# Patient Record
Sex: Male | Born: 1935 | Race: White | Hispanic: No | Smoking: Former smoker
Health system: Southern US, Community
[De-identification: ages and names within clinical notes are randomized; demographics above are authoritative.]

## PROBLEM LIST (undated history)

## (undated) DIAGNOSIS — H9190 Unspecified hearing loss, unspecified ear: Secondary | ICD-10-CM

## (undated) DIAGNOSIS — IMO0002 Reserved for concepts with insufficient information to code with codable children: Secondary | ICD-10-CM

## (undated) DIAGNOSIS — D62 Acute posthemorrhagic anemia: Secondary | ICD-10-CM

## (undated) DIAGNOSIS — D649 Anemia, unspecified: Secondary | ICD-10-CM

## (undated) DIAGNOSIS — S72142A Displaced intertrochanteric fracture of left femur, initial encounter for closed fracture: Secondary | ICD-10-CM

## (undated) DIAGNOSIS — M48061 Spinal stenosis, lumbar region without neurogenic claudication: Secondary | ICD-10-CM

## (undated) DIAGNOSIS — R609 Edema, unspecified: Secondary | ICD-10-CM

## (undated) DIAGNOSIS — R7989 Other specified abnormal findings of blood chemistry: Secondary | ICD-10-CM

## (undated) DIAGNOSIS — N400 Enlarged prostate without lower urinary tract symptoms: Secondary | ICD-10-CM

## (undated) DIAGNOSIS — I517 Cardiomegaly: Secondary | ICD-10-CM

## (undated) DIAGNOSIS — M810 Age-related osteoporosis without current pathological fracture: Secondary | ICD-10-CM

## (undated) DIAGNOSIS — E079 Disorder of thyroid, unspecified: Secondary | ICD-10-CM

## (undated) DIAGNOSIS — E039 Hypothyroidism, unspecified: Secondary | ICD-10-CM

## (undated) DIAGNOSIS — I10 Essential (primary) hypertension: Secondary | ICD-10-CM

## (undated) HISTORY — DX: Other specified abnormal findings of blood chemistry: R79.89

## (undated) HISTORY — PX: FOOT SURGERY: SHX648

## (undated) HISTORY — DX: Acute posthemorrhagic anemia: D62

## (undated) HISTORY — DX: Edema, unspecified: R60.9

## (undated) HISTORY — DX: Anemia, unspecified: D64.9

## (undated) HISTORY — DX: Unspecified hearing loss, unspecified ear: H91.90

## (undated) HISTORY — DX: Hypothyroidism, unspecified: E03.9

## (undated) HISTORY — DX: Cardiomegaly: I51.7

## (undated) HISTORY — DX: Age-related osteoporosis without current pathological fracture: M81.0

## (undated) HISTORY — DX: Reserved for concepts with insufficient information to code with codable children: IMO0002

## (undated) HISTORY — DX: Spinal stenosis, lumbar region without neurogenic claudication: M48.061

---

## 2001-02-18 ENCOUNTER — Encounter: Payer: Self-pay | Admitting: Orthopaedic Surgery

## 2001-02-18 ENCOUNTER — Ambulatory Visit (HOSPITAL_COMMUNITY): Admission: RE | Admit: 2001-02-18 | Discharge: 2001-02-18 | Payer: Self-pay | Admitting: Orthopaedic Surgery

## 2001-02-28 ENCOUNTER — Encounter: Payer: Self-pay | Admitting: Orthopedic Surgery

## 2001-02-28 ENCOUNTER — Encounter: Admission: RE | Admit: 2001-02-28 | Discharge: 2001-02-28 | Payer: Self-pay | Admitting: Orthopedic Surgery

## 2001-03-11 ENCOUNTER — Encounter: Admission: RE | Admit: 2001-03-11 | Discharge: 2001-03-11 | Payer: Self-pay | Admitting: Orthopedic Surgery

## 2001-03-11 ENCOUNTER — Encounter: Payer: Self-pay | Admitting: Orthopedic Surgery

## 2001-04-20 ENCOUNTER — Encounter: Payer: Self-pay | Admitting: Orthopedic Surgery

## 2001-04-20 ENCOUNTER — Encounter: Admission: RE | Admit: 2001-04-20 | Discharge: 2001-04-20 | Payer: Self-pay | Admitting: Orthopedic Surgery

## 2004-08-20 ENCOUNTER — Ambulatory Visit (HOSPITAL_COMMUNITY): Admission: RE | Admit: 2004-08-20 | Discharge: 2004-08-20 | Payer: Self-pay | Admitting: Gastroenterology

## 2004-08-20 ENCOUNTER — Encounter (INDEPENDENT_AMBULATORY_CARE_PROVIDER_SITE_OTHER): Payer: Self-pay | Admitting: Specialist

## 2007-12-16 ENCOUNTER — Encounter: Admission: RE | Admit: 2007-12-16 | Discharge: 2008-01-18 | Payer: Self-pay | Admitting: Ophthalmology

## 2008-08-22 ENCOUNTER — Encounter: Admission: RE | Admit: 2008-08-22 | Discharge: 2008-08-22 | Payer: Self-pay | Admitting: Orthopedic Surgery

## 2010-10-08 ENCOUNTER — Other Ambulatory Visit: Payer: Self-pay | Admitting: Dermatology

## 2011-01-02 NOTE — Op Note (Signed)
[name], [name]                 ACCOUNT NO.:  [id number]   MEDICAL RECORD NO.:  [id number]          PATIENT TYPE:  AMB   LOCATION:  ENDO                         FACILITY:  [facility]   PHYSICIAN:  Danise Edge, M.D.   DATE OF BIRTH:  1918-07-15   DATE OF PROCEDURE:  08/20/2004  DATE OF DISCHARGE:                                 OPERATIVE REPORT   PROCEDURE INDICATION:  Mr. Cory Burton is an 77 year old male born Dec 13, 1917.  Mr. Cory Burton is scheduled to undergo his first screening colonoscopy  with polypectomy to prevent colon cancer.   ENDOSCOPIST:  Danise Edge, M.D.   PREMEDICATION:  1.  Versed 4 mg.  2.  Demerol 50 mg.   PROCEDURE:  After obtaining informed consent, Mr. Cory Burton was placed in the  left lateral decubitus position.  I administered intravenous Versed and  intravenous Demerol to achieve conscious sedation for the procedure.  The  patient's blood pressure, oxygen saturation, and cardiac rhythm were  monitored throughout the procedure and documented in the medical record.   Anal inspection and digital rectal exam were normal.  The prostate was non-  nodular.  The Olympus adjustable pediatric colonoscope was introduced into  the rectum and advanced to the cecum.  Colonic preparation for the exam  today was excellent.   Rectum:  Four diminutive hyperplastic-appearing polyps were removed with the  cold biopsy forceps from the mid-distal rectum.   Sigmoid colon and descending colon:  Extensive left colonic diverticulosis.   Splenic flexure:  Normal.   Transverse colon:  Normal.   Hepatic flexure:  Normal.   Ascending colon:  Normal.   Cecum and ileocecal valve:  Normal.   ASSESSMENT:  1.  Four diminutive hyperplastic-appearing polyps were removed from the      rectum.  2.  Extensive left colonic diverticulosis.      MJ/MEDQ  D:  08/20/2004  T:  08/20/2004  Job:  [id number]   cc:   Marcene Duos, M.D.  [place]  [place]  [place] [address]  Fax: [phone]

## 2011-12-01 DIAGNOSIS — L570 Actinic keratosis: Secondary | ICD-10-CM | POA: Diagnosis not present

## 2011-12-16 ENCOUNTER — Emergency Department (HOSPITAL_COMMUNITY): Payer: Medicare Other

## 2011-12-16 ENCOUNTER — Encounter (HOSPITAL_COMMUNITY): Payer: Self-pay | Admitting: General Practice

## 2011-12-16 ENCOUNTER — Inpatient Hospital Stay (HOSPITAL_COMMUNITY)
Admission: EM | Admit: 2011-12-16 | Discharge: 2011-12-21 | DRG: 481 | Disposition: A | Discharge status: Skilled Nursing Facility | Payer: Medicare Other | Attending: Orthopedic Surgery | Admitting: Orthopedic Surgery

## 2011-12-16 DIAGNOSIS — R6889 Other general symptoms and signs: Secondary | ICD-10-CM | POA: Diagnosis not present

## 2011-12-16 DIAGNOSIS — S72143A Displaced intertrochanteric fracture of unspecified femur, initial encounter for closed fracture: Principal | ICD-10-CM | POA: Diagnosis present

## 2011-12-16 DIAGNOSIS — D62 Acute posthemorrhagic anemia: Secondary | ICD-10-CM | POA: Diagnosis not present

## 2011-12-16 DIAGNOSIS — M21959 Unspecified acquired deformity of unspecified thigh: Secondary | ICD-10-CM | POA: Diagnosis not present

## 2011-12-16 DIAGNOSIS — S72142A Displaced intertrochanteric fracture of left femur, initial encounter for closed fracture: Secondary | ICD-10-CM | POA: Diagnosis present

## 2011-12-16 DIAGNOSIS — I10 Essential (primary) hypertension: Secondary | ICD-10-CM | POA: Diagnosis not present

## 2011-12-16 DIAGNOSIS — M25559 Pain in unspecified hip: Secondary | ICD-10-CM | POA: Diagnosis not present

## 2011-12-16 DIAGNOSIS — R52 Pain, unspecified: Secondary | ICD-10-CM | POA: Diagnosis not present

## 2011-12-16 DIAGNOSIS — E039 Hypothyroidism, unspecified: Secondary | ICD-10-CM | POA: Diagnosis present

## 2011-12-16 DIAGNOSIS — T148XXA Other injury of unspecified body region, initial encounter: Secondary | ICD-10-CM | POA: Diagnosis not present

## 2011-12-16 DIAGNOSIS — W010XXA Fall on same level from slipping, tripping and stumbling without subsequent striking against object, initial encounter: Secondary | ICD-10-CM | POA: Diagnosis present

## 2011-12-16 DIAGNOSIS — Z87891 Personal history of nicotine dependence: Secondary | ICD-10-CM

## 2011-12-16 HISTORY — DX: Benign prostatic hyperplasia without lower urinary tract symptoms: N40.0

## 2011-12-16 HISTORY — DX: Displaced intertrochanteric fracture of left femur, initial encounter for closed fracture: S72.142A

## 2011-12-16 HISTORY — DX: Disorder of thyroid, unspecified: E07.9

## 2011-12-16 HISTORY — DX: Essential (primary) hypertension: I10

## 2011-12-16 NOTE — ED Notes (Addendum)
Per EMS, patient stumbled in dark, fell.  On arrival, patient was lying in bed, pain 10/10 in left hip, no loss of consciousness.  No n/v, no dizziness. 100 mcg of Fentanyl given per EMS.  Left lower extremity shortening and outward rotation noted.  CMS intact.

## 2011-12-16 NOTE — ED Notes (Signed)
[id number]<AV> Expected date:<BR> Expected time:<BR> Means of arrival:<BR> Comments:<BR> From [facility] Home-fall-hip pain/shortening

## 2011-12-17 ENCOUNTER — Inpatient Hospital Stay (HOSPITAL_COMMUNITY): Payer: Medicare Other | Admitting: Anesthesiology

## 2011-12-17 ENCOUNTER — Encounter (HOSPITAL_COMMUNITY): Payer: Self-pay | Admitting: Anesthesiology

## 2011-12-17 ENCOUNTER — Encounter (HOSPITAL_COMMUNITY)
Admission: EM | Disposition: A | Discharge status: Skilled Nursing Facility | Payer: Self-pay | Source: Home / Self Care | Attending: Orthopedic Surgery

## 2011-12-17 ENCOUNTER — Encounter (HOSPITAL_COMMUNITY): Payer: Self-pay | Admitting: Emergency Medicine

## 2011-12-17 ENCOUNTER — Inpatient Hospital Stay (HOSPITAL_COMMUNITY): Payer: Medicare Other

## 2011-12-17 ENCOUNTER — Encounter (HOSPITAL_COMMUNITY): Payer: Self-pay | Admitting: Orthopedic Surgery

## 2011-12-17 ENCOUNTER — Emergency Department (HOSPITAL_COMMUNITY): Payer: Medicare Other

## 2011-12-17 DIAGNOSIS — Z87891 Personal history of nicotine dependence: Secondary | ICD-10-CM | POA: Diagnosis not present

## 2011-12-17 DIAGNOSIS — M25559 Pain in unspecified hip: Secondary | ICD-10-CM | POA: Diagnosis not present

## 2011-12-17 DIAGNOSIS — N400 Enlarged prostate without lower urinary tract symptoms: Secondary | ICD-10-CM | POA: Diagnosis not present

## 2011-12-17 DIAGNOSIS — S7223XA Displaced subtrochanteric fracture of unspecified femur, initial encounter for closed fracture: Secondary | ICD-10-CM | POA: Diagnosis not present

## 2011-12-17 DIAGNOSIS — S72142A Displaced intertrochanteric fracture of left femur, initial encounter for closed fracture: Secondary | ICD-10-CM

## 2011-12-17 DIAGNOSIS — S72143A Displaced intertrochanteric fracture of unspecified femur, initial encounter for closed fracture: Secondary | ICD-10-CM | POA: Diagnosis not present

## 2011-12-17 DIAGNOSIS — E039 Hypothyroidism, unspecified: Secondary | ICD-10-CM | POA: Diagnosis not present

## 2011-12-17 DIAGNOSIS — S72009A Fracture of unspecified part of neck of unspecified femur, initial encounter for closed fracture: Secondary | ICD-10-CM | POA: Diagnosis not present

## 2011-12-17 DIAGNOSIS — D62 Acute posthemorrhagic anemia: Secondary | ICD-10-CM | POA: Diagnosis not present

## 2011-12-17 DIAGNOSIS — R269 Unspecified abnormalities of gait and mobility: Secondary | ICD-10-CM | POA: Diagnosis not present

## 2011-12-17 DIAGNOSIS — M21959 Unspecified acquired deformity of unspecified thigh: Secondary | ICD-10-CM | POA: Diagnosis not present

## 2011-12-17 DIAGNOSIS — I10 Essential (primary) hypertension: Secondary | ICD-10-CM | POA: Diagnosis not present

## 2011-12-17 DIAGNOSIS — Z01811 Encounter for preprocedural respiratory examination: Secondary | ICD-10-CM | POA: Diagnosis not present

## 2011-12-17 HISTORY — DX: Displaced intertrochanteric fracture of left femur, initial encounter for closed fracture: S72.142A

## 2011-12-17 HISTORY — PX: COMPRESSION HIP SCREW: SHX1386

## 2011-12-17 LAB — URINALYSIS, ROUTINE W REFLEX MICROSCOPIC
Bilirubin Urine: NEGATIVE
Glucose, UA: NEGATIVE mg/dL
Hgb urine dipstick: NEGATIVE
Nitrite: NEGATIVE
Protein, ur: NEGATIVE mg/dL
Specific Gravity, Urine: 1.018 (ref 1.005–1.030)
Urobilinogen, UA: 0.2 mg/dL (ref 0.0–1.0)
pH: 7 (ref 5.0–8.0)

## 2011-12-17 LAB — DIFFERENTIAL
Basophils Absolute: 0 10*3/uL (ref 0.0–0.1)
Basophils Relative: 0 % (ref 0–1)
Eosinophils Absolute: 0 10*3/uL (ref 0.0–0.7)
Eosinophils Relative: 0 % (ref 0–5)
Lymphocytes Relative: 8 % — ABNORMAL LOW (ref 12–46)
Lymphs Abs: 1.2 10*3/uL (ref 0.7–4.0)
Monocytes Absolute: 1.1 10*3/uL — ABNORMAL HIGH (ref 0.1–1.0)
Monocytes Relative: 7 % (ref 3–12)
Neutro Abs: 13.1 10*3/uL — ABNORMAL HIGH (ref 1.7–7.7)
Neutrophils Relative %: 85 % — ABNORMAL HIGH (ref 43–77)

## 2011-12-17 LAB — COMPREHENSIVE METABOLIC PANEL
ALT: 13 U/L (ref 0–53)
AST: 25 U/L (ref 0–37)
Albumin: 3.9 g/dL (ref 3.5–5.2)
Alkaline Phosphatase: 58 U/L (ref 39–117)
BUN: 18 mg/dL (ref 6–23)
CO2: 23 mEq/L (ref 19–32)
Calcium: 9.5 mg/dL (ref 8.4–10.5)
Chloride: 99 mEq/L (ref 96–112)
Creatinine, Ser: 1 mg/dL (ref 0.50–1.35)
GFR calc Af Amer: 74 mL/min — ABNORMAL LOW (ref >90)
GFR calc non Af Amer: 64 mL/min — ABNORMAL LOW (ref >90)
Glucose, Bld: 128 mg/dL — ABNORMAL HIGH (ref 70–99)
Potassium: 3.6 mEq/L (ref 3.5–5.1)
Sodium: 135 mEq/L (ref 135–145)
Total Bilirubin: 0.4 mg/dL (ref 0.3–1.2)
Total Protein: 6.6 g/dL (ref 6.0–8.3)

## 2011-12-17 LAB — URINE MICROSCOPIC-ADD ON

## 2011-12-17 LAB — ABO/RH: ABO/RH(D): O POS

## 2011-12-17 LAB — SURGICAL PCR SCREEN
MRSA, PCR: NEGATIVE
Staphylococcus aureus: NEGATIVE

## 2011-12-17 LAB — CBC
HCT: 34.6 % — ABNORMAL LOW (ref 39.0–52.0)
Hemoglobin: 12.2 g/dL — ABNORMAL LOW (ref 13.0–17.0)
MCH: 34.5 pg — ABNORMAL HIGH (ref 26.0–34.0)
MCHC: 35.3 g/dL (ref 30.0–36.0)
MCV: 97.7 fL (ref 78.0–100.0)
Platelets: 216 10*3/uL (ref 150–400)
RBC: 3.54 MIL/uL — ABNORMAL LOW (ref 4.22–5.81)
RDW: 12.4 % (ref 11.5–15.5)
WBC: 15.4 10*3/uL — ABNORMAL HIGH (ref 4.0–10.5)

## 2011-12-17 LAB — TYPE AND SCREEN
ABO/RH(D): O POS
Antibody Screen: NEGATIVE

## 2011-12-17 SURGERY — COMPRESSION HIP
Anesthesia: General | Site: Hip | Laterality: Left | Wound class: Clean

## 2011-12-17 MED ORDER — COUMADIN BOOK
1.0000 | Freq: Once | Status: AC
  Administered 2011-12-18: 1
  Filled 2011-12-17: qty 1

## 2011-12-17 MED ORDER — METOCLOPRAMIDE HCL 10 MG PO TABS: 5.0000 mg | ORAL_TABLET | Freq: Three times a day (TID) | ORAL | Status: DC | PRN

## 2011-12-17 MED ORDER — PHENOL 1.4 % MT LIQD
1.0000 | OROMUCOSAL | Status: DC | PRN
  Filled 2011-12-17: qty 177

## 2011-12-17 MED ORDER — SENNA 8.6 MG PO TABS
1.0000 | ORAL_TABLET | Freq: Two times a day (BID) | ORAL | Status: DC
  Administered 2011-12-18 – 2011-12-21 (×6): 8.6 mg via ORAL
  Filled 2011-12-17 (×5): qty 1

## 2011-12-17 MED ORDER — DOCUSATE SODIUM 100 MG PO CAPS
100.0000 mg | ORAL_CAPSULE | Freq: Two times a day (BID) | ORAL | Status: DC
  Administered 2011-12-18 – 2011-12-21 (×7): 100 mg via ORAL
  Filled 2011-12-17 (×9): qty 1

## 2011-12-17 MED ORDER — LOSARTAN POTASSIUM 25 MG PO TABS
25.0000 mg | ORAL_TABLET | Freq: Every day | ORAL | Status: DC
  Administered 2011-12-18 – 2011-12-21 (×4): 25 mg via ORAL
  Filled 2011-12-17 (×5): qty 1

## 2011-12-17 MED ORDER — HYDROCODONE-ACETAMINOPHEN 5-325 MG PO TABS: 1.0000 | ORAL_TABLET | Freq: Four times a day (QID) | ORAL | Status: AC | PRN

## 2011-12-17 MED ORDER — HYDROCODONE-ACETAMINOPHEN 5-325 MG PO TABS: 1.0000 | ORAL_TABLET | ORAL | Status: DC | PRN

## 2011-12-17 MED ORDER — MORPHINE SULFATE 2 MG/ML IJ SOLN
INTRAMUSCULAR | Status: AC
  Administered 2011-12-17: 0.5 mg via INTRAVENOUS
  Filled 2011-12-17: qty 1

## 2011-12-17 MED ORDER — HYDROMORPHONE HCL PF 1 MG/ML IJ SOLN
INTRAMUSCULAR | Status: DC | PRN
  Administered 2011-12-17: 0.5 mg via INTRAVENOUS

## 2011-12-17 MED ORDER — WARFARIN SODIUM 4 MG PO TABS
4.0000 mg | ORAL_TABLET | Freq: Once | ORAL | Status: AC
  Administered 2011-12-18: 4 mg via ORAL
  Filled 2011-12-17: qty 1

## 2011-12-17 MED ORDER — LACTATED RINGERS IV SOLN
INTRAVENOUS | Status: DC | PRN
  Administered 2011-12-17: 17:00:00 via INTRAVENOUS

## 2011-12-17 MED ORDER — ACETAMINOPHEN 650 MG RE SUPP: 650.0000 mg | Freq: Four times a day (QID) | RECTAL | Status: DC | PRN

## 2011-12-17 MED ORDER — PROPOFOL 10 MG/ML IV BOLUS
INTRAVENOUS | Status: DC | PRN
  Administered 2011-12-17: 150 mg via INTRAVENOUS

## 2011-12-17 MED ORDER — DEXAMETHASONE SODIUM PHOSPHATE 10 MG/ML IJ SOLN
INTRAMUSCULAR | Status: DC | PRN
  Administered 2011-12-17: 10 mg via INTRAVENOUS

## 2011-12-17 MED ORDER — WARFARIN SODIUM 5 MG PO TABS: 5.0000 mg | ORAL_TABLET | Freq: Every day | ORAL | Status: DC

## 2011-12-17 MED ORDER — CEFAZOLIN SODIUM 1-5 GM-% IV SOLN
1.0000 g | INTRAVENOUS | Status: AC
  Administered 2011-12-17: 1 g via INTRAVENOUS

## 2011-12-17 MED ORDER — ALUM & MAG HYDROXIDE-SIMETH 200-200-20 MG/5ML PO SUSP: 30.0000 mL | ORAL | Status: DC | PRN

## 2011-12-17 MED ORDER — MORPHINE SULFATE 2 MG/ML IJ SOLN
0.5000 mg | INTRAMUSCULAR | Status: DC | PRN
  Administered 2011-12-17 (×2): 0.5 mg via INTRAVENOUS
  Filled 2011-12-17: qty 1

## 2011-12-17 MED ORDER — METHOCARBAMOL 500 MG PO TABS
500.0000 mg | ORAL_TABLET | Freq: Four times a day (QID) | ORAL | Status: DC | PRN
  Administered 2011-12-18 – 2011-12-19 (×3): 500 mg via ORAL
  Filled 2011-12-17 (×3): qty 1

## 2011-12-17 MED ORDER — CEFAZOLIN SODIUM 1-5 GM-% IV SOLN
INTRAVENOUS | Status: AC
  Filled 2011-12-17: qty 50

## 2011-12-17 MED ORDER — ENOXAPARIN SODIUM 40 MG/0.4ML ~~LOC~~ SOLN
40.0000 mg | SUBCUTANEOUS | Status: DC
  Administered 2011-12-18 – 2011-12-21 (×4): 40 mg via SUBCUTANEOUS
  Filled 2011-12-17 (×7): qty 0.4

## 2011-12-17 MED ORDER — MENTHOL 3 MG MT LOZG
1.0000 | LOZENGE | OROMUCOSAL | Status: DC | PRN
  Filled 2011-12-17: qty 9

## 2011-12-17 MED ORDER — WARFARIN VIDEO
Freq: Once | Status: AC
  Administered 2011-12-18: 12:00:00

## 2011-12-17 MED ORDER — LABETALOL HCL 5 MG/ML IV SOLN
INTRAVENOUS | Status: AC
  Filled 2011-12-17: qty 4

## 2011-12-17 MED ORDER — ROCURONIUM BROMIDE 100 MG/10ML IV SOLN
INTRAVENOUS | Status: DC | PRN
  Administered 2011-12-17: 5 mg via INTRAVENOUS
  Administered 2011-12-17: 35 mg via INTRAVENOUS
  Administered 2011-12-17: 10 mg via INTRAVENOUS
  Administered 2011-12-17: 15 mg via INTRAVENOUS

## 2011-12-17 MED ORDER — GLYCOPYRROLATE 0.2 MG/ML IJ SOLN
INTRAMUSCULAR | Status: DC | PRN
  Administered 2011-12-17: .5 mg via INTRAVENOUS

## 2011-12-17 MED ORDER — FENTANYL CITRATE 0.05 MG/ML IJ SOLN
INTRAMUSCULAR | Status: DC | PRN
  Administered 2011-12-17: 100 ug via INTRAVENOUS

## 2011-12-17 MED ORDER — METHOCARBAMOL 100 MG/ML IJ SOLN
500.0000 mg | Freq: Four times a day (QID) | INTRAVENOUS | Status: DC | PRN
  Filled 2011-12-17: qty 5

## 2011-12-17 MED ORDER — POTASSIUM CHLORIDE IN NACL 20-0.45 MEQ/L-% IV SOLN
INTRAVENOUS | Status: DC
  Administered 2011-12-17: 06:00:00 via INTRAVENOUS
  Administered 2011-12-17: 900 mL via INTRAVENOUS
  Filled 2011-12-17 (×3): qty 1000

## 2011-12-17 MED ORDER — FINASTERIDE 5 MG PO TABS
5.0000 mg | ORAL_TABLET | Freq: Every day | ORAL | Status: DC
  Administered 2011-12-18 – 2011-12-21 (×4): 5 mg via ORAL
  Filled 2011-12-17 (×5): qty 1

## 2011-12-17 MED ORDER — METHOCARBAMOL 500 MG PO TABS: 500.0000 mg | ORAL_TABLET | Freq: Four times a day (QID) | ORAL | Status: DC | PRN

## 2011-12-17 MED ORDER — ONDANSETRON HCL 4 MG PO TABS: 4.0000 mg | ORAL_TABLET | Freq: Four times a day (QID) | ORAL | Status: DC | PRN

## 2011-12-17 MED ORDER — METHOCARBAMOL 100 MG/ML IJ SOLN
500.0000 mg | Freq: Four times a day (QID) | INTRAMUSCULAR | Status: DC | PRN
  Filled 2011-12-17: qty 5

## 2011-12-17 MED ORDER — ESMOLOL HCL 10 MG/ML IV SOLN
INTRAVENOUS | Status: DC | PRN
  Administered 2011-12-17: 15 mg via INTRAVENOUS

## 2011-12-17 MED ORDER — ZOLPIDEM TARTRATE 5 MG PO TABS: 5.0000 mg | ORAL_TABLET | Freq: Every evening | ORAL | Status: DC | PRN

## 2011-12-17 MED ORDER — LACTATED RINGERS IV SOLN
INTRAVENOUS | Status: DC
  Administered 2011-12-17 (×2): via INTRAVENOUS

## 2011-12-17 MED ORDER — ACETAMINOPHEN 325 MG PO TABS: 650.0000 mg | ORAL_TABLET | Freq: Four times a day (QID) | ORAL | Status: DC | PRN

## 2011-12-17 MED ORDER — POTASSIUM CHLORIDE IN NACL 20-0.45 MEQ/L-% IV SOLN
INTRAVENOUS | Status: DC
  Administered 2011-12-18 (×2): via INTRAVENOUS
  Filled 2011-12-17 (×6): qty 1000

## 2011-12-17 MED ORDER — CEFAZOLIN SODIUM 1-5 GM-% IV SOLN
1.0000 g | Freq: Four times a day (QID) | INTRAVENOUS | Status: AC
  Administered 2011-12-18 (×3): 1 g via INTRAVENOUS
  Filled 2011-12-17 (×3): qty 50

## 2011-12-17 MED ORDER — CALCIUM CARBONATE-VITAMIN D 500-200 MG-UNIT PO TABS: 1.0000 | ORAL_TABLET | Freq: Every day | ORAL | Status: DC

## 2011-12-17 MED ORDER — LABETALOL HCL 5 MG/ML IV SOLN
5.0000 mg | INTRAVENOUS | Status: DC | PRN
  Administered 2011-12-17: 5 mg via INTRAVENOUS

## 2011-12-17 MED ORDER — HYDROMORPHONE HCL PF 1 MG/ML IJ SOLN
0.2500 mg | INTRAMUSCULAR | Status: DC | PRN
  Administered 2011-12-17: 0.25 mg via INTRAVENOUS

## 2011-12-17 MED ORDER — HYDROMORPHONE HCL PF 1 MG/ML IJ SOLN
INTRAMUSCULAR | Status: AC
  Filled 2011-12-17: qty 1

## 2011-12-17 MED ORDER — WARFARIN - PHARMACIST DOSING INPATIENT
Freq: Every day | Status: DC
  Administered 2011-12-18: 18:00:00

## 2011-12-17 MED ORDER — TAMSULOSIN HCL 0.4 MG PO CAPS
0.4000 mg | ORAL_CAPSULE | Freq: Every day | ORAL | Status: DC
  Administered 2011-12-18 – 2011-12-21 (×4): 0.4 mg via ORAL
  Filled 2011-12-17 (×5): qty 1

## 2011-12-17 MED ORDER — METOCLOPRAMIDE HCL 5 MG/ML IJ SOLN: 5.0000 mg | Freq: Three times a day (TID) | INTRAMUSCULAR | Status: DC | PRN

## 2011-12-17 MED ORDER — ONDANSETRON HCL 4 MG/2ML IJ SOLN: 4.0000 mg | Freq: Four times a day (QID) | INTRAMUSCULAR | Status: DC | PRN

## 2011-12-17 MED ORDER — LIDOCAINE HCL (CARDIAC) 20 MG/ML IV SOLN
INTRAVENOUS | Status: DC | PRN
  Administered 2011-12-17: 50 mg via INTRAVENOUS

## 2011-12-17 MED ORDER — LEVOTHYROXINE SODIUM 137 MCG PO TABS
137.0000 ug | ORAL_TABLET | Freq: Every day | ORAL | Status: DC
  Administered 2011-12-18 – 2011-12-21 (×4): 137 ug via ORAL
  Filled 2011-12-17 (×5): qty 1

## 2011-12-17 MED ORDER — MORPHINE SULFATE 2 MG/ML IJ SOLN
0.5000 mg | INTRAMUSCULAR | Status: DC | PRN
Start: 2011-12-17 — End: 2011-12-21

## 2011-12-17 MED ORDER — HYDROCODONE-ACETAMINOPHEN 5-325 MG PO TABS
1.0000 | ORAL_TABLET | ORAL | Status: DC | PRN
  Administered 2011-12-18 (×2): 1 via ORAL
  Filled 2011-12-17 (×2): qty 1

## 2011-12-17 MED ORDER — 0.9 % SODIUM CHLORIDE (POUR BTL) OPTIME
TOPICAL | Status: DC | PRN
  Administered 2011-12-17: 1000 mL

## 2011-12-17 MED ORDER — ONDANSETRON HCL 4 MG/2ML IJ SOLN
INTRAMUSCULAR | Status: DC | PRN
  Administered 2011-12-17: 4 mg via INTRAVENOUS

## 2011-12-17 MED ORDER — NEOSTIGMINE METHYLSULFATE 1 MG/ML IJ SOLN
INTRAMUSCULAR | Status: DC | PRN
  Administered 2011-12-17: 3 mg via INTRAVENOUS

## 2011-12-17 SURGICAL SUPPLY — 44 items
APL SKNCLS STERI-STRIP NONHPOA (GAUZE/BANDAGES/DRESSINGS) ×1
BAG ZIPLOCK 12X15 (MISCELLANEOUS) IMPLANT
BENZOIN TINCTURE PRP APPL 2/3 (GAUZE/BANDAGES/DRESSINGS) ×2 IMPLANT
BIT DRILL Q/COUPLING 1 (BIT) ×2 IMPLANT
BLADE HEX COATED 2.75 (ELECTRODE) ×2 IMPLANT
BNDG COHESIVE 6X5 TAN STRL LF (GAUZE/BANDAGES/DRESSINGS) ×2 IMPLANT
CANISTER SUCTION 2500CC (MISCELLANEOUS) ×2 IMPLANT
CLOTH BEACON ORANGE TIMEOUT ST (SAFETY) ×2 IMPLANT
CLSR STERI-STRIP ANTIMIC 1/2X4 (GAUZE/BANDAGES/DRESSINGS) ×2 IMPLANT
DRAPE STERI IOBAN 125X83 (DRAPES) ×2 IMPLANT
DRSG MEPILEX BORDER 4X8 (GAUZE/BANDAGES/DRESSINGS) ×2 IMPLANT
DRSG PAD ABDOMINAL 8X10 ST (GAUZE/BANDAGES/DRESSINGS) ×2 IMPLANT
ELECT REM PT RETURN 9FT ADLT (ELECTROSURGICAL) ×2
ELECTRODE REM PT RTRN 9FT ADLT (ELECTROSURGICAL) ×1 IMPLANT
EVACUATOR 1/8 PVC DRAIN (DRAIN) IMPLANT
FACESHIELD LNG OPTICON STERILE (SAFETY) IMPLANT
GAUZE XEROFORM 5X9 LF (GAUZE/BANDAGES/DRESSINGS) IMPLANT
GLOVE BIOGEL PI IND STRL 8 (GLOVE) ×1 IMPLANT
GLOVE BIOGEL PI INDICATOR 8 (GLOVE) ×1
GLOVE ORTHO TXT STRL SZ7.5 (GLOVE) ×4 IMPLANT
GOWN STRL NON-REIN LRG LVL3 (GOWN DISPOSABLE) ×2 IMPLANT
KIT BASIN OR (CUSTOM PROCEDURE TRAY) ×2 IMPLANT
NS IRRIG 1000ML POUR BTL (IV SOLUTION) ×2 IMPLANT
PACK GENERAL/GYN (CUSTOM PROCEDURE TRAY) ×2 IMPLANT
PLATE DHS 135 DEG/4HOLE (Plate) ×2 IMPLANT
POSITIONER SURGICAL ARM (MISCELLANEOUS) ×4 IMPLANT
REAMER ROD DEEP FLUTE 2.5X950 (INSTRUMENTS) ×2 IMPLANT
SCREW CORTEX ST 4.5X36 (Screw) ×2 IMPLANT
SCREW CORTEX ST 4.5X38 (Screw) ×2 IMPLANT
SCREW CORTEX ST 4.5X40 (Screw) ×2 IMPLANT
SCREW CORTEX ST 4.5X42 (Screw) ×2 IMPLANT
SCREW DHS LAG 100MM (Screw) ×2 IMPLANT
SPONGE GAUZE 4X4 12PLY (GAUZE/BANDAGES/DRESSINGS) IMPLANT
STAPLER VISISTAT 35W (STAPLE) IMPLANT
SUT MNCRL AB 4-0 PS2 18 (SUTURE) IMPLANT
SUT VIC AB 0 CT2 27 (SUTURE) IMPLANT
SUT VIC AB 2-0 CT1 27 (SUTURE) ×2
SUT VIC AB 2-0 CT1 TAPERPNT 27 (SUTURE) ×1 IMPLANT
SUT VIC AB 2-0 FS1 27 (SUTURE) IMPLANT
SUT VIC AB 3-0 SH 27 (SUTURE)
SUT VIC AB 3-0 SH 27X BRD (SUTURE) IMPLANT
SUT VIC AB 3-0 SH 8-18 (SUTURE) ×2 IMPLANT
TOWEL OR 17X26 10 PK STRL BLUE (TOWEL DISPOSABLE) ×4 IMPLANT
WATER STERILE IRR 1500ML POUR (IV SOLUTION) IMPLANT

## 2011-12-17 NOTE — Progress Notes (Signed)
Pt's bp elevated, dr Council Mechanic in and orders obtained

## 2011-12-17 NOTE — Op Note (Signed)
DATE OF SURGERY:  12/17/2011  TIME: 6:54 PM  PATIENT NAME:  Cory Burton  AGE: 78 y.o.  PRE-OPERATIVE DIAGNOSIS:  left intertrochanteric hip fracture  POST-OPERATIVE DIAGNOSIS:  SAME  PROCEDURE:  open reduction internal fixation intertrochanteric hip fracture using compression screw and sideplate.  SURGEON:  Chasen Mendell P  ASSISTANT:  Janace Litten, OPA-C, present and scrubbed throughout the case, critical for assistance with exposure, retraction, instrumentation, and closure.  OPERATIVE IMPLANTS: Synthes Dynamic Hip Screw with a cephalomedullary lag screw and distal cortical interlocking screws.  PREOPERATIVE INDICATIONS:  Cory Burton is a 78 y.o. year old who fell and suffered a hip fracture. He was brought into the ER and then admitted and optimized and then elected for surgical intervention.    The risks benefits and alternatives were discussed with the patient and the family including but not limited to the risks of nonoperative treatment, versus surgical intervention including infection, bleeding, nerve injury, malunion, nonunion, hardware prominence, hardware failure, need for hardware removal, blood clots, cardiopulmonary complications, morbidity, mortality, among others, and they were willing to proceed.  We also discussed the risks for the need for conversion to arthroplasty, periprosthetic fracture, among others.   OPERATIVE PROCEDURE:  The patient was brought to the operating room and placed in the supine position. General anesthesia was administered, with a foley. He was placed on the fracture table.  Closed reduction was performed under C-arm guidance. Time out was then performed after sterile prep and drape. He received preoperative antibiotics.  Incision was made over the proximal femur. The iliotibial band was incised, and the vastus lateralis elevated. Deep retractors were placed. A 135 mm guide was utilized to place the guidewire was placed in the appropriate position.  Confirmation was made on AP and lateral views. I then drilled over the guidewire to the appropriate length. I gently tapped the humeral head, and then placed the lag screw.  The side plate was applied, using the long barrel plate. This was secured down to the bone, and then fixed with cortical screws distally.  Bone quality was mediocre. Confirmation of appropriate position of the reduction of the fracture as well as the hardware was made on AP and lateral views.  Anatomic reconstruction was achieved, and the wounds were irrigated copiously and closed with Vicryl followed by Steri-Strips and sterile gauze for the skin. The patient was awakened and returned to PACU in stable and satisfactory condition. There no complications and the patient tolerated the procedure well.  He will be weightbearing as tolerated, and will be on Lovenox bridging to Coumadin for a period of one month with a goal INR of 2-3.   Cory Burton, M.D.

## 2011-12-17 NOTE — ED Notes (Signed)
Called report to 6th floor, RN Reita Cliche unable to take report and is to call me back in ten minutes.

## 2011-12-17 NOTE — Progress Notes (Addendum)
ANTICOAGULATION CONSULT NOTE - Initial Consult  Pharmacy Consult for warfarin Indication: VTE prophylaxis  Allergies  Allergen Reactions  . Horse-Derived Products     Horse serum    Patient Measurements: Height: 5\' 9"  (175.3 cm) Weight: 140 lb (63.504 kg) IBW/kg (Calculated) : 70.7  Heparin Dosing Weight:   Vital Signs: Temp: 98.6 F (37 C) (05/02 2200) Temp src: Oral (05/02 2200) BP: 183/83 mmHg (05/02 2200) Pulse Rate: 85  (05/02 2200)  Labs:  Basename 12/17/11 0133  HGB 12.2*  HCT 34.6*  PLT 216  APTT --  LABPROT --  INR --  HEPARINUNFRC --  CREATININE 1.00  CKTOTAL --  CKMB --  TROPONINI --   Estimated Creatinine Clearance: 43.2 ml/min (by C-G formula based on Cr of 1).  Medical History: Past Medical History  Diagnosis Date  . Thyroid disease   . Hypertension   . BPH (benign prostatic hyperplasia)   . Fracture, intertrochanteric, left femur 12/17/2011    Medications:  Prescriptions prior to admission  Medication Sig Dispense Refill  . finasteride (PROSCAR) 5 MG tablet Take 5 mg by mouth daily.      [name] levothyroxine (SYNTHROID, LEVOTHROID) 137 MCG tablet Take 137 mcg by mouth daily.      [name] losartan (COZAAR) 50 MG tablet Take 25 mg by mouth daily.      . Tamsulosin HCl (FLOMAX) 0.4 MG CAPS Take 0.4 mg by mouth daily.        Assessment: Patient status post ortho surgery with orders for pharmacy to dose warfarin for VTE prophylaxis.   Goal of Therapy:  INR 2-3   Plan:  Daily PT/INR, Coumadin Book/video Warfarin 4mg  po x1 at [address] [place]quenette Shone [place] 12/17/2011,11:35 PM   ADDENDUM: 12/18/2011 3:10 PM Patient was education on the use and safety of warfarin.  Patient verbalized understanding.  Clance Boll, PharmD, BCPS Pager: [phone] 12/18/2011 3:10 PM

## 2011-12-17 NOTE — Discharge Instructions (Signed)
Hip Fracture, Open Reduction and Internal Fixation (ORIF) A hip fracture, or broken hip, can happen to anyone. To fix it, surgery is usually needed. One method is called open reduction and internal fixation, or ORIF for short. "Open reduction" means an incision (cut) is made to open the fracture area. This lets the surgeon see the broken bone. The bone pieces will be put back together. Some type of hardware will be used to hold the bones in place. That is called "internal fixation." Screws, pins, rods or a metal plate might be used. More than 250,000 people in the [place] break a hip every year. Nearly all of them are treated successfully with surgery. LET YOUR CAREGIVER KNOW ABOUT : On the day of your surgery, your caregivers will need to know the last time you had anything to eat or drink. This includes water, gum and candy. Also make sure they know about:   Any allergies.   All medications you are taking, including:   Herbs, eyedrops, over-the-counter medications and creams.   Blood thinners (anticoagulants), aspirin or other drugs that could affect blood clotting.   Use of steroids (by mouth or as creams).   Previous problems with anesthesia, including local anesthetics.   Possibility of pregnancy, if this applies.   Any history of blood clots.   Any history of bleeding or other blood problems.   Previous surgery.   Family history of anesthetic complications   Smoking history.   Any recent symptoms of colds or infections.   Other health problems.  RISKS AND COMPLICATIONS  All operations have some risk. Being unhealthy increases risks. That is why you want to be as healthy as possible before this surgery. Possible problems after ORIF may include:  Blood clots.   Bleeding.   Infection near the incision.   Lung infection (pneumonia).   Pain that continues after the operation.   Trouble walking. Some people may need to continue using a walker.  BEFORE THE  PROCEDURE You should be as healthy as possible before surgery for a broken hip. Sometimes this means waiting until other health problems are addressed. Then, the operation can be scheduled. To find out if you are ready for surgery:  A medical evaluation will be done. This examination will include checking your heart and lungs.   Imaging tests. These let the surgeon see what the fracture looks like. They could include:   X-rays to find exactly where the break is.   Computed tomography (CT) scan. A CT scan takes pictures using X-rays and a computer. This can give a better view of the broken hip.   Magnetic resonance imaging (MRI scan). It uses a magnet, radio waves and a computer. It may show a hidden fracture that cannot be seen on X-ray or CT.   Blood tests.   Urine test. It is possible to have a urinary tract infection and not know it.   Talking with an anesthesiologist. This is the person who will be in charge of the anesthesia (medication to stop the pain) during the surgery. An ORIF procedure usually is done with general anesthesia (being asleep during surgery), or a spinal anesthesia is used to make you numb (no feeling) from the waist down but awake during the operation. Ask your surgeon if there is an advantage to one type of anesthetic over the other.  You will need to stop taking certain medicines.  The admitting physician will have you stop using aspirin and non-steroidal anti-inflammatory drugs (NSAIDs) for   pain relief. This includes prescription drugs and over-the-counter drugs such as ibuprofen and naproxen.   If you take blood-thinners, ask your healthcare provider when you should stop taking them.  You will have to give what is called informed consent. This requires signing a legal paper that gives permission for the surgery. To give informed consent:  You must understand how the procedure is done and why.   You must be told all the risks and benefits of the procedure.    You must sign the consent. Or, a legal guardian can do this.   Signing should be witnessed by a healthcare professional.  The day before the surgery, eat only a light dinner. Then, do not eat or drink anything for at least 8 hours before the surgery. Ask if it is OK to take any needed medicines with a sip of water. PROCEDURE The preparation:  Small monitors will be put on your body. They are used to check your heart, blood pressure and oxygen level.   You will be given an intravenous line (IV). A needle will be inserted in your arm. It is hooked to a plastic tube. Medication will be able to flow directly into your body through the IV.   You will be given anesthesia.   For general anesthesia, the anesthesiologist may hold a mask gently over your face. You will breathe in gases that will make you sleep. A tube also might be put in your throat. This would let you continue to get anesthesia during the procedure.   For spinal anesthesia, a drug will be injected (shot) into the spinal cord area. This will make the body numb from the waist down.   The hip area will be scrubbed with a special solution to kill any germs.   The procedure:   Once you are asleep or numb, the surgeon will move the bones (realign the fracture) before any incisions are made. The goal is to get the bones back to their normal position.   X-rays may be taken. This is to check the position of the bones.   An incision is made over the hip. It will go through the muscles to the broken bone.   The bones will be put in place. Some type of hardware will be used to hold the bone together.   The hip is a ball-and-socket joint. The "ball" part of the joint is the very top of the upper leg bone (femur). Sometimes the very top of the upper leg bone is replaced with a man-made piece. If it is replaced, this is called a partial hip replacement. Sometimes a complete or total hip replacement will be preformed, replacing both the  ball and the socket. This is the preferred treatment if there is any appearance of arthritis in the hip joint.   The incision is closed with small stitches or staples.   A dressing (medicine and a bandage) is put over the incision.   An ORIF procedure can take several hours.  AFTER THE PROCEDURE  You will stay in a recovery area until the anesthesia has worn off. Your blood pressure and pulse will be checked every so often. Then you will be taken to a hospital room.   You may continue to get fluids through the IV for awhile.   Some pain is normal after an ORIF procedure. You will probably be given pain medicine. Be sure to tell your caregivers if the pain becomes severe.   It is important to be   up and moving as soon as possible after an operation. Physical therapists will help you start walking. You will probably need to use a walker for a while. Follow the therapists instructions regarding weight bearing on the injured leg.   To prevent blood clots in your legs:   You may be given special stockings to wear.   You may need to take medicine to prevent clots.   Most people stay in the hospital for several days after this surgery.   Physical therapy is usually needed. Some people go to a rehabilitation center (a long-term care center or transitional care unit) before going home. Ask your healthcare providers what would be best for you. Often social workers are available to help you and your family make the best decision for you.  HOME CARE INSTRUCTIONS   Medication.   Take any pain medicine that your surgeon suggests. Follow the directions carefully. Do not take over-the-counter painkillers unless the surgeon says it is OK. Medicine such as aspirin or ibuprofen can increase the chances of bleeding.   Your healthcare provider may prescribe a blood-thinner for several weeks to 2 months. These drugs prevent blood clots.   Wound care.   Check the area around the incision carefully each  day. Look for any redness or swelling. Also check for any fluid that is seeping from the incision. Tell your healthcare provider if you see anything.   Do not get the incision wet until your surgeon says it is OK.   Activity.   Most people will need the help of a walker or crutches for some time.   You will need to continue physical therapy once you are home. This often lasts for several months.   You will learn how to avoid putting stress on your hip while it heals if that is the direction given by your surgeon.   Be sure to do any exercises the therapist suggests. These exercises will help make your hip stronger.   Special equipment might make life at home easier. One example is a seat for the shower. Another is a raised toilet seat.   Ask your healthcare provider when you can resume other activities, such as work, driving or sex.   Follow-up care.   The surgeon may need to take out stitches or staples. This is usually done about two weeks after the operation.   The surgeon will do X-rays to check how your hip is healing.  SEEK MEDICAL CARE IF:   You have any questions about medications.   You feel weak.   You are too tired to walk every day.   Pain continues, even after taking pain medicine.   You develop a fever of more than 100.5 F (38.1 C).  SEEK IMMEDIATE MEDICAL CARE IF:   The incision becomes red or swollen. Or, it bleeds.   Your leg or foot becomes painful and swollen.   Your leg becomes pale or blue. It feels cold. It tingles or is numb.   You have trouble breathing.   You have chest pain.   You develop a fever of more than 102 F (38.9 C).  Document Released: 07/22/2009 Document Revised: 07/23/2011 Document Reviewed: 07/22/2009 [employer] Patient Information 2012 [employer]. 

## 2011-12-17 NOTE — H&P (Signed)
ADMISSION / PREOPERATIVE H&P  Chief Complaint: left intertrochanteric hip fracture  HPI: Cory Burton is a 78 y.o. male who presents after a mechanical fall this evening while trying to walk to his bed in the dark. He had acute onset moderate to severe pain directly over the left hip. He says that he did not have any other injuries in the fall. Did not lose consciousness. He has never had an injury like this before. He does have a past history of a foot metatarsal fracture, which was treated many years ago.  He was brought to the [facility] along emergency room and x-rays were taken. Pain medications have helped with the pain. Rest has also helped.  Past Medical History  Diagnosis Date  . Thyroid disease   . Hypertension   . BPH (benign prostatic hyperplasia)    Past Surgical History  Procedure Date  . Foot surgery    History   Social History  . Marital Status: Married    Spouse Name: N/A    Number of Children: N/A  . Years of Education: N/A   Social History Main Topics  . Smoking status: Former Games developer  . Smokeless tobacco: Not on file  . Alcohol Use: 0.6 oz/week    1 Glasses of wine per week  . Drug Use:   . Sexually Active:    Other Topics Concern  . Not on file   Social History Narrative  . No narrative on file   No family history on file. his father died of a car accident and his mother died of colon cancer at age 75 approximately.  Allergies  Allergen Reactions  . Horse-Derived Products     Horse serum   Prior to Admission medications   Medication Sig Start Date End Date Taking? Authorizing Provider  finasteride (PROSCAR) 5 MG tablet Take 5 mg by mouth daily.   Yes Historical Provider, MD  levothyroxine (SYNTHROID, LEVOTHROID) 137 MCG tablet Take 137 mcg by mouth daily.   Yes Historical Provider, MD  losartan (COZAAR) 50 MG tablet Take 25 mg by mouth daily.   Yes Historical Provider, MD  Tamsulosin HCl (FLOMAX) 0.4 MG CAPS Take 0.4 mg by mouth daily.   Yes Historical  Provider, MD     Positive ROS: All other systems have been reviewed and were otherwise negative with the exception of those mentioned in the HPI and as above.  Physical Exam: General: Alert, no acute distress Cardiovascular: No pedal edema Respiratory: No cyanosis, no use of accessory musculature GI: No organomegaly, abdomen is soft and non-tender Skin: No lesions in the area of chief complaint Neurologic: Sensation intact distally Psychiatric: Patient is competent for consent with normal mood and affect Lymphatic: No axillary or cervical lymphadenopathy  MUSCULOSKELETAL: The left  lower extremity is shortened and externally rotated with bruising laterally. EHL and FHL are firing.   CBC    Component Value Date/Time   WBC 15.4* 12/17/2011 0133   RBC 3.54* 12/17/2011 0133   HGB 12.2* 12/17/2011 0133   HCT 34.6* 12/17/2011 0133   PLT 216 12/17/2011 0133   MCV 97.7 12/17/2011 0133   MCH 34.5* 12/17/2011 0133   MCHC 35.3 12/17/2011 0133   RDW 12.4 12/17/2011 0133   LYMPHSABS 1.2 12/17/2011 0133   MONOABS 1.1* 12/17/2011 0133   EOSABS 0.0 12/17/2011 0133   BASOSABS 0.0 12/17/2011 0133   Dg Hip Complete Left  12/17/2011  *RADIOLOGY REPORT*  Clinical Data: Post fall, now with left hip pain and deformity  LEFT HIP - COMPLETE 2+ VIEW  Comparison: None.  Findings: There is a comminuted, displaced intertrochanteric fracture of the left femur with varus angulation and foreshortening.  No definite pelvic fracture. Mild bilateral hip degenerative change.  Vascular calcifications.  IMPRESSION: Comminuted, displaced intertrochanteric fracture of the left femur with varus angulation and foreshortening.  Original Report Authenticated By: Waynard Reeds, M.D.   Dg Chest Port 1 View  12/17/2011  *RADIOLOGY REPORT*  Clinical Data: Hip fracture, preoperative radiograph  PORTABLE CHEST - 1 VIEW  Comparison: None.  Findings: Cardiomegaly.  Interstitial prominence has a coarsened appearance.  No focal consolidation.  Aortic  atherosclerosis. Osteopenia.  Bilateral glenohumeral and acromioclavicular DJD. Multilevel degenerative changes of the thoracic spine.  IMPRESSION: Coarse interstitial prominence may be chronic.  No focal consolidation.  Original Report Authenticated By: Waneta Martins, M.D.     Assessment: left intertrochanteric hip fracture  Plan: Plan for Procedure(s): COMPRESSION HIP  The risks benefits and alternatives were discussed with the patient including but not limited to the risks of nonoperative treatment, versus surgical intervention including infection, bleeding, nerve injury, malunion, nonunion, the need for revision surgery, hardware prominence, hardware failure, the need for hardware removal, blood clots, cardiopulmonary complications, morbidity, mortality, among others, and they were willing to proceed.    I will plan to admit him tonight, and I also contacted Dr. Madelon Lips, and he has requested, and depending on availability either myself or Dr. Madelon Lips will plan to get his hip fixed later on today. He will have IV pain medications as well as by mouth pain medications as needed, bedrest, Foley catheter, and preoperative antibiotics. Sequential compression devices for DVT prophylaxis. We will plan for chemoprophylaxis after surgery.   Eulas Post, MD 12/17/2011 2:44 AM

## 2011-12-17 NOTE — ED Notes (Signed)
Pedal pulse to L foot obtained by Doppler. Area marked.

## 2011-12-17 NOTE — Transfer of Care (Signed)
Immediate Anesthesia Transfer of Care Note  Patient: Cory Burton  Procedure(s) Performed: Procedure(s) (LRB): COMPRESSION HIP (Left)  Patient Location: PACU  Anesthesia Type: General  Level of Consciousness: sedated, patient cooperative and responds to stimulaton  Airway & Oxygen Therapy: Patient Spontanous Breathing and Patient connected to face mask oxgen  Post-op Assessment: Report given to PACU RN and Post -op Vital signs reviewed and stable  Post vital signs: Reviewed and stable  Complications: No apparent anesthesia complications

## 2011-12-17 NOTE — ED Notes (Signed)
Patient returned from X-ray 

## 2011-12-17 NOTE — ED Provider Notes (Signed)
History     CSN: [id number]  Arrival date & time 12/16/11  2329   First MD Initiated Contact with Patient 12/17/11 [phone]      Chief Complaint  Patient presents with  . Fall  . Hip Pain    (Consider location/radiation/quality/duration/timing/severity/associated sxs/prior treatment) HPI 78 year old male presents to emergency room with complaint of left hip pain after fall. Patient reports he was walking in the dark and missed a step. Patient denies falling and striking his head or neck. Patient reports he was able to get himself up and back into bed with great difficulty and pain. Patient hasn't been unable to bear weight on his left leg. He denies any other injury or complaint at this time Past Medical History  Diagnosis Date  . Thyroid disease   . Hypertension   . BPH (benign prostatic hyperplasia)     Past Surgical History  Procedure Date  . Foot surgery     No family history on file.  History  Substance Use Topics  . Smoking status: Former Games developer  . Smokeless tobacco: Not on file  . Alcohol Use: 0.6 oz/week    1 Glasses of wine per week      Review of Systems  All other systems reviewed and are negative.    Allergies  Horse-derived products  Home Medications   Current Outpatient Rx  Name Route Sig Dispense Refill  . FINASTERIDE 5 MG PO TABS Oral Take 5 mg by mouth daily.    [name] LEVOTHYROXINE SODIUM 137 MCG PO TABS Oral Take 137 mcg by mouth daily.    [name] LOSARTAN POTASSIUM 50 MG PO TABS Oral Take 25 mg by mouth daily.    [name] TAMSULOSIN HCL 0.4 MG PO CAPS Oral Take 0.4 mg by mouth daily.      BP 163/71  Pulse 100  Resp 15  Ht 5\' 9"  (1.753 m)  Wt 140 lb (63.504 kg)  BMI 20.67 kg/m2  SpO2 99%  Physical Exam  Nursing note and vitals reviewed. Constitutional: He is oriented to person, place, and time. He appears well-developed and well-nourished.  HENT:  Head: Normocephalic and atraumatic.  Nose: Nose normal.  Mouth/Throat: Oropharynx is clear and moist.   Eyes: Conjunctivae and EOM are normal. Pupils are equal, round, and reactive to light.  Neck: Normal range of motion. Neck supple. No JVD present. No tracheal deviation present. No thyromegaly present.  Cardiovascular: Normal rate, regular rhythm, normal heart sounds and intact distal pulses.  Exam reveals no gallop and no friction rub.   No murmur heard. Pulmonary/Chest: Effort normal and breath sounds normal. No stridor. No respiratory distress. He has no wheezes. He has no rales. He exhibits no tenderness.  Abdominal: Soft. Bowel sounds are normal. He exhibits no distension and no mass. There is no tenderness. There is no rebound and no guarding.  Musculoskeletal: Normal range of motion. He exhibits tenderness (patient with shortening and external rotation of the left lower extremity). He exhibits no edema.  Lymphadenopathy:    He has no cervical adenopathy.  Neurological: He is oriented to person, place, and time. He exhibits normal muscle tone.  Skin: Skin is dry. No rash noted. No erythema. No pallor.  Psychiatric: He has a normal mood and affect. His behavior is normal. Judgment and thought content normal.    ED Course  Procedures (including critical care time)  Labs Reviewed  CBC - Abnormal; Notable for the following:    WBC 15.4 (*)    RBC 3.54 (*)  Hemoglobin 12.2 (*)    HCT 34.6 (*)    MCH 34.5 (*)    All other components within normal limits  DIFFERENTIAL - Abnormal; Notable for the following:    Neutrophils Relative 85 (*)    Lymphocytes Relative 8 (*)    Neutro Abs 13.1 (*)    Monocytes Absolute 1.1 (*)    All other components within normal limits  COMPREHENSIVE METABOLIC PANEL - Abnormal; Notable for the following:    Glucose, Bld 128 (*)    GFR calc non Af Amer 64 (*)    GFR calc Af Amer 74 (*)    All other components within normal limits  URINALYSIS, ROUTINE W REFLEX MICROSCOPIC - Abnormal; Notable for the following:    APPearance CLOUDY (*)    Ketones, ur  TRACE (*)    Leukocytes, UA TRACE (*)    All other components within normal limits  TYPE AND SCREEN  URINE MICROSCOPIC-ADD ON  SAMPLE TO BLOOD BANK  ABO/RH   Dg Hip Complete Left  12/17/2011  *RADIOLOGY REPORT*  Clinical Data: Post fall, now with left hip pain and deformity  LEFT HIP - COMPLETE 2+ VIEW  Comparison: None.  Findings: There is a comminuted, displaced intertrochanteric fracture of the left femur with varus angulation and foreshortening.  No definite pelvic fracture. Mild bilateral hip degenerative change.  Vascular calcifications.  IMPRESSION: Comminuted, displaced intertrochanteric fracture of the left femur with varus angulation and foreshortening.  Original Report Authenticated By: Waynard Reeds, M.D.   Dg Chest Port 1 View  12/17/2011  *RADIOLOGY REPORT*  Clinical Data: Hip fracture, preoperative radiograph  PORTABLE CHEST - 1 VIEW  Comparison: None.  Findings: Cardiomegaly.  Interstitial prominence has a coarsened appearance.  No focal consolidation.  Aortic atherosclerosis. Osteopenia.  Bilateral glenohumeral and acromioclavicular DJD. Multilevel degenerative changes of the thoracic spine.  IMPRESSION: Coarse interstitial prominence may be chronic.  No focal consolidation.  Original Report Authenticated By: Waneta Martins, M.D.     1. Closed intratrochanteric fracture of femur, left, initial encounter       MDM  78 year old with a left intertrochanteric hip fracture. Will admit to orthopedics for repair      2:38 AM D/w Dr Dion Saucier who will see patient.  Expect admission to the [facility] service as is ASA II, hip fracture.  Olivia Mackie, MD 12/17/11 [phone]

## 2011-12-17 NOTE — Anesthesia Postprocedure Evaluation (Signed)
  Anesthesia Post-op Note  Patient: Cory Burton  Procedure(s) Performed: Procedure(s) (LRB): COMPRESSION HIP (Left)  Patient Location: PACU  Anesthesia Type: General  Level of Consciousness: awake and alert   Airway and Oxygen Therapy: Patient Spontanous Breathing  Post-op Pain: mild  Post-op Assessment: Post-op Vital signs reviewed, Patient's Cardiovascular Status Stable, Respiratory Function Stable, Patent Airway and No signs of Nausea or vomiting  Post-op Vital Signs: stable  Complications: No apparent anesthesia complications

## 2011-12-17 NOTE — Anesthesia Preprocedure Evaluation (Addendum)
Anesthesia Evaluation  Patient identified by MRN, date of birth, ID band Patient awake    Reviewed: Allergy & Precautions, H&P , NPO status , Patient's Chart, lab work & pertinent test results  Airway Mallampati: II TM Distance: >3 FB Neck ROM: Full    Dental No notable dental hx.    Pulmonary neg pulmonary ROS, former smoker CXR: coarse interstitial prominance may be chronic. No focal consolidation. Oxygen saturation 98% RA breath sounds clear to auscultation  Pulmonary exam normal       Cardiovascular Exercise Tolerance: Good hypertension, Pt. on medications Rhythm:Regular Rate:Normal  He denies hypertension. ECG: SR 98, RBBB   Neuro/Psych negative neurological ROS  negative psych ROS   GI/Hepatic negative GI ROS, Neg liver ROS,   Endo/Other  Hypothyroidism   Renal/GU negative Renal ROS  negative genitourinary   Musculoskeletal negative musculoskeletal ROS (+)   Abdominal   Peds negative pediatric ROS (+)  Hematology negative hematology ROS (+)   Anesthesia Other Findings   Reproductive/Obstetrics negative OB ROS                        Anesthesia Physical Anesthesia Plan  ASA: III  Anesthesia Plan: General   Post-op Pain Management:    Induction: Intravenous  Airway Management Planned: Oral ETT  Additional Equipment:   Intra-op Plan:   Post-operative Plan: Extubation in OR  Informed Consent: I have reviewed the patients History and Physical, chart, labs and discussed the procedure including the risks, benefits and alternatives for the proposed anesthesia with the patient or authorized representative who has indicated his/her understanding and acceptance.   Dental advisory given  Plan Discussed with: CRNA  Anesthesia Plan Comments: (Discussed spinal versus general. He prefers general.)       Anesthesia Quick Evaluation

## 2011-12-17 NOTE — ED Notes (Signed)
MD at bedside.  Dr. Otter at bedside 

## 2011-12-18 LAB — BASIC METABOLIC PANEL
BUN: 15 mg/dL (ref 6–23)
CO2: 24 mEq/L (ref 19–32)
Calcium: 8.9 mg/dL (ref 8.4–10.5)
Chloride: 99 mEq/L (ref 96–112)
Creatinine, Ser: 0.86 mg/dL (ref 0.50–1.35)
GFR calc Af Amer: 85 mL/min — ABNORMAL LOW (ref >90)
GFR calc non Af Amer: 74 mL/min — ABNORMAL LOW (ref >90)
Glucose, Bld: 194 mg/dL — ABNORMAL HIGH (ref 70–99)
Potassium: 4.6 mEq/L (ref 3.5–5.1)
Sodium: 132 mEq/L — ABNORMAL LOW (ref 135–145)

## 2011-12-18 LAB — CBC
HCT: 33.5 % — ABNORMAL LOW (ref 39.0–52.0)
Hemoglobin: 11.6 g/dL — ABNORMAL LOW (ref 13.0–17.0)
MCH: 33.8 pg (ref 26.0–34.0)
MCHC: 34.6 g/dL (ref 30.0–36.0)
MCV: 97.7 fL (ref 78.0–100.0)
Platelets: 190 10*3/uL (ref 150–400)
RBC: 3.43 MIL/uL — ABNORMAL LOW (ref 4.22–5.81)
RDW: 12.6 % (ref 11.5–15.5)
WBC: 11.1 10*3/uL — ABNORMAL HIGH (ref 4.0–10.5)

## 2011-12-18 LAB — PROTIME-INR
INR: 1.13 (ref 0.00–1.49)
Prothrombin Time: 14.7 seconds (ref 11.6–15.2)

## 2011-12-18 NOTE — Progress Notes (Signed)
Clinical Social Work Department BRIEF PSYCHOSOCIAL ASSESSMENT 12/18/2011  Patient:  Cory Burton, Cory Burton     Account Number:  [id number]     Admit date:  12/16/2011  Clinical Social Worker:  Orpah Greek  Date/Time:  12/18/2011 11:16 AM  Referred by:  Physician  Date Referred:  12/18/2011 Referred for  Other - See comment   Other Referral:   Admitted from [facility]   Interview type:  Patient Other interview type:    PSYCHOSOCIAL DATA Living Status:  FACILITY Admitted from facility:  [facility] Level of care:  Independent Living Primary support name:  Pricilla Quintin Primary support relationship to patient:  SPOUSE Degree of support available:   good    CURRENT CONCERNS Current Concerns  Post-Acute Placement   Other Concerns:    SOCIAL WORK ASSESSMENT / PLAN CSW received referral for SNF placement. Noted PT recommending Home Health. Spoke with Pt to confirm d/c plan back to independent living.   Assessment/plan status:  No Further Intervention Required Other assessment/ plan:   Information/referral to community resources:   No placement needed. CSW signing off.    PATIENT'S/FAMILY'S RESPONSE TO PLAN OF CARE: Pt confirmed no need for SNF placement.       Unice Bailey, LCSWA [phone]

## 2011-12-18 NOTE — Evaluation (Signed)
Physical Therapy Evaluation Patient Details Name: Cory Burton MRN: [id number] DOB: 1918-02-24 Today's Date: 12/18/2011 Time: 4098-1191 PT Time Calculation (min): 39 min  PT Assessment / Plan / Recommendation Clinical Impression  Pt with L hip fx and ORIF presents with decreased L LE strength/ROM and limitations in functional mobility    PT Assessment  Patient needs continued PT services    Follow Up Recommendations  Home health PT    Equipment Recommendations  None recommended by PT    Frequency Min 6X/week    Precautions / Restrictions Precautions Precautions: Fall Restrictions Weight Bearing Restrictions: No LLE Weight Bearing: Weight bearing as tolerated   Pertinent Vitals/Pain Pt reports min pain - "its just sore"      Mobility  Bed Mobility Bed Mobility: Supine to Sit Supine to Sit: 3: Mod assist Details for Bed Mobility Assistance: cues for sequence and to self assist with UEs Transfers Transfers: Sit to Stand;Stand to Sit Sit to Stand: 3: Mod assist Stand to Sit: 3: Mod assist;4: Min assist Details for Transfer Assistance: cues for LE management and use of UEs to self assist Ambulation/Gait Ambulation/Gait Assistance: 4: Min assist;3: Mod assist Ambulation Distance (Feet): 70 Feet Assistive device: Rolling walker Ambulation/Gait Assistance Details: multimodal cues for sequence, posture, position from RW and increased heel contact on L Gait Pattern: Step-to pattern;Step-through pattern;Decreased weight shift to left    Exercises General Exercises - Lower Extremity Ankle Circles/Pumps: AROM;10 reps;Both;Supine Quad Sets: AROM;10 reps;Supine;Both Heel Slides: AAROM;10 reps;Supine;Left Hip ABduction/ADduction: 10 reps;AAROM;Supine;Left   PT Goals Acute Rehab PT Goals PT Goal Formulation: With patient Time For Goal Achievement: 12/25/11 Potential to Achieve Goals: Good Pt will go Supine/Side to Sit: with supervision PT Goal: Supine/Side to Sit - Progress:  Goal set today Pt will go Sit to Supine/Side: with supervision PT Goal: Sit to Supine/Side - Progress: Goal set today Pt will go Sit to Stand: with supervision PT Goal: Sit to Stand - Progress: Goal set today Pt will go Stand to Sit: with supervision PT Goal: Stand to Sit - Progress: Goal set today Pt will Ambulate: 51 - 150 feet;with supervision;with rolling walker PT Goal: Ambulate - Progress: Goal set today  Visit Information  Last PT Received On: 12/18/11 Assistance Needed: +1    Subjective Data  Subjective: A lot of people at my complex have had this happen.  I want to go home to my apt at [facility] and not to their rehab unit Patient Stated Goal: Resume previous active lifestyle asap   Prior Functioning  Home Living Lives With: Spouse Available Help at Discharge: Family (Dtr is here from out of town to assist) Type of Home: Apartment Home Access: Level entry;Elevator Home Layout: One level Home Adaptive Equipment: Walker - rolling Prior Function Level of Independence: Independent Able to Take Stairs?: Yes Driving: Yes Communication Communication: HOH Dominant Hand: Right    Cognition  Overall Cognitive Status: Appears within functional limits for tasks assessed/performed Arousal/Alertness: Awake/alert Orientation Level: Appears intact for tasks assessed Behavior During Session: [facility] for tasks performed    Extremity/Trunk Assessment Right Upper Extremity Assessment RUE ROM/Strength/Tone: Within functional levels Left Upper Extremity Assessment LUE ROM/Strength/Tone: Within functional levels Right Lower Extremity Assessment RLE ROM/Strength/Tone: Within functional levels Left Lower Extremity Assessment LLE ROM/Strength/Tone: Deficits LLE ROM/Strength/Tone Deficits: hip flex to 80 - ltd by discomfort; quads 3/5; hip strength 2+/5   Balance    End of Session PT - End of Session Equipment Utilized During Treatment: Gait belt Activity Tolerance: Patient tolerated  treatment well Patient left: in chair;with call bell/phone within reach Nurse Communication: Mobility status   Cory Burton 12/18/2011, 10:26 AM

## 2011-12-18 NOTE — Progress Notes (Signed)
Physical Therapy Treatment Patient Details Name: Dorian Duval MRN: [id number] DOB: [birth year] Today's Date: 12/18/2011 Time: 1340-1405 PT Time Calculation (min): 25 min  PT Assessment / Plan / Recommendation Comments on Treatment Session       Follow Up Recommendations  Home health PT;Skilled nursing facility (dependent on acute stay and level of home assist)    Equipment Recommendations  None recommended by PT    Frequency Min 6X/week   Plan Discharge plan needs to be updated    Precautions / Restrictions Precautions Precautions: Fall Precaution Comments: Pt impulsive and with mod cues for saftey and to slow pace Restrictions Weight Bearing Restrictions: No LLE Weight Bearing: Weight bearing as tolerated   Pertinent Vitals/Pain Min c/o pain    Mobility  Bed Mobility Bed Mobility: Sit to Supine Sit to Supine: 4: Min assist;3: Mod assist Details for Bed Mobility Assistance: cues for sequence and to self assist with UEs Transfers Transfers: Sit to Stand;Stand to Sit Sit to Stand: 4: Min assist;3: Mod assist Stand to Sit: 4: Min assist;3: Mod assist Details for Transfer Assistance: cues for LE management and use of UEs to self assist Ambulation/Gait Ambulation/Gait Assistance: 4: Min assist;3: Mod assist Ambulation Distance (Feet): 140 Feet (140' and 20') Assistive device: Rolling walker Ambulation/Gait Assistance Details: multimodal cues for sequence, posture, position from RW and increased heel contact on L Gait Pattern: Step-to pattern;Step-through pattern    Exercises     PT Goals Acute Rehab PT Goals Time For Goal Achievement: 12/25/11 Potential to Achieve Goals: Good Pt will go Supine/Side to Sit: with supervision PT Goal: Supine/Side to Sit - Progress: Progressing toward goal Pt will go Sit to Supine/Side: with supervision PT Goal: Sit to Supine/Side - Progress: Progressing toward goal Pt will go Sit to Stand: with supervision PT Goal: Sit to Stand -  Progress: Progressing toward goal Pt will go Stand to Sit: with supervision PT Goal: Stand to Sit - Progress: Progressing toward goal Pt will Ambulate: 51 - 150 feet;with supervision;with rolling walker PT Goal: Ambulate - Progress: Progressing toward goal  Visit Information  Last PT Received On: 12/18/11 Assistance Needed: +1    Subjective Data  Subjective: I think I'm doing ok Patient Stated Goal: Resume previous active lifestyle asap   Cognition  Overall Cognitive Status: Appears within functional limits for tasks assessed/performed Arousal/Alertness: Awake/alert Orientation Level: Appears intact for tasks assessed Behavior During Session: [facility] for tasks performed    Balance     End of Session PT - End of Session Activity Tolerance: Patient tolerated treatment well Patient left: in bed;with call bell/phone within reach Nurse Communication: Mobility status    Pearse Shiffler 12/18/2011, 3:02 PM

## 2011-12-18 NOTE — Progress Notes (Signed)
CARE MANAGEMENT NOTE 12/18/2011  Patient:  Cory Burton, Cory Burton   Account Number:  [id number]  Date Initiated:  12/18/2011  Documentation initiated by:  Colleen Can  Subjective/Objective Assessment:   dx intertrochanteric fracture; orif     Action/Plan:   CM spoke with patient'. Pt states d/c plans are currently incomplete. States he and spouse live in independent living . States he is not progressing like he wants to today . See PT notes   Anticipated DC Date:  12/20/2011   Anticipated DC Plan:  SKILLED NURSING FACILITY  In-house referral  Clinical Social Worker      DC Planning Services  CM consult      Status of service:   Medicare Important Message given?   (If response is "NO", the following Medicare IM given date fields will be blank) Comments:  12/18/2011 Raynelle Bring BSN CCM Pt states his daughter plans to find out if he has any home health options that are provided by his independent living community.  CM will follow as needed.

## 2011-12-18 NOTE — Progress Notes (Signed)
Patient ID: Cory Burton, male   DOB: 01-10-1918, 78 y.o.   MRN: [id number]     Subjective:  Patient reports pain as mild to moderate.  Patient states that he is doing much better and denies any CP or SOB.  Objective:   VITALS:   Filed Vitals:   12/18/11 1000 12/18/11 1200 12/18/11 1431 12/18/11 1600  BP: 129/71  117/69   Pulse: 117  113   Temp: 99.1 F (37.3 C)  98.2 F (36.8 C)   TempSrc:   Oral   Resp: 16 14 14 16   Height:      Weight:      SpO2: 99% 98% 95% 94%    ABD soft Sensation intact distally Dorsiflexion/Plantar flexion intact Incision: dressing C/D/I and no drainage  LABS  Results for orders placed during the hospital encounter of 12/16/11 (from the past 24 hour(s))  PROTIME-INR     Status: Normal   Collection Time   12/18/11  4:21 AM      Component Value Range   Prothrombin Time 14.7  11.6 - 15.2 (seconds)   INR 1.13  0.00 - 1.49   CBC     Status: Abnormal   Collection Time   12/18/11  4:21 AM      Component Value Range   WBC 11.1 (*) 4.0 - 10.5 (K/uL)   RBC 3.43 (*) 4.22 - 5.81 (MIL/uL)   Hemoglobin 11.6 (*) 13.0 - 17.0 (g/dL)   HCT 40.9 (*) 81.1 - 52.0 (%)   MCV 97.7  78.0 - 100.0 (fL)   MCH 33.8  26.0 - 34.0 (pg)   MCHC 34.6  30.0 - 36.0 (g/dL)   RDW 91.4  78.2 - 95.6 (%)   Platelets 190  150 - 400 (K/uL)  BASIC METABOLIC PANEL     Status: Abnormal   Collection Time   12/18/11  4:21 AM      Component Value Range   Sodium 132 (*) 135 - 145 (mEq/L)   Potassium 4.6  3.5 - 5.1 (mEq/L)   Chloride 99  96 - 112 (mEq/L)   CO2 24  19 - 32 (mEq/L)   Glucose, Bld 194 (*) 70 - 99 (mg/dL)   BUN 15  6 - 23 (mg/dL)   Creatinine, Ser 2.13  0.50 - 1.35 (mg/dL)   Calcium 8.9  8.4 - 08.6 (mg/dL)   GFR calc non Af Amer 74 (*) >90 (mL/min)   GFR calc Af Amer 85 (*) >90 (mL/min)    Dg Hip Complete Left  12/17/2011  *RADIOLOGY REPORT*  Clinical Data: Post fall, now with left hip pain and deformity  LEFT HIP - COMPLETE 2+ VIEW  Comparison: None.  Findings: There is  a comminuted, displaced intertrochanteric fracture of the left femur with varus angulation and foreshortening.  No definite pelvic fracture. Mild bilateral hip degenerative change.  Vascular calcifications.  IMPRESSION: Comminuted, displaced intertrochanteric fracture of the left femur with varus angulation and foreshortening.  Original Report Authenticated By: Waynard Reeds, M.D.   Dg Hip Operative Left  12/17/2011  *RADIOLOGY REPORT*  Clinical Data: Left hip fracture  OPERATIVE LEFT HIP  Comparison: Four digital C-arm fluoroscopic images obtained intraoperatively are compared to the preceding study of 12/17/2011  Findings: 38 seconds of fluoroscopy time utilized. Images demonstrate placement of a plate and compression screw across a reduced intertrochanteric fracture of the left femur. No dislocation or additional focal bony abnormalities identified.  IMPRESSION: Post ORIF of reduced intertrochanteric fracture left femur.  Original Report Authenticated By: Lollie Marrow, M.D.   Dg Pelvis Portable  12/17/2011  *RADIOLOGY REPORT*  Clinical Data: Left hip fracture fixation.  PORTABLE PELVIS  Comparison: Earlier radiographs, same date.  Findings: The side plate and screws and dynamic hip screw are transfixing the intertrochanteric fracture with anatomic alignment. No complicating features.  The pubic symphysis and SI joints are intact.  IMPRESSION: Internal fixation of the intertrochanteric left hip fracture with anatomic reduction.  Original Report Authenticated By: P. Loralie Champagne, M.D.   Dg Chest Port 1 View  12/17/2011  *RADIOLOGY REPORT*  Clinical Data: Hip fracture, preoperative radiograph  PORTABLE CHEST - 1 VIEW  Comparison: None.  Findings: Cardiomegaly.  Interstitial prominence has a coarsened appearance.  No focal consolidation.  Aortic atherosclerosis. Osteopenia.  Bilateral glenohumeral and acromioclavicular DJD. Multilevel degenerative changes of the thoracic spine.  IMPRESSION: Coarse  interstitial prominence may be chronic.  No focal consolidation.  Original Report Authenticated By: Waneta Martins, M.D.   Dg Hip Portable 1 View Left  12/17/2011  *RADIOLOGY REPORT*  Clinical Data: Left hip fracture fixation.  PORTABLE LEFT HIP - 1 VIEW  Comparison: Earlier films, same date.  Findings: The fixating hardware is in good position without complicating features.  Anatomic reduction of the fracture.  IMPRESSION: Internal fixation with anatomic reduction.  Original Report Authenticated By: P. Loralie Champagne, M.D.    Assessment/Plan: 1 Day Post-Op   Principal Problem:  *Fracture, intertrochanteric, left femur   Advance diet Up with therapy Doing well on current pain meds. Plan DC on Sunday or Monday   Haskel Khan 12/18/2011, 6:43 PM   Teryl Lucy, MD [phone] pager

## 2011-12-19 LAB — BASIC METABOLIC PANEL
BUN: 19 mg/dL (ref 6–23)
CO2: 25 mEq/L (ref 19–32)
Calcium: 8.7 mg/dL (ref 8.4–10.5)
Chloride: 100 mEq/L (ref 96–112)
Creatinine, Ser: 0.98 mg/dL (ref 0.50–1.35)
GFR calc Af Amer: 81 mL/min — ABNORMAL LOW (ref >90)
GFR calc non Af Amer: 70 mL/min — ABNORMAL LOW (ref >90)
Glucose, Bld: 128 mg/dL — ABNORMAL HIGH (ref 70–99)
Potassium: 4.2 mEq/L (ref 3.5–5.1)
Sodium: 134 mEq/L — ABNORMAL LOW (ref 135–145)

## 2011-12-19 LAB — CBC
HCT: 28.7 % — ABNORMAL LOW (ref 39.0–52.0)
Hemoglobin: 10 g/dL — ABNORMAL LOW (ref 13.0–17.0)
MCH: 34.2 pg — ABNORMAL HIGH (ref 26.0–34.0)
MCHC: 34.8 g/dL (ref 30.0–36.0)
MCV: 98.3 fL (ref 78.0–100.0)
Platelets: 195 10*3/uL (ref 150–400)
RBC: 2.92 MIL/uL — ABNORMAL LOW (ref 4.22–5.81)
RDW: 12.8 % (ref 11.5–15.5)
WBC: 12.7 10*3/uL — ABNORMAL HIGH (ref 4.0–10.5)

## 2011-12-19 LAB — PROTIME-INR
INR: 1.18 (ref 0.00–1.49)
Prothrombin Time: 15.2 seconds (ref 11.6–15.2)

## 2011-12-19 MED ORDER — WARFARIN SODIUM 4 MG PO TABS
4.0000 mg | ORAL_TABLET | Freq: Once | ORAL | Status: AC
  Administered 2011-12-19: 4 mg via ORAL
  Filled 2011-12-19: qty 1

## 2011-12-19 NOTE — Progress Notes (Signed)
Patient ID: Cory Burton, male   DOB: [birth year], 78 y.o.   MRN: [id number]     Subjective:  Patient reports pain as mild to moderate.  He states that he is doing well and will be happy to get to rehab on Monday   Objective:   VITALS:   Filed Vitals:   12/18/11 1431 12/18/11 1600 12/18/11 2108 12/19/11 0608  BP: 117/69  136/74 123/74  Pulse: 113  118 111  Temp: 98.2 F (36.8 C)  98.7 F (37.1 C) 98.2 F (36.8 C)  TempSrc: Oral  Oral Oral  Resp: 14 16 16 18   Height:      Weight:      SpO2: 95% 94% 96% 96%    ABD soft Sensation intact distally Dorsiflexion/Plantar flexion intact Incision: dressing C/D/I and no drainage  LABS  Results for orders placed during the hospital encounter of 12/16/11 (from the past 24 hour(s))  PROTIME-INR     Status: Normal   Collection Time   12/19/11  4:10 AM      Component Value Range   Prothrombin Time 15.2  11.6 - 15.2 (seconds)   INR 1.18  0.00 - 1.49   CBC     Status: Abnormal   Collection Time   12/19/11  4:10 AM      Component Value Range   WBC 12.7 (*) 4.0 - 10.5 (K/uL)   RBC 2.92 (*) 4.22 - 5.81 (MIL/uL)   Hemoglobin 10.0 (*) 13.0 - 17.0 (g/dL)   HCT 62.1 (*) 30.8 - 52.0 (%)   MCV 98.3  78.0 - 100.0 (fL)   MCH 34.2 (*) 26.0 - 34.0 (pg)   MCHC 34.8  30.0 - 36.0 (g/dL)   RDW 65.7  84.6 - 96.2 (%)   Platelets 195  150 - 400 (K/uL)  BASIC METABOLIC PANEL     Status: Abnormal   Collection Time   12/19/11  4:10 AM      Component Value Range   Sodium 134 (*) 135 - 145 (mEq/L)   Potassium 4.2  3.5 - 5.1 (mEq/L)   Chloride 100  96 - 112 (mEq/L)   CO2 25  19 - 32 (mEq/L)   Glucose, Bld 128 (*) 70 - 99 (mg/dL)   BUN 19  6 - 23 (mg/dL)   Creatinine, Ser 9.52  0.50 - 1.35 (mg/dL)   Calcium 8.7  8.4 - 84.1 (mg/dL)   GFR calc non Af Amer 70 (*) >90 (mL/min)   GFR calc Af Amer 81 (*) >90 (mL/min)    Dg Hip Operative Left  12/17/2011  *RADIOLOGY REPORT*  Clinical Data: Left hip fracture  OPERATIVE LEFT HIP  Comparison: Four digital C-arm  fluoroscopic images obtained intraoperatively are compared to the preceding study of 12/17/2011  Findings: 38 seconds of fluoroscopy time utilized. Images demonstrate placement of a plate and compression screw across a reduced intertrochanteric fracture of the left femur. No dislocation or additional focal bony abnormalities identified.  IMPRESSION: Post ORIF of reduced intertrochanteric fracture left femur.  Original Report Authenticated By: Lollie Marrow, M.D.   Dg Pelvis Portable  12/17/2011  *RADIOLOGY REPORT*  Clinical Data: Left hip fracture fixation.  PORTABLE PELVIS  Comparison: Earlier radiographs, same date.  Findings: The side plate and screws and dynamic hip screw are transfixing the intertrochanteric fracture with anatomic alignment. No complicating features.  The pubic symphysis and SI joints are intact.  IMPRESSION: Internal fixation of the intertrochanteric left hip fracture with anatomic reduction.  Original Report Authenticated  By: P. Loralie Champagne, M.D.   Dg Hip Portable 1 View Left  12/17/2011  *RADIOLOGY REPORT*  Clinical Data: Left hip fracture fixation.  PORTABLE LEFT HIP - 1 VIEW  Comparison: Earlier films, same date.  Findings: The fixating hardware is in good position without complicating features.  Anatomic reduction of the fracture.  IMPRESSION: Internal fixation with anatomic reduction.  Original Report Authenticated By: P. Loralie Champagne, M.D.    Assessment/Plan: 2 Days Post-Op   Principal Problem:  *Fracture, intertrochanteric, left femur   Advance diet Up with therapy Will plan DC to SNF on Monday   Haskel Khan 12/19/2011, 9:16 AM   Teryl Lucy, MD [phone] pager

## 2011-12-19 NOTE — Evaluation (Signed)
Occupational Therapy Evaluation Patient Details Name: Cory Burton MRN: [id number] DOB: 04/27/1918 Today's Date: 12/19/2011 Time: 4098-1191 OT Time Calculation (min): 39 min  OT Assessment / Plan / Recommendation Clinical Impression  78 yo male s/p Lt hip pining that could benefit from skilled Ot acutely to address the below list of deficits. Pt progressing well and will required HHOT at d/c    OT Assessment  Patient needs continued OT Services    Follow Up Recommendations  Home health OT;Supervision/Assistance - 24 hour (for a few days )    Equipment Recommendations  Tub/shower bench    Frequency Min 3X/week    Precautions / Restrictions Precautions Precautions: Fall Precaution Comments: slightly impulsive  Restrictions LLE Weight Bearing: Weight bearing as tolerated   Pertinent Vitals/Pain 1.5 out 10 pain per pt    ADL  Eating/Feeding: Performed;Set up Where Assessed - Eating/Feeding: Chair Grooming: Performed;Teeth care;Wash/dry face;Supervision/safety Where Assessed - Grooming: Standing at sink Upper Body Bathing: Performed;Chest;Right arm;Left arm;Abdomen;Supervision/safety Where Assessed - Upper Body Bathing: Standing at sink Upper Body Dressing: Simulated;Supervision/safety Where Assessed - Upper Body Dressing: Sitting, chair;Supported Lower Body Dressing: Simulated;Minimal assistance Where Assessed - Lower Body Dressing: Sitting, chair;Supported Statistician: Research scientist (life sciences) Method: Proofreader: Raised toilet seat with arms (or 3-in-1 over toilet) (min v/c for hand placement) Toileting - Clothing Manipulation: Performed;Supervision/safety Where Assessed - Toileting Clothing Manipulation: Sit on 3-in-1 or toilet Toileting - Hygiene: Performed;Supervision/safety Where Assessed - Toileting Hygiene: Sit to stand from 3-in-1 or toilet Equipment Used: Gait belt;Rolling walker Ambulation Related to ADLs: Pt  ambulated ~10 ft this session Min Guard due to impulsive and v/c to decrease speed  ADL Comments: Pt completed ADl at sink level. Pt with need to urinate frequently and difficulty voiding    OT Goals Acute Rehab OT Goals OT Goal Formulation: With patient Time For Goal Achievement: 12/26/11 Potential to Achieve Goals: Good ADL Goals Pt Will Perform Lower Body Bathing: with set-up;Sitting, chair ADL Goal: Lower Body Bathing - Progress: Goal set today Pt Will Perform Lower Body Dressing: with set-up;Sitting, bed;Sitting, chair;Unsupported ADL Goal: Lower Body Dressing - Progress: Goal set today Pt Will Perform Tub/Shower Transfer: with supervision;with DME ADL Goal: Tub/Shower Transfer - Progress: Goal set today Miscellaneous OT Goals Miscellaneous OT Goal #1: Pt will complete bed mobility MOD I with Hob 20 degrees or less no bed rails as precursor to adls OT Goal: Miscellaneous Goal #1 - Progress: Goal set today  Visit Information  Last OT Received On: 12/19/11 Assistance Needed: +1    Subjective Data  Subjective[facility] and daughters (A) Patient Stated Goal: exercising and attending meetings   Prior Functioning  Home Living Lives With: Spouse Available Help at Discharge: Family Type of Home: Apartment Home Access: Level entry;Elevator Home Layout: One level Bathroom Shower/Tub: Forensic scientist: Standard Bathroom Accessibility: Yes How Accessible: Accessible via walker Home Adaptive Equipment: Walker - rolling Prior Function Level of Independence: Independent Able to Take Stairs?: Yes Driving: Yes 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, educational work Musician: HOH Dominant Hand: Right    Cognition  Overall Cognitive Status: Appears within functional limits for tasks assessed/performed Arousal/Alertness: Awake/alert Orientation Level: Appears intact for tasks assessed Behavior During Session: [facility] for  tasks performed    Extremity/Trunk Assessment Right Upper Extremity Assessment RUE ROM/Strength/Tone: Within functional levels RUE Coordination: WFL - gross/fine motor Left Upper Extremity Assessment LUE ROM/Strength/Tone: Within functional levels LUE Coordination: WFL - gross/fine motor Trunk Assessment Trunk  Assessment: Normal   Mobility Transfers Transfers: Sit to Stand;Stand to Sit Sit to Stand: From toilet;With upper extremity assist;With armrests (Min Guard (A)) Stand to Sit: 4: Min assist;To chair/3-in-1;With upper extremity assist;With armrests Details for Transfer Assistance: v/c for Lt LE placement prior to sitting to decrease pain and control descend with UE   Exercise    Balance    End of Session OT - End of Session Equipment Utilized During Treatment: Gait belt Activity Tolerance: Patient tolerated treatment well Patient left: in chair;with call bell/phone within reach Nurse Communication: Mobility status     Next session : address Lt LB bathing/ dressing and tub tranfer with Randalyn Rhea 12/19/2011, 10:13 AM Pager: [phone]

## 2011-12-19 NOTE — Progress Notes (Signed)
ANTICOAGULATION CONSULT NOTE - Follow Up Consult  Pharmacy Consult for Warfarin Indication: VTE prophylaxis  Allergies  Allergen Reactions  . Horse-Derived Products     Horse serum    Patient Measurements: Height: 5\' 9"  (175.3 cm) Weight: 140 lb (63.504 kg) IBW/kg (Calculated) : 70.7    Vital Signs: Temp: 98.2 F (36.8 C) (05/04 0608) Temp src: Oral (05/04 0608) BP: 123/74 mmHg (05/04 0608) Pulse Rate: 111  (05/04 0608)  Labs:  Basename 12/19/11 0410 12/18/11 0421 12/17/11 0133  HGB 10.0* 11.6* --  HCT 28.7* 33.5* 34.6*  PLT 195 190 216  APTT -- -- --  LABPROT 15.2 14.7 --  INR 1.18 1.13 --  HEPARINUNFRC -- -- --  CREATININE 0.98 0.86 1.00  CKTOTAL -- -- --  CKMB -- -- --  TROPONINI -- -- --   Estimated Creatinine Clearance: 44.1 ml/min (by C-G formula based on Cr of 0.98).   Medications:  Scheduled:    .  ceFAZolin (ANCEF) IV  1 g Intravenous Q6H  . coumadin book  1 each Does not apply Once  . docusate sodium  100 mg Oral BID  . enoxaparin  40 mg Subcutaneous Q24H  . finasteride  5 mg Oral Daily  . levothyroxine  137 mcg Oral Daily  . losartan  25 mg Oral Daily  . senna  1 tablet Oral BID  . Tamsulosin HCl  0.4 mg Oral Daily  . warfarin  4 mg Oral ONCE-1800  . warfarin   Does not apply Once  . Warfarin - Pharmacist Dosing Inpatient   Does not apply q1800    Assessment:  71 YOF s/p left hip ORIF on 5/2 after a fall.  Warfarin 4mg  initiated on 5/3.  INR essentially unchanged as expected after first dose.  No bleeding reported, CBC stable.   Goal of Therapy:  INR 2-3   Plan:   Repeat warfarin 4mg  today  Continue lovenox 40mg  sq q24h until INR >/= 1.8 per MD  Daily PT/INR  Loralee Pacas, PharmD, BCPS Pager: [phone] 12/19/2011,11:11 AM

## 2011-12-19 NOTE — Progress Notes (Signed)
Physical Therapy Treatment Patient Details Name: Cory Burton MRN: [id number] DOB: [birth year] Today's Date: 12/19/2011 Time: 6213-0865 PT Time Calculation (min): 38 min  PT Assessment / Plan / Recommendation Comments on Treatment Session       Follow Up Recommendations  Home health PT;Skilled nursing facility    Equipment Recommendations  Tub/shower bench    Frequency Min 6X/week   Plan Discharge plan needs to be updated    Precautions / Restrictions Precautions Precautions: Fall Precaution Comments: slightly impulsive  Restrictions LLE Weight Bearing: Weight bearing as tolerated   Pertinent Vitals/Pain Pt declining pain MEDS - states pain is "not too bad"    Mobility  Bed Mobility Supine to Sit: 4: Min assist Details for Bed Mobility Assistance: cues for sequence and to self assist with UEs Transfers Transfers: Sit to Stand;Stand to Sit Sit to Stand: 4: Min assist Stand to Sit: 4: Min assist Details for Transfer Assistance: cues for LE management and use of UEs to self assist Ambulation/Gait Ambulation/Gait Assistance: 4: Min assist;3: Mod assist Ambulation Distance (Feet): 140 Feet Assistive device: Rolling walker Ambulation/Gait Assistance Details: cues for position from RW, pace, sequence and posture Gait Pattern: Step-to pattern;Step-through pattern    Exercises General Exercises - Lower Extremity Ankle Circles/Pumps: AROM;15 reps;Both;Supine Quad Sets: AROM;15 reps;Both;Supine Gluteal Sets: AROM;Both;10 reps;Supine Heel Slides: AAROM;15 reps;Left;Supine Hip ABduction/ADduction: 15 reps;Left;Supine;AAROM   PT Goals Acute Rehab PT Goals PT Goal Formulation: With patient Time For Goal Achievement: 12/25/11 Potential to Achieve Goals: Good Pt will go Supine/Side to Sit: with supervision PT Goal: Supine/Side to Sit - Progress: Progressing toward goal Pt will go Sit to Supine/Side: with supervision PT Goal: Sit to Supine/Side - Progress: Progressing toward  goal Pt will go Sit to Stand: with supervision PT Goal: Sit to Stand - Progress: Progressing toward goal Pt will go Stand to Sit: with supervision PT Goal: Stand to Sit - Progress: Progressing toward goal Pt will Ambulate: 51 - 150 feet;with supervision;with rolling walker PT Goal: Ambulate - Progress: Progressing toward goal  Visit Information  Last PT Received On: 12/19/11 Assistance Needed: +1    Subjective Data  Subjective: I think I'm doing ok Patient Stated Goal: Resume previous active lifestyle asap   Cognition  Overall Cognitive Status: Appears within functional limits for tasks assessed/performed Arousal/Alertness: Awake/alert Orientation Level: Appears intact for tasks assessed Behavior During Session: [facility] for tasks performed    Balance     End of Session PT - End of Session Activity Tolerance: Patient tolerated treatment well Patient left: Other (comment) (in bathroom with OT) Nurse Communication: Mobility status    Cory Burton 12/19/2011, 1:07 PM

## 2011-12-19 NOTE — Progress Notes (Signed)
Physical Therapy Treatment Patient Details Name: Cory Burton MRN: [id number] DOB: December 09, 1917 Today's Date: 12/19/2011 Time: 1478-2956 PT Time Calculation (min): 24 min  PT Assessment / Plan / Recommendation Comments on Treatment Session  pt very motivated and looking forward to d/c Monday    Follow Up Recommendations  Home health PT;Skilled nursing facility    Equipment Recommendations  Tub/shower bench    Frequency Min 6X/week   Plan Discharge plan needs to be updated    Precautions / Restrictions Precautions Precautions: Fall Precaution Comments: slightly impulsive  Restrictions Weight Bearing Restrictions: No LLE Weight Bearing: Weight bearing as tolerated       Mobility  Bed Mobility Bed Mobility: Sit to Supine Sit to Supine: 4: Min assist Details for Bed Mobility Assistance: cues for sequence and to self assist with UEs Transfers Transfers: Sit to Stand;Stand to Sit Sit to Stand: 4: Min assist Stand to Sit: 4: Min assist Details for Transfer Assistance: cues for LE management and use of UEs to self assist Ambulation/Gait Ambulation/Gait Assistance: 4: Min assist Ambulation Distance (Feet): 180 Feet Assistive device: Rolling walker Ambulation/Gait Assistance Details: cues for position from RW, sequence and posture Gait Pattern: Step-to pattern;Step-through pattern    Exercises     PT Goals Acute Rehab PT Goals PT Goal Formulation: With patient Time For Goal Achievement: 12/25/11 Potential to Achieve Goals: Good Pt will go Supine/Side to Sit: with supervision PT Goal: Supine/Side to Sit - Progress: Progressing toward goal Pt will go Sit to Supine/Side: with supervision PT Goal: Sit to Supine/Side - Progress: Progressing toward goal Pt will go Sit to Stand: with supervision PT Goal: Sit to Stand - Progress: Progressing toward goal Pt will go Stand to Sit: with supervision PT Goal: Stand to Sit - Progress: Progressing toward goal Pt will Ambulate: 51 - 150  feet;with supervision;with rolling walker PT Goal: Ambulate - Progress: Progressing toward goal  Visit Information  Last PT Received On: 12/19/11 Assistance Needed: +1    Subjective Data  Subjective: I was getting cold - I need to move Patient Stated Goal: Resume previous active lifestyle asap   Cognition  Overall Cognitive Status: Appears within functional limits for tasks assessed/performed Arousal/Alertness: Awake/alert Orientation Level: Appears intact for tasks assessed Behavior During Session: [facility] for tasks performed    Balance     End of Session PT - End of Session Activity Tolerance: Patient tolerated treatment well Patient left: in bed;with call bell/phone within reach Nurse Communication: Mobility status    Cory Burton 12/19/2011, 2:45 PM

## 2011-12-20 LAB — CBC
HCT: 27.3 % — ABNORMAL LOW (ref 39.0–52.0)
Hemoglobin: 9.4 g/dL — ABNORMAL LOW (ref 13.0–17.0)
MCH: 34.1 pg — ABNORMAL HIGH (ref 26.0–34.0)
MCHC: 34.4 g/dL (ref 30.0–36.0)
MCV: 98.9 fL (ref 78.0–100.0)
Platelets: 201 10*3/uL (ref 150–400)
RBC: 2.76 MIL/uL — ABNORMAL LOW (ref 4.22–5.81)
RDW: 13 % (ref 11.5–15.5)
WBC: 11 10*3/uL — ABNORMAL HIGH (ref 4.0–10.5)

## 2011-12-20 LAB — PROTIME-INR
INR: 1.61 — ABNORMAL HIGH (ref 0.00–1.49)
Prothrombin Time: 19.4 seconds — ABNORMAL HIGH (ref 11.6–15.2)

## 2011-12-20 MED ORDER — WARFARIN SODIUM 2.5 MG PO TABS
2.5000 mg | ORAL_TABLET | Freq: Once | ORAL | Status: AC
  Administered 2011-12-20: 2.5 mg via ORAL
  Filled 2011-12-20: qty 1

## 2011-12-20 NOTE — Progress Notes (Signed)
Physical Therapy Treatment Patient Details Name: Cory Burton MRN: [id number] DOB: 1918/07/29 Today's Date: 12/20/2011 Time: 0920-0950 PT Time Calculation (min): 30 min  PT Assessment / Plan / Recommendation Comments on Treatment Session       Follow Up Recommendations  Skilled nursing facility    Equipment Recommendations  Defer to next venue    Frequency Min 6X/week   Plan Discharge plan remains appropriate    Precautions / Restrictions Precautions Precautions: Fall Restrictions LLE Weight Bearing: Weight bearing as tolerated   Pertinent Vitals/Pain 3/10 left leg with mobility    Mobility  Transfers Transfers: Sit to Stand;Stand to Sit Sit to Stand: With armrests;With upper extremity assist;From chair/3-in-1;4: Min guard Stand to Sit: 4: Min guard;With armrests;With upper extremity assist;To chair/3-in-1 Details for Transfer Assistance: cues for techique and assist to gently lower left leg to the ground Ambulation/Gait Ambulation/Gait Assistance: 4: Min guard Ambulation Distance (Feet): 200 Feet Assistive device: Rolling walker Ambulation/Gait Assistance Details: keeps left heel elevated off floor and complained of stiffness when cued to try to get foot flat. Gait Pattern: Antalgic    Exercises General Exercises - Lower Extremity Short Arc Quad: AROM;Left;10 reps;Seated Heel Slides: AAROM;Left;10 reps;Seated Hip ABduction/ADduction: AAROM;Left;10 reps;Seated Other Exercises Other Exercises: towel stretch to left calf with sheet and cues for technique 3 x 20 second hold   PT Goals Acute Rehab PT Goals Pt will go Sit to Stand: with supervision PT Goal: Sit to Stand - Progress: Progressing toward goal Pt will Ambulate: 51 - 150 feet;with supervision;with rolling walker PT Goal: Ambulate - Progress: Progressing toward goal  Visit Information  Last PT Received On: 12/20/11 Assistance Needed: +1    Subjective Data  Subjective: Guess I'm ready   Cognition  Overall Cognitive Status: Appears within functional limits for tasks assessed/performed Arousal/Alertness: Awake/alert Orientation Level: Appears intact for tasks assessed Behavior During Session: [facility] for tasks performed    Balance     End of Session PT - End of Session Equipment Utilized During Treatment: Gait belt Activity Tolerance: Patient tolerated treatment well Patient left: in chair;with call bell/phone within reach    [facility] 12/20/2011, 1:31 PM

## 2011-12-20 NOTE — Progress Notes (Signed)
OT Discharge Note  Patient is being discharged from OT services secondary to:    Goals met and no further therapy needs identified.  Please see latest Therapy Progress Note for current level of functioning and progress toward goals.  Progress and discharge plan and discussed with patient/caregiver and they    Agree   Do recommend that patient get HHOT at d/c to [facility]   Pt has met acute OT goals   Lucile Shutters   OTR/L Pager: [phone] Office: [phone] .

## 2011-12-20 NOTE — Progress Notes (Signed)
Patient ID: Cory Burton, male   DOB: 10-31-1917, 78 y.o.   MRN: [id number]     Subjective:  Patient reports pain as mild to moderate.  Patient is much better today Bowels working and he is able to get around reasonably well.  Objective:   VITALS:   Filed Vitals:   12/19/11 1402 12/19/11 2115 12/20/11 0625 12/20/11 0748  BP: 130/70 133/73 122/71   Pulse: 122 114 104   Temp: 97.5 F (36.4 C) 99 F (37.2 C) 98 F (36.7 C)   TempSrc: Oral Oral Oral   Resp: 18 18 18 18   Height:      Weight:      SpO2: 99% 96% 96%     ABD soft Sensation intact distally Dorsiflexion/Plantar flexion intact Incision: dressing C/D/I  LABS  Results for orders placed during the hospital encounter of 12/16/11 (from the past 24 hour(s))  PROTIME-INR     Status: Abnormal   Collection Time   12/20/11  4:32 AM      Component Value Range   Prothrombin Time 19.4 (*) 11.6 - 15.2 (seconds)   INR 1.61 (*) 0.00 - 1.49   CBC     Status: Abnormal   Collection Time   12/20/11  4:32 AM      Component Value Range   WBC 11.0 (*) 4.0 - 10.5 (K/uL)   RBC 2.76 (*) 4.22 - 5.81 (MIL/uL)   Hemoglobin 9.4 (*) 13.0 - 17.0 (g/dL)   HCT 46.9 (*) 62.9 - 52.0 (%)   MCV 98.9  78.0 - 100.0 (fL)   MCH 34.1 (*) 26.0 - 34.0 (pg)   MCHC 34.4  30.0 - 36.0 (g/dL)   RDW 52.8  41.3 - 24.4 (%)   Platelets 201  150 - 400 (K/uL)    No results found.  Assessment/Plan: 3 Days Post-Op   Principal Problem:  *Fracture, intertrochanteric, left femur  Mild acute blood loss anemia, observed. Advance diet Up with therapy Plan for discharge tomorrow He will need placement at skilled nursing facility.  Haskel Khan 12/20/2011, 8:41 AM   Teryl Lucy, MD [phone] pager

## 2011-12-20 NOTE — Progress Notes (Signed)
ANTICOAGULATION CONSULT NOTE - Follow Up Consult  Pharmacy Consult for Warfarin Indication: VTE prophylaxis  Allergies  Allergen Reactions  . Horse-Derived Products     Horse serum    Patient Measurements: Height: 5\' 9"  (175.3 cm) Weight: 140 lb (63.504 kg) IBW/kg (Calculated) : 70.7    Vital Signs: Temp: 98 F (36.7 C) (05/05 0625) Temp src: Oral (05/05 0625) BP: 122/71 mmHg (05/05 0625) Pulse Rate: 104  (05/05 0625)  Labs:  Basename 12/20/11 0432 12/19/11 0410 12/18/11 0421  HGB 9.4* 10.0* --  HCT 27.3* 28.7* 33.5*  PLT 201 195 190  APTT -- -- --  LABPROT 19.4* 15.2 14.7  INR 1.61* 1.18 1.13  HEPARINUNFRC -- -- --  CREATININE -- 0.98 0.86  CKTOTAL -- -- --  CKMB -- -- --  TROPONINI -- -- --   Estimated Creatinine Clearance: 44.1 ml/min (by C-G formula based on Cr of 0.98).   Medications:  Scheduled:     . docusate sodium  100 mg Oral BID  . enoxaparin  40 mg Subcutaneous Q24H  . finasteride  5 mg Oral Daily  . levothyroxine  137 mcg Oral Daily  . losartan  25 mg Oral Daily  . senna  1 tablet Oral BID  . Tamsulosin HCl  0.4 mg Oral Daily  . warfarin  4 mg Oral ONCE-1800  . Warfarin - Pharmacist Dosing Inpatient   Does not apply q1800    Assessment:  72 YOF s/p left hip ORIF on 5/2 after a fall.  Warfarin 4mg  on 5/3 and 5/4  INR subtherapeutic but now responding  No bleeding reported, CBC stable.   Goal of Therapy:  INR 2-3   Plan:   Warfarin 2.5mg  today due to large jump in INR  Continue lovenox 40mg  sq q24h until INR >/= 1.8 per MD  Daily PT/INR   Hessie Knows, PharmD, BCPS Pager [phone] 12/20/2011 11:06 AM

## 2011-12-20 NOTE — Progress Notes (Signed)
Occupational Therapy Treatment Patient Details Name: Cory Burton MRN: [id number] DOB: [birth year] Today's Date: 12/20/2011 Time: 1012-1030 OT Time Calculation (min): 18 min  OT Assessment / Plan / Recommendation Comments on Treatment Session Pt is at adequate level for d/c from OT standpoint. Pt has all education completed    Follow Up Recommendations  Home health OT;Supervision/Assistance - 24 hour    Equipment Recommendations  Tub/shower bench    Frequency Min 3X/week   Plan Discharge plan remains appropriate    Precautions / Restrictions Precautions Precautions: Fall Restrictions Weight Bearing Restrictions: No LLE Weight Bearing: Weight bearing as tolerated   Pertinent Vitals/Pain No pain    ADL  Upper Body Dressing: Performed;Modified independent Where Assessed - Upper Body Dressing: Sitting, chair;Unsupported Lower Body Dressing: Performed;Supervision/safety (using AE) Where Assessed - Lower Body Dressing: Sit to stand from chair Tub/Shower Transfer: Performed;Supervision/safety (simulated with bench and trash can) Tub/Shower Transfer Method: Science writer: Counsellor Used: Gait belt;Rolling walker ADL Comments: Pt had AE education this session with excellent return demo. pt's daughter to purchase a hip kit on Monday 12/21/11. Pt demonstrated tub transfer bench during session with excellent demo. Pt is at adequate level to d/c to [facility] with daughters (A). Pt less impulsive this session and much more confident in transfers    OT Goals Acute Rehab OT Goals OT Goal Formulation: With patient Time For Goal Achievement: 12/26/11 Potential to Achieve Goals: Good ADL Goals Pt Will Perform Lower Body Bathing: with set-up;Sitting, chair ADL Goal: Lower Body Bathing - Progress: Met Pt Will Perform Lower Body Dressing: with set-up;Sitting, bed;Sitting, chair;Unsupported ADL Goal: Lower Body Dressing - Progress: Met Pt Will  Perform Tub/Shower Transfer: with supervision;with DME ADL Goal: Tub/Shower Transfer - Progress: Met Miscellaneous OT Goals Miscellaneous OT Goal #1: Pt will complete bed mobility MOD I with Hob 20 degrees or less no bed rails as precursor to adls OT Goal: Miscellaneous Goal #1 - Progress: Progressing toward goals  Visit Information  Last OT Received On: 12/20/11 Assistance Needed: +1    Subjective Data      Prior Functioning       Cognition  Overall Cognitive Status: Appears within functional limits for tasks assessed/performed Arousal/Alertness: Awake/alert Orientation Level: Appears intact for tasks assessed Behavior During Session: [facility] for tasks performed    Mobility Transfers Transfers: Sit to Stand;Stand to Sit Sit to Stand: 5: Supervision;With upper extremity assist;From chair/3-in-1 Stand to Sit: 5: Supervision;With upper extremity assist;To chair/3-in-1;With armrests   Exercises    Balance    End of Session OT - End of Session Equipment Utilized During Treatment: Gait belt Activity Tolerance: Patient tolerated treatment well Patient left: in chair;with call bell/phone within reach Nurse Communication: Mobility status   Harrel Carina [facility] 12/20/2011, 11:27 AM Pager: [phone]

## 2011-12-21 DIAGNOSIS — N400 Enlarged prostate without lower urinary tract symptoms: Secondary | ICD-10-CM | POA: Diagnosis not present

## 2011-12-21 DIAGNOSIS — M6281 Muscle weakness (generalized): Secondary | ICD-10-CM | POA: Diagnosis not present

## 2011-12-21 DIAGNOSIS — S72143A Displaced intertrochanteric fracture of unspecified femur, initial encounter for closed fracture: Secondary | ICD-10-CM | POA: Diagnosis not present

## 2011-12-21 DIAGNOSIS — I10 Essential (primary) hypertension: Secondary | ICD-10-CM | POA: Diagnosis not present

## 2011-12-21 DIAGNOSIS — D62 Acute posthemorrhagic anemia: Secondary | ICD-10-CM | POA: Diagnosis not present

## 2011-12-21 DIAGNOSIS — R21 Rash and other nonspecific skin eruption: Secondary | ICD-10-CM | POA: Diagnosis not present

## 2011-12-21 DIAGNOSIS — E039 Hypothyroidism, unspecified: Secondary | ICD-10-CM | POA: Diagnosis not present

## 2011-12-21 DIAGNOSIS — S72019A Unspecified intracapsular fracture of unspecified femur, initial encounter for closed fracture: Secondary | ICD-10-CM | POA: Diagnosis not present

## 2011-12-21 DIAGNOSIS — M25559 Pain in unspecified hip: Secondary | ICD-10-CM | POA: Diagnosis not present

## 2011-12-21 DIAGNOSIS — S72009A Fracture of unspecified part of neck of unspecified femur, initial encounter for closed fracture: Secondary | ICD-10-CM | POA: Diagnosis not present

## 2011-12-21 DIAGNOSIS — R070 Pain in throat: Secondary | ICD-10-CM | POA: Diagnosis not present

## 2011-12-21 DIAGNOSIS — S7223XA Displaced subtrochanteric fracture of unspecified femur, initial encounter for closed fracture: Secondary | ICD-10-CM | POA: Diagnosis not present

## 2011-12-21 DIAGNOSIS — Z7901 Long term (current) use of anticoagulants: Secondary | ICD-10-CM | POA: Diagnosis not present

## 2011-12-21 DIAGNOSIS — IMO0002 Reserved for concepts with insufficient information to code with codable children: Secondary | ICD-10-CM | POA: Diagnosis not present

## 2011-12-21 DIAGNOSIS — Z4789 Encounter for other orthopedic aftercare: Secondary | ICD-10-CM | POA: Diagnosis not present

## 2011-12-21 DIAGNOSIS — R269 Unspecified abnormalities of gait and mobility: Secondary | ICD-10-CM | POA: Diagnosis not present

## 2011-12-21 DIAGNOSIS — R262 Difficulty in walking, not elsewhere classified: Secondary | ICD-10-CM | POA: Diagnosis not present

## 2011-12-21 DIAGNOSIS — K5900 Constipation, unspecified: Secondary | ICD-10-CM | POA: Diagnosis not present

## 2011-12-21 DIAGNOSIS — R7989 Other specified abnormal findings of blood chemistry: Secondary | ICD-10-CM | POA: Diagnosis not present

## 2011-12-21 LAB — PROTIME-INR
INR: 1.96 — ABNORMAL HIGH (ref 0.00–1.49)
Prothrombin Time: 22.7 seconds — ABNORMAL HIGH (ref 11.6–15.2)

## 2011-12-21 MED ORDER — WARFARIN SODIUM 2.5 MG PO TABS
2.5000 mg | ORAL_TABLET | Freq: Once | ORAL | Status: DC
  Filled 2011-12-21: qty 1

## 2011-12-21 NOTE — Progress Notes (Signed)
Physical Therapy Treatment Patient Details Name: Cory Burton MRN: [id number] DOB: Feb 25, 1918 Today's Date: 12/21/2011 Time: 6962-9528 PT Time Calculation (min): 25 min  PT Assessment / Plan / Recommendation Comments on Treatment Session  pt very motivated and looking forward to d/c to [facility], c/o left hip soreness, "not pain"    Follow Up Recommendations  Skilled nursing facility    Equipment Recommendations  Defer to next venue    Frequency Min 6X/week   Plan Discharge plan remains appropriate    Precautions / Restrictions Precautions Precautions: Fall Restrictions Weight Bearing Restrictions: No LLE Weight Bearing: Weight bearing as tolerated   Pertinent Vitals/Pain     Mobility  Bed Mobility Bed Mobility: Sit to Supine Supine to Sit: 4: Min assist Details for Bed Mobility Assistance: min assist with LEs; cues technique Transfers Transfers: Sit to Stand;Stand to Sit Sit to Stand: 4: Min guard;With upper extremity assist;From bed Stand to Sit: 4: Min guard;To chair/3-in-1;With armrests;With upper extremity assist Details for Transfer Assistance: cues for LE management and use of UEs to self assist Ambulation/Gait Ambulation/Gait Assistance: 4: Min guard Ambulation Distance (Feet): 130 Feet Assistive device: Rolling walker Ambulation/Gait Assistance Details: multimodal cues for sequence, posture, position from RW and increased heel contact on L Gait Pattern: Step-to pattern;Decreased stance time - left    Exercises General Exercises - Lower Extremity Ankle Circles/Pumps: AROM;15 reps;Both Quad Sets: AROM;Both;10 reps Heel Slides: AAROM;Left;10 reps;Seated Hip ABduction/ADduction: AAROM;Left;10 reps;Seated   PT Goals Acute Rehab PT Goals Time For Goal Achievement: 12/25/11 Potential to Achieve Goals: Good Pt will go Supine/Side to Sit: with supervision PT Goal: Supine/Side to Sit - Progress: Progressing toward goal Pt will go Sit to Stand: with supervision PT Goal:  Sit to Stand - Progress: Progressing toward goal Pt will go Stand to Sit: with supervision PT Goal: Stand to Sit - Progress: Progressing toward goal Pt will Ambulate: 51 - 150 feet;with supervision;with rolling walker PT Goal: Ambulate - Progress: Progressing toward goal  Visit Information  Last PT Received On: 12/21/11 Assistance Needed: +1    Subjective Data  Subjective: pretty well   Cognition  Overall Cognitive Status: Appears within functional limits for tasks assessed/performed Arousal/Alertness: Awake/alert Orientation Level: Appears intact for tasks assessed Behavior During Session: [facility] for tasks performed    Balance     End of Session PT - End of Session Activity Tolerance: Patient tolerated treatment well Patient left: in chair;with call bell/phone within reach    [facility] 12/21/2011, 9:55 AM

## 2011-12-21 NOTE — Progress Notes (Signed)
ANTICOAGULATION CONSULT NOTE - Follow Up Consult  Pharmacy Consult for Warfarin Indication: VTE prophylaxis  Allergies  Allergen Reactions  . Horse-Derived Products     Horse serum    Patient Measurements: Height: 5\' 9"  (175.3 cm) Weight: 140 lb (63.504 kg) IBW/kg (Calculated) : 70.7    Vital Signs: Temp: 97.9 F (36.6 C) (05/06 0600) Temp src: Oral (05/06 0600) BP: 136/77 mmHg (05/06 0600) Pulse Rate: 80  (05/06 0600)  Labs:  Basename 12/21/11 0405 12/20/11 0432 12/19/11 0410  HGB -- 9.4* 10.0*  HCT -- 27.3* 28.7*  PLT -- 201 195  APTT -- -- --  LABPROT 22.7* 19.4* 15.2  INR 1.96* 1.61* 1.18  HEPARINUNFRC -- -- --  CREATININE -- -- 0.98  CKTOTAL -- -- --  CKMB -- -- --  TROPONINI -- -- --   Estimated Creatinine Clearance: 44.1 ml/min (by C-G formula based on Cr of 0.98).   Medications:  Scheduled:     . docusate sodium  100 mg Oral BID  . enoxaparin  40 mg Subcutaneous Q24H  . finasteride  5 mg Oral Daily  . levothyroxine  137 mcg Oral Daily  . losartan  25 mg Oral Daily  . senna  1 tablet Oral BID  . Tamsulosin HCl  0.4 mg Oral Daily  . warfarin  2.5 mg Oral ONCE-1800  . Warfarin - Pharmacist Dosing Inpatient   Does not apply q1800   Inpatient warfarin doses (5/3  - 5/5): 4, 4, 2.5mg   Assessment:  78 y/o M with L hip fx, underwent ORIF on 5/2.  INR responding after warfarin x 3 doses.   Goal of Therapy:  INR 2-3   Plan:   Repeat warfarin 2.5mg  today.  DC Lovenox as per MD orders.  Daily PT/INR  Elie Goody, PharmD, BCPS Pager: [phone] 12/21/2011  8:30 AM

## 2011-12-21 NOTE — Discharge Summary (Signed)
Physician Discharge Summary  Patient ID: Cory Burton MRN: [id number] DOB/AGE: Oct 23, 1917 78 y.o.  Admit date: 12/16/2011 Discharge date: 12/21/2011  Admission Diagnoses:  Fracture, intertrochanteric, left femur  Discharge Diagnoses:  Principal Problem:  *Fracture, intertrochanteric, left femur   Past Medical History  Diagnosis Date  . Thyroid disease   . Hypertension   . BPH (benign prostatic hyperplasia)   . Fracture, intertrochanteric, left femur 12/17/2011    Surgeries: Procedure(s): COMPRESSION HIP on 12/16/2011 - 12/17/2011   Consultants (if any): Treatment Team:  Eulas Post, MD  Discharged Condition: Improved  Hospital Course: Dovid Bartko is an 78 y.o. male who was admitted 12/16/2011 with a diagnosis of Fracture, intertrochanteric, left femur and went to the operating room on 12/16/2011 - 12/17/2011 and underwent the above named procedures.    He was given perioperative antibiotics:  Anti-infectives     Start     Dose/Rate Route Frequency Ordered Stop   12/18/11 0000   ceFAZolin (ANCEF) IVPB 1 g/50 mL premix        1 g 100 mL/hr over 30 Minutes Intravenous Every 6 hours 12/17/11 2304 12/18/11 1325   12/17/11 0422   ceFAZolin (ANCEF) IVPB 1 g/50 mL premix        1 g 100 mL/hr over 30 Minutes Intravenous 60 min pre-op 12/17/11 0422 12/17/11 1757        .  He was given sequential compression devices, early ambulation, and chemoprophylaxis for DVT prophylaxis.  He benefited maximally from the hospital stay and there were no complications.    Recent vital signs:  Filed Vitals:   12/21/11 0600  BP: 136/77  Pulse: 80  Temp: 97.9 F (36.6 C)  Resp: 17    Recent laboratory studies:  Lab Results  Component Value Date   HGB 9.4* 12/20/2011   HGB 10.0* 12/19/2011   HGB 11.6* 12/18/2011   Lab Results  Component Value Date   WBC 11.0* 12/20/2011   PLT 201 12/20/2011   Lab Results  Component Value Date   INR 1.96* 12/21/2011   Lab Results  Component Value Date   NA  134* 12/19/2011   K 4.2 12/19/2011   CL 100 12/19/2011   CO2 25 12/19/2011   BUN 19 12/19/2011   CREATININE 0.98 12/19/2011   GLUCOSE 128* 12/19/2011    Discharge Medications:   Medication List  As of 12/21/2011  8:36 AM   TAKE these medications         calcium-vitamin D 500-200 MG-UNIT per tablet   Commonly known as: OSCAL WITH D   Take 1 tablet by mouth daily.      finasteride 5 MG tablet   Commonly known as: PROSCAR   Take 5 mg by mouth daily.      HYDROcodone-acetaminophen 5-325 MG per tablet   Commonly known as: NORCO   Take 1-2 tablets by mouth every 6 (six) hours as needed for pain. MAXIMUM TOTAL ACETAMINOPHEN DOSE IS 4000 MG PER DAY      levothyroxine 137 MCG tablet   Commonly known as: SYNTHROID, LEVOTHROID   Take 137 mcg by mouth daily.      losartan 50 MG tablet   Commonly known as: COZAAR   Take 25 mg by mouth daily.      Tamsulosin HCl 0.4 MG Caps   Commonly known as: FLOMAX   Take 0.4 mg by mouth daily.      warfarin 5 MG tablet   Commonly known as: COUMADIN   Take 1  tablet (5 mg total) by mouth daily.            Diagnostic Studies: Dg Hip Complete Left  12/17/2011  *RADIOLOGY REPORT*  Clinical Data: Post fall, now with left hip pain and deformity  LEFT HIP - COMPLETE 2+ VIEW  Comparison: None.  Findings: There is a comminuted, displaced intertrochanteric fracture of the left femur with varus angulation and foreshortening.  No definite pelvic fracture. Mild bilateral hip degenerative change.  Vascular calcifications.  IMPRESSION: Comminuted, displaced intertrochanteric fracture of the left femur with varus angulation and foreshortening.  Original Report Authenticated By: Waynard Reeds, M.D.   Dg Hip Operative Left  12/17/2011  *RADIOLOGY REPORT*  Clinical Data: Left hip fracture  OPERATIVE LEFT HIP  Comparison: Four digital C-arm fluoroscopic images obtained intraoperatively are compared to the preceding study of 12/17/2011  Findings: 38 seconds of fluoroscopy time  utilized. Images demonstrate placement of a plate and compression screw across a reduced intertrochanteric fracture of the left femur. No dislocation or additional focal bony abnormalities identified.  IMPRESSION: Post ORIF of reduced intertrochanteric fracture left femur.  Original Report Authenticated By: Lollie Marrow, M.D.   Dg Pelvis Portable  12/17/2011  *RADIOLOGY REPORT*  Clinical Data: Left hip fracture fixation.  PORTABLE PELVIS  Comparison: Earlier radiographs, same date.  Findings: The side plate and screws and dynamic hip screw are transfixing the intertrochanteric fracture with anatomic alignment. No complicating features.  The pubic symphysis and SI joints are intact.  IMPRESSION: Internal fixation of the intertrochanteric left hip fracture with anatomic reduction.  Original Report Authenticated By: P. Loralie Champagne, M.D.   Dg Chest Port 1 View  12/17/2011  *RADIOLOGY REPORT*  Clinical Data: Hip fracture, preoperative radiograph  PORTABLE CHEST - 1 VIEW  Comparison: None.  Findings: Cardiomegaly.  Interstitial prominence has a coarsened appearance.  No focal consolidation.  Aortic atherosclerosis. Osteopenia.  Bilateral glenohumeral and acromioclavicular DJD. Multilevel degenerative changes of the thoracic spine.  IMPRESSION: Coarse interstitial prominence may be chronic.  No focal consolidation.  Original Report Authenticated By: Waneta Martins, M.D.   Dg Hip Portable 1 View Left  12/17/2011  *RADIOLOGY REPORT*  Clinical Data: Left hip fracture fixation.  PORTABLE LEFT HIP - 1 VIEW  Comparison: Earlier films, same date.  Findings: The fixating hardware is in good position without complicating features.  Anatomic reduction of the fracture.  IMPRESSION: Internal fixation with anatomic reduction.  Original Report Authenticated By: P. Loralie Champagne, M.D.    Disposition: Final discharge disposition not confirmed  Discharge Orders    Future Orders Please Complete By Expires   Diet  general      Call MD / Call 911      Comments:   If you experience chest pain or shortness of breath, CALL 911 and be transported to the hospital emergency room.  If you develope a fever above 101 F, pus (white drainage) or increased drainage or redness at the wound, or calf pain, call your surgeon's office.   Discharge instructions      Comments:   Change dressing in 3 days and reapply fresh dressing, unless you have a splint (half cast).  If you have a splint/cast, just leave in place until your follow-up appointment.    Keep wounds dry for 3 weeks.  Leave steri-strips in place on skin.  Do not apply lotion or anything to the wound.   Constipation Prevention      Comments:   Drink plenty of fluids.  Prune juice may be helpful.  You may use a stool softener, such as Colace (over the counter) 100 mg twice a day.  Use MiraLax (over the counter) for constipation as needed.   Weight Bearing as taught in Physical Therapy      Comments:   Use a walker or crutches as instructed.   Do not sit on low chairs, stoools or toilet seats, as it may be difficult to get up from low surfaces      Discharge wound care:      Comments:   If you have a hip bandage, keep it clean and dry.  Change your bandage as instructed by your health care providers.  If your bandage has been discontinued, keep your incision clean and dry.  Pat dry after bathing.  DO NOT put lotion or powder on your incision.   DO NOT drive, shower or take a tub bath until instructed by your physician         Follow-up Information    Follow up with Easton Fetty P, MD in 2 weeks.   Contact information:   [facility] [address], [address] [place] [address] [phone]           Signed: Eulas Post 12/21/2011, 8:36 AM

## 2011-12-21 NOTE — Progress Notes (Signed)
Clinical Social Work Department CLINICAL SOCIAL WORK PLACEMENT NOTE 12/21/2011  Patient:  Cory Burton, Cory Burton  Account Number:  [id number] Admit date:  12/16/2011  Clinical Social Worker:  Cori Razor, LCSW  Date/time:  12/21/2011 12:52 PM  Clinical Social Work is seeking post-discharge placement for this patient at the following level of care:   SKILLED NURSING   (*CSW will update this form in Epic as items are completed)     Patient/family provided with [facility] Department of Clinical Social Work's list of facilities offering this level of care within the geographic area requested by the patient (or if unable, by the patient's family).    Patient/family informed of their freedom to choose among providers that offer the needed level of care, that participate in Medicare, Medicaid or managed care program needed by the patient, have an available bed and are willing to accept the patient.    Patient/family informed of [facility]' ownership interest in [facility], as well as of the fact that they are under no obligation to receive care at this facility.  PASARR submitted to EDS on 12/21/2011 PASARR number received from [employer] on 12/21/2011  FL2 transmitted to all facilities in geographic area requested by pt/family on  12/21/2011 FL2 transmitted to all facilities within larger geographic area on   Patient informed that his/her managed care company has contracts with or will negotiate with  certain facilities, including the following:     Patient/family informed of bed offers received:  12/21/2011 Patient chooses bed at [facility] Physician recommends and patient chooses bed at    Patient to be transferred to [facility] [place] on  12/21/2011 Patient to be transferred to facility by [facility]  The following physician request were entered in Epic:   Additional Comments: Pt had originally indicated to CSW that he planned to return to Independent Living at  [facility]. This am pt informed CSW he would be going to SNF at [facility]. SNF confirmed that they were expecting him today.  Cori Razor  LCSW  [phone]

## 2011-12-23 DIAGNOSIS — D62 Acute posthemorrhagic anemia: Secondary | ICD-10-CM

## 2011-12-23 DIAGNOSIS — I10 Essential (primary) hypertension: Secondary | ICD-10-CM | POA: Diagnosis not present

## 2011-12-23 DIAGNOSIS — N400 Enlarged prostate without lower urinary tract symptoms: Secondary | ICD-10-CM | POA: Diagnosis not present

## 2011-12-23 DIAGNOSIS — E039 Hypothyroidism, unspecified: Secondary | ICD-10-CM | POA: Diagnosis not present

## 2011-12-23 DIAGNOSIS — R21 Rash and other nonspecific skin eruption: Secondary | ICD-10-CM | POA: Diagnosis not present

## 2011-12-23 DIAGNOSIS — Z4789 Encounter for other orthopedic aftercare: Secondary | ICD-10-CM | POA: Diagnosis not present

## 2011-12-23 HISTORY — DX: Acute posthemorrhagic anemia: D62

## 2011-12-23 HISTORY — DX: Hypothyroidism, unspecified: E03.9

## 2011-12-24 DIAGNOSIS — Z7901 Long term (current) use of anticoagulants: Secondary | ICD-10-CM | POA: Diagnosis not present

## 2011-12-24 DIAGNOSIS — M48061 Spinal stenosis, lumbar region without neurogenic claudication: Secondary | ICD-10-CM

## 2011-12-24 DIAGNOSIS — I517 Cardiomegaly: Secondary | ICD-10-CM

## 2011-12-24 DIAGNOSIS — D62 Acute posthemorrhagic anemia: Secondary | ICD-10-CM | POA: Diagnosis not present

## 2011-12-24 DIAGNOSIS — I10 Essential (primary) hypertension: Secondary | ICD-10-CM | POA: Diagnosis not present

## 2011-12-24 DIAGNOSIS — R21 Rash and other nonspecific skin eruption: Secondary | ICD-10-CM | POA: Diagnosis not present

## 2011-12-24 DIAGNOSIS — R7989 Other specified abnormal findings of blood chemistry: Secondary | ICD-10-CM

## 2011-12-24 DIAGNOSIS — IMO0002 Reserved for concepts with insufficient information to code with codable children: Secondary | ICD-10-CM

## 2011-12-24 DIAGNOSIS — S72143A Displaced intertrochanteric fracture of unspecified femur, initial encounter for closed fracture: Secondary | ICD-10-CM | POA: Diagnosis not present

## 2011-12-24 DIAGNOSIS — H9190 Unspecified hearing loss, unspecified ear: Secondary | ICD-10-CM

## 2011-12-24 HISTORY — DX: Unspecified hearing loss, unspecified ear: H91.90

## 2011-12-24 HISTORY — DX: Other specified abnormal findings of blood chemistry: R79.89

## 2011-12-24 HISTORY — DX: Reserved for concepts with insufficient information to code with codable children: IMO0002

## 2011-12-24 HISTORY — DX: Cardiomegaly: I51.7

## 2011-12-24 HISTORY — DX: Spinal stenosis, lumbar region without neurogenic claudication: M48.061

## 2011-12-28 DIAGNOSIS — K5900 Constipation, unspecified: Secondary | ICD-10-CM | POA: Diagnosis not present

## 2011-12-28 DIAGNOSIS — D62 Acute posthemorrhagic anemia: Secondary | ICD-10-CM | POA: Diagnosis not present

## 2011-12-28 DIAGNOSIS — I10 Essential (primary) hypertension: Secondary | ICD-10-CM | POA: Diagnosis not present

## 2011-12-28 DIAGNOSIS — Z4789 Encounter for other orthopedic aftercare: Secondary | ICD-10-CM | POA: Diagnosis not present

## 2011-12-28 DIAGNOSIS — N400 Enlarged prostate without lower urinary tract symptoms: Secondary | ICD-10-CM | POA: Diagnosis not present

## 2011-12-28 DIAGNOSIS — Z7901 Long term (current) use of anticoagulants: Secondary | ICD-10-CM | POA: Diagnosis not present

## 2011-12-30 ENCOUNTER — Encounter (HOSPITAL_COMMUNITY): Payer: Self-pay | Admitting: Orthopedic Surgery

## 2012-01-04 DIAGNOSIS — S72143A Displaced intertrochanteric fracture of unspecified femur, initial encounter for closed fracture: Secondary | ICD-10-CM | POA: Diagnosis not present

## 2012-01-18 DIAGNOSIS — R070 Pain in throat: Secondary | ICD-10-CM | POA: Diagnosis not present

## 2012-01-18 DIAGNOSIS — I10 Essential (primary) hypertension: Secondary | ICD-10-CM | POA: Diagnosis not present

## 2012-01-18 DIAGNOSIS — D62 Acute posthemorrhagic anemia: Secondary | ICD-10-CM | POA: Diagnosis not present

## 2012-01-28 DIAGNOSIS — R269 Unspecified abnormalities of gait and mobility: Secondary | ICD-10-CM | POA: Diagnosis not present

## 2012-01-29 DIAGNOSIS — R269 Unspecified abnormalities of gait and mobility: Secondary | ICD-10-CM | POA: Diagnosis not present

## 2012-02-01 DIAGNOSIS — Z4789 Encounter for other orthopedic aftercare: Secondary | ICD-10-CM | POA: Diagnosis not present

## 2012-02-01 DIAGNOSIS — S72143A Displaced intertrochanteric fracture of unspecified femur, initial encounter for closed fracture: Secondary | ICD-10-CM | POA: Diagnosis not present

## 2012-02-02 DIAGNOSIS — R269 Unspecified abnormalities of gait and mobility: Secondary | ICD-10-CM | POA: Diagnosis not present

## 2012-02-03 DIAGNOSIS — R269 Unspecified abnormalities of gait and mobility: Secondary | ICD-10-CM | POA: Diagnosis not present

## 2012-02-04 DIAGNOSIS — R269 Unspecified abnormalities of gait and mobility: Secondary | ICD-10-CM | POA: Diagnosis not present

## 2012-02-05 DIAGNOSIS — R269 Unspecified abnormalities of gait and mobility: Secondary | ICD-10-CM | POA: Diagnosis not present

## 2012-02-08 DIAGNOSIS — M84453A Pathological fracture, unspecified femur, initial encounter for fracture: Secondary | ICD-10-CM | POA: Diagnosis not present

## 2012-02-08 DIAGNOSIS — M810 Age-related osteoporosis without current pathological fracture: Secondary | ICD-10-CM | POA: Diagnosis not present

## 2012-02-09 DIAGNOSIS — R269 Unspecified abnormalities of gait and mobility: Secondary | ICD-10-CM | POA: Diagnosis not present

## 2012-02-10 DIAGNOSIS — R269 Unspecified abnormalities of gait and mobility: Secondary | ICD-10-CM | POA: Diagnosis not present

## 2012-02-12 DIAGNOSIS — R269 Unspecified abnormalities of gait and mobility: Secondary | ICD-10-CM | POA: Diagnosis not present

## 2012-02-16 DIAGNOSIS — R269 Unspecified abnormalities of gait and mobility: Secondary | ICD-10-CM | POA: Diagnosis not present

## 2012-02-16 DIAGNOSIS — M6281 Muscle weakness (generalized): Secondary | ICD-10-CM | POA: Diagnosis not present

## 2012-02-17 DIAGNOSIS — R269 Unspecified abnormalities of gait and mobility: Secondary | ICD-10-CM | POA: Diagnosis not present

## 2012-02-17 DIAGNOSIS — M6281 Muscle weakness (generalized): Secondary | ICD-10-CM | POA: Diagnosis not present

## 2012-02-19 DIAGNOSIS — M6281 Muscle weakness (generalized): Secondary | ICD-10-CM | POA: Diagnosis not present

## 2012-02-19 DIAGNOSIS — R269 Unspecified abnormalities of gait and mobility: Secondary | ICD-10-CM | POA: Diagnosis not present

## 2012-02-23 DIAGNOSIS — R5383 Other fatigue: Secondary | ICD-10-CM | POA: Diagnosis not present

## 2012-02-23 DIAGNOSIS — R5381 Other malaise: Secondary | ICD-10-CM | POA: Diagnosis not present

## 2012-02-23 DIAGNOSIS — M810 Age-related osteoporosis without current pathological fracture: Secondary | ICD-10-CM | POA: Diagnosis not present

## 2012-02-24 DIAGNOSIS — M6281 Muscle weakness (generalized): Secondary | ICD-10-CM | POA: Diagnosis not present

## 2012-02-24 DIAGNOSIS — R269 Unspecified abnormalities of gait and mobility: Secondary | ICD-10-CM | POA: Diagnosis not present

## 2012-02-25 DIAGNOSIS — R269 Unspecified abnormalities of gait and mobility: Secondary | ICD-10-CM | POA: Diagnosis not present

## 2012-02-25 DIAGNOSIS — M6281 Muscle weakness (generalized): Secondary | ICD-10-CM | POA: Diagnosis not present

## 2012-03-03 DIAGNOSIS — M810 Age-related osteoporosis without current pathological fracture: Secondary | ICD-10-CM | POA: Diagnosis not present

## 2012-03-14 DIAGNOSIS — Z4789 Encounter for other orthopedic aftercare: Secondary | ICD-10-CM | POA: Diagnosis not present

## 2012-03-14 DIAGNOSIS — S72143A Displaced intertrochanteric fracture of unspecified femur, initial encounter for closed fracture: Secondary | ICD-10-CM | POA: Diagnosis not present

## 2012-03-17 DIAGNOSIS — M810 Age-related osteoporosis without current pathological fracture: Secondary | ICD-10-CM | POA: Diagnosis not present

## 2012-04-28 DIAGNOSIS — E039 Hypothyroidism, unspecified: Secondary | ICD-10-CM | POA: Diagnosis not present

## 2012-04-28 DIAGNOSIS — R609 Edema, unspecified: Secondary | ICD-10-CM

## 2012-04-28 DIAGNOSIS — D62 Acute posthemorrhagic anemia: Secondary | ICD-10-CM | POA: Diagnosis not present

## 2012-04-28 DIAGNOSIS — N400 Enlarged prostate without lower urinary tract symptoms: Secondary | ICD-10-CM | POA: Diagnosis not present

## 2012-04-28 DIAGNOSIS — I10 Essential (primary) hypertension: Secondary | ICD-10-CM | POA: Diagnosis not present

## 2012-04-28 HISTORY — DX: Edema, unspecified: R60.9

## 2012-05-05 DIAGNOSIS — E039 Hypothyroidism, unspecified: Secondary | ICD-10-CM | POA: Diagnosis not present

## 2012-05-05 DIAGNOSIS — D649 Anemia, unspecified: Secondary | ICD-10-CM | POA: Diagnosis not present

## 2012-05-05 DIAGNOSIS — E785 Hyperlipidemia, unspecified: Secondary | ICD-10-CM | POA: Diagnosis not present

## 2012-05-05 DIAGNOSIS — I10 Essential (primary) hypertension: Secondary | ICD-10-CM | POA: Diagnosis not present

## 2012-05-06 DIAGNOSIS — M545 Low back pain, unspecified: Secondary | ICD-10-CM | POA: Diagnosis not present

## 2012-05-06 DIAGNOSIS — F4322 Adjustment disorder with anxiety: Secondary | ICD-10-CM | POA: Diagnosis not present

## 2012-05-06 DIAGNOSIS — M1990 Unspecified osteoarthritis, unspecified site: Secondary | ICD-10-CM | POA: Diagnosis not present

## 2012-05-12 DIAGNOSIS — H353190 Nonexudative age-related macular degeneration, unspecified eye, stage unspecified: Secondary | ICD-10-CM | POA: Diagnosis not present

## 2012-05-12 DIAGNOSIS — Z961 Presence of intraocular lens: Secondary | ICD-10-CM | POA: Diagnosis not present

## 2012-05-27 DIAGNOSIS — Z23 Encounter for immunization: Secondary | ICD-10-CM | POA: Diagnosis not present

## 2012-06-28 DIAGNOSIS — Z85828 Personal history of other malignant neoplasm of skin: Secondary | ICD-10-CM | POA: Diagnosis not present

## 2012-06-28 DIAGNOSIS — L570 Actinic keratosis: Secondary | ICD-10-CM | POA: Diagnosis not present

## 2012-06-28 DIAGNOSIS — D239 Other benign neoplasm of skin, unspecified: Secondary | ICD-10-CM | POA: Diagnosis not present

## 2012-06-28 DIAGNOSIS — L723 Sebaceous cyst: Secondary | ICD-10-CM | POA: Diagnosis not present

## 2012-07-19 DIAGNOSIS — N3020 Other chronic cystitis without hematuria: Secondary | ICD-10-CM | POA: Diagnosis not present

## 2012-07-19 DIAGNOSIS — R972 Elevated prostate specific antigen [PSA]: Secondary | ICD-10-CM | POA: Diagnosis not present

## 2012-07-19 DIAGNOSIS — N400 Enlarged prostate without lower urinary tract symptoms: Secondary | ICD-10-CM | POA: Diagnosis not present

## 2012-08-25 DIAGNOSIS — D649 Anemia, unspecified: Secondary | ICD-10-CM | POA: Diagnosis not present

## 2012-08-25 DIAGNOSIS — M810 Age-related osteoporosis without current pathological fracture: Secondary | ICD-10-CM

## 2012-08-25 DIAGNOSIS — M48061 Spinal stenosis, lumbar region without neurogenic claudication: Secondary | ICD-10-CM | POA: Diagnosis not present

## 2012-08-25 DIAGNOSIS — R609 Edema, unspecified: Secondary | ICD-10-CM | POA: Diagnosis not present

## 2012-08-25 DIAGNOSIS — E039 Hypothyroidism, unspecified: Secondary | ICD-10-CM | POA: Diagnosis not present

## 2012-08-25 DIAGNOSIS — I10 Essential (primary) hypertension: Secondary | ICD-10-CM | POA: Diagnosis not present

## 2012-08-25 DIAGNOSIS — R7989 Other specified abnormal findings of blood chemistry: Secondary | ICD-10-CM | POA: Diagnosis not present

## 2012-08-25 HISTORY — DX: Age-related osteoporosis without current pathological fracture: M81.0

## 2012-09-22 DIAGNOSIS — E559 Vitamin D deficiency, unspecified: Secondary | ICD-10-CM | POA: Diagnosis not present

## 2012-09-22 DIAGNOSIS — S72143A Displaced intertrochanteric fracture of unspecified femur, initial encounter for closed fracture: Secondary | ICD-10-CM | POA: Diagnosis not present

## 2012-09-22 DIAGNOSIS — M810 Age-related osteoporosis without current pathological fracture: Secondary | ICD-10-CM | POA: Diagnosis not present

## 2012-09-22 DIAGNOSIS — R5381 Other malaise: Secondary | ICD-10-CM | POA: Diagnosis not present

## 2012-10-20 DIAGNOSIS — D649 Anemia, unspecified: Secondary | ICD-10-CM | POA: Diagnosis not present

## 2012-10-20 DIAGNOSIS — E039 Hypothyroidism, unspecified: Secondary | ICD-10-CM | POA: Diagnosis not present

## 2012-10-20 DIAGNOSIS — I10 Essential (primary) hypertension: Secondary | ICD-10-CM | POA: Diagnosis not present

## 2012-10-27 DIAGNOSIS — I10 Essential (primary) hypertension: Secondary | ICD-10-CM | POA: Diagnosis not present

## 2012-10-27 DIAGNOSIS — E039 Hypothyroidism, unspecified: Secondary | ICD-10-CM | POA: Diagnosis not present

## 2012-10-28 DIAGNOSIS — M810 Age-related osteoporosis without current pathological fracture: Secondary | ICD-10-CM | POA: Diagnosis not present

## 2012-10-28 DIAGNOSIS — E559 Vitamin D deficiency, unspecified: Secondary | ICD-10-CM | POA: Diagnosis not present

## 2013-01-10 DIAGNOSIS — D239 Other benign neoplasm of skin, unspecified: Secondary | ICD-10-CM | POA: Diagnosis not present

## 2013-01-10 DIAGNOSIS — L570 Actinic keratosis: Secondary | ICD-10-CM | POA: Diagnosis not present

## 2013-01-10 DIAGNOSIS — L821 Other seborrheic keratosis: Secondary | ICD-10-CM | POA: Diagnosis not present

## 2013-02-06 DIAGNOSIS — L02219 Cutaneous abscess of trunk, unspecified: Secondary | ICD-10-CM | POA: Diagnosis not present

## 2013-02-06 DIAGNOSIS — L03319 Cellulitis of trunk, unspecified: Secondary | ICD-10-CM | POA: Diagnosis not present

## 2013-02-13 ENCOUNTER — Other Ambulatory Visit: Payer: Self-pay | Admitting: Geriatric Medicine

## 2013-02-13 MED ORDER — LEVOTHYROXINE SODIUM 137 MCG PO TABS: 137.0000 ug | ORAL_TABLET | Freq: Every day | ORAL | Status: DC

## 2013-03-06 ENCOUNTER — Other Ambulatory Visit: Payer: Self-pay | Admitting: Dermatology

## 2013-03-06 DIAGNOSIS — C44711 Basal cell carcinoma of skin of unspecified lower limb, including hip: Secondary | ICD-10-CM | POA: Diagnosis not present

## 2013-03-06 DIAGNOSIS — C44621 Squamous cell carcinoma of skin of unspecified upper limb, including shoulder: Secondary | ICD-10-CM | POA: Diagnosis not present

## 2013-03-06 DIAGNOSIS — C44721 Squamous cell carcinoma of skin of unspecified lower limb, including hip: Secondary | ICD-10-CM | POA: Diagnosis not present

## 2013-03-07 ENCOUNTER — Other Ambulatory Visit: Payer: Self-pay | Admitting: Geriatric Medicine

## 2013-04-18 DIAGNOSIS — E039 Hypothyroidism, unspecified: Secondary | ICD-10-CM | POA: Diagnosis not present

## 2013-04-18 DIAGNOSIS — I10 Essential (primary) hypertension: Secondary | ICD-10-CM | POA: Diagnosis not present

## 2013-04-18 LAB — BASIC METABOLIC PANEL WITH GFR
BUN: 16 mg/dL (ref 4–21)
Creatinine: 1.1 mg/dL (ref 0.6–1.3)
Glucose: 83 mg/dL
Potassium: 4.7 mmol/L (ref 3.4–5.3)
Sodium: 138 mmol/L (ref 137–147)

## 2013-04-18 LAB — HEPATIC FUNCTION PANEL
ALT: 12 U/L (ref 10–40)
AST: 21 U/L (ref 14–40)
Alkaline Phosphatase: 44 U/L (ref 25–125)
Bilirubin, Total: 0.6 mg/dL

## 2013-04-18 LAB — TSH: TSH: 0.63 u[IU]/mL (ref 0.41–5.90)

## 2013-04-24 DIAGNOSIS — R5381 Other malaise: Secondary | ICD-10-CM | POA: Diagnosis not present

## 2013-04-24 DIAGNOSIS — E559 Vitamin D deficiency, unspecified: Secondary | ICD-10-CM | POA: Diagnosis not present

## 2013-04-24 DIAGNOSIS — M810 Age-related osteoporosis without current pathological fracture: Secondary | ICD-10-CM | POA: Diagnosis not present

## 2013-04-27 ENCOUNTER — Non-Acute Institutional Stay: Payer: Medicare Other | Admitting: Nurse Practitioner

## 2013-04-27 ENCOUNTER — Encounter: Payer: Self-pay | Admitting: Nurse Practitioner

## 2013-04-27 VITALS — BP 118/62 | HR 76 | Ht 65.5 in | Wt 144.0 lb

## 2013-04-27 DIAGNOSIS — E039 Hypothyroidism, unspecified: Secondary | ICD-10-CM | POA: Diagnosis not present

## 2013-04-27 DIAGNOSIS — R609 Edema, unspecified: Secondary | ICD-10-CM

## 2013-04-27 DIAGNOSIS — N400 Enlarged prostate without lower urinary tract symptoms: Secondary | ICD-10-CM

## 2013-04-27 DIAGNOSIS — I10 Essential (primary) hypertension: Secondary | ICD-10-CM

## 2013-04-28 DIAGNOSIS — E039 Hypothyroidism, unspecified: Secondary | ICD-10-CM | POA: Insufficient documentation

## 2013-04-28 DIAGNOSIS — N400 Enlarged prostate without lower urinary tract symptoms: Secondary | ICD-10-CM | POA: Insufficient documentation

## 2013-04-28 DIAGNOSIS — I10 Essential (primary) hypertension: Secondary | ICD-10-CM | POA: Insufficient documentation

## 2013-04-28 DIAGNOSIS — R609 Edema, unspecified: Secondary | ICD-10-CM | POA: Insufficient documentation

## 2013-04-28 NOTE — Assessment & Plan Note (Signed)
Controlled on Losartan 25mg , Bun/creat 16/1.08 04/18/13

## 2013-04-28 NOTE — Assessment & Plan Note (Signed)
Takes Levothyroxine daily, will check TSH prior to next appointment.

## 2013-04-28 NOTE — Assessment & Plan Note (Addendum)
Trace left pedal edema noted. Takes Furosemide 10mg  daily. Chronic since the left hip surgery 12/2011-gradually getting better. Now he realized that his right foot is bigger than the left.

## 2013-04-28 NOTE — Assessment & Plan Note (Signed)
No urinary retention, takes Tamsulosin and Finasteride.   

## 2013-04-28 NOTE — Progress Notes (Signed)
Patient ID: Cory Burton, male   DOB: [birth year], 79 y.o.   MRN: [id number] Code Status: Living Will  Allergies  Allergen Reactions  . Horse-Derived Products     Horse serum    Chief Complaint  Patient presents with  . Medical Managment of Chronic Issues    thyroid, blood pressure, anemia    HPI: Patient is a 79 y.o. male seen in the clinic at [facility] today for evaluation of thyroid, HTN, edema,  and other chronic medical conditions.  Problem List Items Addressed This Visit   BPH (benign prostatic hyperplasia)     No urinary retention, takes Tamsulosin and Finasteride.     Edema     Trace left pedal edema noted. Takes Furosemide 10mg  daily. Chronic since the left hip surgery 12/2011-gradually getting better. Now he realized that his right foot is bigger than the left.     HTN (hypertension) - Primary     Controlled on Losartan 25mg , Bun/creat 16/1.08 04/18/13    Relevant Medications      furosemide (LASIX) 20 MG tablet   Unspecified hypothyroidism     Takes Levothyroxine daily, will check TSH prior to next appointment.        Review of Systems:  Review of Systems  Constitutional: Negative for fever, chills, weight loss, malaise/fatigue and diaphoresis.  HENT: Positive for hearing loss. Negative for ear pain, nosebleeds, congestion, neck pain, tinnitus and ear discharge.   Eyes: Negative for blurred vision, double vision, photophobia, pain, discharge and redness.  Respiratory: Negative for cough, hemoptysis, sputum production, shortness of breath, wheezing and stridor.   Cardiovascular: Positive for leg swelling. Negative for chest pain, orthopnea, claudication and PND.       Trace left pedal edema since the left hip fx surgical repair 12/2011  Gastrointestinal: Negative for heartburn, nausea, vomiting, abdominal pain, diarrhea, constipation, blood in stool and melena.  Genitourinary: Positive for frequency. Negative for dysuria, urgency, hematuria and flank  pain.  Musculoskeletal: Negative for myalgias, back pain, joint pain and falls.  Skin: Negative for itching and rash.  Neurological: Negative for dizziness, tingling, tremors, sensory change, speech change, focal weakness, seizures, loss of consciousness, weakness and headaches.  Endo/Heme/Allergies: Negative for environmental allergies and polydipsia. Does not bruise/bleed easily.  Psychiatric/Behavioral: Negative for depression, suicidal ideas, hallucinations, memory loss and substance abuse. The patient is not nervous/anxious and does not have insomnia.    Past Medical History  Diagnosis Date  . Thyroid disease   . Hypertension   . BPH (benign prostatic hyperplasia)   . Fracture, intertrochanteric, left femur 12/17/2011  . Senile osteoporosis 08/25/2012  . Edema 04/28/2012  . Unspecified hearing loss 12/24/2011  . Cardiomegaly 12/24/2011  . Degeneration of thoracic or thoracolumbar intervertebral disc 12/24/2011  . Spinal stenosis, lumbar region, without neurogenic claudication 12/24/2011  . Other abnormal blood chemistry 12/24/2011  . Unspecified hypothyroidism 12/23/2011  . Acute posthemorrhagic anemia 12/23/2011   Past Surgical History  Procedure Laterality Date  . Foot surgery    . Compression hip screw  12/17/2011    Procedure: COMPRESSION HIP;  Surgeon: Eulas Post, MD;  Location: [facility];  Service: Orthopedics;  Laterality: Left;   Social History:   reports that he quit smoking about 28 years ago. His smoking use included Pipe. He has never used smokeless tobacco. He reports that he drinks about 0.6 ounces of alcohol per week. He reports that he does not use illicit drugs.   Medications: Patient's Medications  New Prescriptions  No medications on file  Previous Medications   B COMPLEX VITAMINS (VITAMIN B COMPLEX PO)    Take by mouth. Take one tablet daily   CALCIUM-VITAMIN D (OSCAL WITH D) 500-200 MG-UNIT PER TABLET    Take 1 tablet by mouth daily.   DENOSUMAB (PROLIA) 60 MG/ML SOLN  INJECTION    Inject 60 mg into the skin every 6 (six) months. Administer in upper arm, thigh, or abdomen   FINASTERIDE (PROSCAR) 5 MG TABLET    Take 5 mg by mouth daily.   FUROSEMIDE (LASIX) 20 MG TABLET    Take 20 mg by mouth. Take 1/2 tablet daily for edema   LEVOTHYROXINE (SYNTHROID, LEVOTHROID) 137 MCG TABLET    Take 1 tablet (137 mcg total) by mouth daily.   LOSARTAN (COZAAR) 50 MG TABLET    Take 25 mg by mouth. Take 1/2 tablet of 25mg  = 12.5mg  daily   TAMSULOSIN HCL (FLOMAX) 0.4 MG CAPS    Take 0.4 mg by mouth daily.   VITAMIN C (ASCORBIC ACID) 500 MG TABLET    Take 500 mg by mouth daily.   VITAMIN E (VITAMIN E) 400 UNIT CAPSULE    Take 400 Units by mouth daily.  Modified Medications   No medications on file  Discontinued Medications   WARFARIN (COUMADIN) 5 MG TABLET    Take 1 tablet (5 mg total) by mouth daily.   Physical Exam: Physical Exam  Constitutional: He is oriented to person, place, and time. He appears well-developed and well-nourished. No distress.  HENT:  Head: Normocephalic and atraumatic.  Right Ear: External ear normal.  Left Ear: External ear normal.  Nose: Nose normal.  Mouth/Throat: Oropharynx is clear and moist. No oropharyngeal exudate.  Eyes: Conjunctivae and EOM are normal. Pupils are equal, round, and reactive to light. Right eye exhibits no discharge. Left eye exhibits no discharge. No scleral icterus.  Neck: Normal range of motion. Neck supple. No JVD present. No tracheal deviation present. No thyromegaly present.  Cardiovascular: Normal rate, regular rhythm, normal heart sounds and intact distal pulses.   No murmur heard. Pulmonary/Chest: Effort normal and breath sounds normal. No stridor. No respiratory distress. He has no wheezes. He has no rales. He exhibits no tenderness.  Abdominal: Soft. Bowel sounds are normal. He exhibits no distension. There is no tenderness. There is no rebound and no guarding.  Musculoskeletal: Normal range of motion. He exhibits  edema. He exhibits no tenderness.  Trace left pedal edema since the left hip surgery 12/2011.   Lymphadenopathy:    He has no cervical adenopathy.  Neurological: He is alert and oriented to person, place, and time. He has normal reflexes. He displays normal reflexes. No cranial nerve deficit. He exhibits normal muscle tone. Coordination normal.  Skin: Skin is warm and dry. No rash noted. He is not diaphoretic. No erythema. No pallor.  Psychiatric: He has a normal mood and affect. His behavior is normal. Judgment and thought content normal.    Filed Vitals:   04/27/13 1423  BP: 118/62  Pulse: 76  Height: 5' 5.5" (1.664 m)  Weight: 144 lb (65.318 kg)   Labs reviewed: Basic Metabolic Panel:  Recent Labs  16/10/96  NA 138  K 4.7  BUN 16  CREATININE 1.1  TSH 0.63   Assessment/Plan HTN (hypertension) Controlled on Losartan 25mg , Bun/creat 16/1.08 04/18/13  Edema Trace left pedal edema noted. Takes Furosemide 10mg  daily. Chronic since the left hip surgery 12/2011-gradually getting better. Now he realized that his right  foot is bigger than the left.   BPH (benign prostatic hyperplasia) No urinary retention, takes Tamsulosin and Finasteride.   Unspecified hypothyroidism Takes Levothyroxine daily, will check TSH prior to next appointment.     Family/ Staff Communication: observe the patient.   Goals of Care: [place]  Labs/tests ordered: CBC, CMP, TSH prior to next appointment in 6 months.

## 2013-05-03 DIAGNOSIS — M810 Age-related osteoporosis without current pathological fracture: Secondary | ICD-10-CM | POA: Diagnosis not present

## 2013-05-03 DIAGNOSIS — E559 Vitamin D deficiency, unspecified: Secondary | ICD-10-CM | POA: Diagnosis not present

## 2013-05-15 DIAGNOSIS — M545 Low back pain, unspecified: Secondary | ICD-10-CM | POA: Diagnosis not present

## 2013-05-22 ENCOUNTER — Encounter: Payer: Self-pay | Admitting: Internal Medicine

## 2013-05-26 DIAGNOSIS — M47817 Spondylosis without myelopathy or radiculopathy, lumbosacral region: Secondary | ICD-10-CM | POA: Diagnosis not present

## 2013-06-05 DIAGNOSIS — M47817 Spondylosis without myelopathy or radiculopathy, lumbosacral region: Secondary | ICD-10-CM | POA: Diagnosis not present

## 2013-06-05 DIAGNOSIS — Z23 Encounter for immunization: Secondary | ICD-10-CM | POA: Diagnosis not present

## 2013-06-12 DIAGNOSIS — L570 Actinic keratosis: Secondary | ICD-10-CM | POA: Diagnosis not present

## 2013-06-12 DIAGNOSIS — L821 Other seborrheic keratosis: Secondary | ICD-10-CM | POA: Diagnosis not present

## 2013-06-12 DIAGNOSIS — L259 Unspecified contact dermatitis, unspecified cause: Secondary | ICD-10-CM | POA: Diagnosis not present

## 2013-06-26 DIAGNOSIS — M47817 Spondylosis without myelopathy or radiculopathy, lumbosacral region: Secondary | ICD-10-CM | POA: Diagnosis not present

## 2013-06-26 DIAGNOSIS — M48061 Spinal stenosis, lumbar region without neurogenic claudication: Secondary | ICD-10-CM | POA: Diagnosis not present

## 2013-07-06 DIAGNOSIS — M48061 Spinal stenosis, lumbar region without neurogenic claudication: Secondary | ICD-10-CM | POA: Diagnosis not present

## 2013-07-06 DIAGNOSIS — M47817 Spondylosis without myelopathy or radiculopathy, lumbosacral region: Secondary | ICD-10-CM | POA: Diagnosis not present

## 2013-07-25 DIAGNOSIS — M48061 Spinal stenosis, lumbar region without neurogenic claudication: Secondary | ICD-10-CM | POA: Diagnosis not present

## 2013-07-25 DIAGNOSIS — M47817 Spondylosis without myelopathy or radiculopathy, lumbosacral region: Secondary | ICD-10-CM | POA: Diagnosis not present

## 2013-07-25 DIAGNOSIS — R3915 Urgency of urination: Secondary | ICD-10-CM | POA: Diagnosis not present

## 2013-07-25 DIAGNOSIS — N400 Enlarged prostate without lower urinary tract symptoms: Secondary | ICD-10-CM | POA: Diagnosis not present

## 2013-08-01 DIAGNOSIS — M545 Low back pain, unspecified: Secondary | ICD-10-CM | POA: Diagnosis not present

## 2013-08-01 DIAGNOSIS — M6281 Muscle weakness (generalized): Secondary | ICD-10-CM | POA: Diagnosis not present

## 2013-08-03 DIAGNOSIS — M545 Low back pain, unspecified: Secondary | ICD-10-CM | POA: Diagnosis not present

## 2013-08-03 DIAGNOSIS — M6281 Muscle weakness (generalized): Secondary | ICD-10-CM | POA: Diagnosis not present

## 2013-08-08 DIAGNOSIS — M545 Low back pain, unspecified: Secondary | ICD-10-CM | POA: Diagnosis not present

## 2013-08-08 DIAGNOSIS — M6281 Muscle weakness (generalized): Secondary | ICD-10-CM | POA: Diagnosis not present

## 2013-08-09 DIAGNOSIS — M6281 Muscle weakness (generalized): Secondary | ICD-10-CM | POA: Diagnosis not present

## 2013-08-09 DIAGNOSIS — M545 Low back pain, unspecified: Secondary | ICD-10-CM | POA: Diagnosis not present

## 2013-08-14 DIAGNOSIS — L259 Unspecified contact dermatitis, unspecified cause: Secondary | ICD-10-CM | POA: Diagnosis not present

## 2013-08-16 DIAGNOSIS — M545 Low back pain, unspecified: Secondary | ICD-10-CM | POA: Diagnosis not present

## 2013-08-16 DIAGNOSIS — M6281 Muscle weakness (generalized): Secondary | ICD-10-CM | POA: Diagnosis not present

## 2013-08-21 DIAGNOSIS — M545 Low back pain, unspecified: Secondary | ICD-10-CM | POA: Diagnosis not present

## 2013-08-21 DIAGNOSIS — H353190 Nonexudative age-related macular degeneration, unspecified eye, stage unspecified: Secondary | ICD-10-CM | POA: Diagnosis not present

## 2013-08-21 DIAGNOSIS — H18519 Endothelial corneal dystrophy, unspecified eye: Secondary | ICD-10-CM | POA: Diagnosis not present

## 2013-08-21 DIAGNOSIS — M6281 Muscle weakness (generalized): Secondary | ICD-10-CM | POA: Diagnosis not present

## 2013-08-23 DIAGNOSIS — M6281 Muscle weakness (generalized): Secondary | ICD-10-CM | POA: Diagnosis not present

## 2013-08-23 DIAGNOSIS — M545 Low back pain, unspecified: Secondary | ICD-10-CM | POA: Diagnosis not present

## 2013-08-25 DIAGNOSIS — M545 Low back pain, unspecified: Secondary | ICD-10-CM | POA: Diagnosis not present

## 2013-08-25 DIAGNOSIS — M6281 Muscle weakness (generalized): Secondary | ICD-10-CM | POA: Diagnosis not present

## 2013-08-30 DIAGNOSIS — L259 Unspecified contact dermatitis, unspecified cause: Secondary | ICD-10-CM | POA: Diagnosis not present

## 2013-08-31 DIAGNOSIS — M545 Low back pain, unspecified: Secondary | ICD-10-CM | POA: Diagnosis not present

## 2013-08-31 DIAGNOSIS — M6281 Muscle weakness (generalized): Secondary | ICD-10-CM | POA: Diagnosis not present

## 2013-09-01 DIAGNOSIS — M545 Low back pain, unspecified: Secondary | ICD-10-CM | POA: Diagnosis not present

## 2013-09-01 DIAGNOSIS — M6281 Muscle weakness (generalized): Secondary | ICD-10-CM | POA: Diagnosis not present

## 2013-09-06 DIAGNOSIS — M545 Low back pain, unspecified: Secondary | ICD-10-CM | POA: Diagnosis not present

## 2013-09-06 DIAGNOSIS — M6281 Muscle weakness (generalized): Secondary | ICD-10-CM | POA: Diagnosis not present

## 2013-09-08 DIAGNOSIS — M545 Low back pain, unspecified: Secondary | ICD-10-CM | POA: Diagnosis not present

## 2013-09-08 DIAGNOSIS — M6281 Muscle weakness (generalized): Secondary | ICD-10-CM | POA: Diagnosis not present

## 2013-09-13 DIAGNOSIS — M545 Low back pain, unspecified: Secondary | ICD-10-CM | POA: Diagnosis not present

## 2013-09-13 DIAGNOSIS — M6281 Muscle weakness (generalized): Secondary | ICD-10-CM | POA: Diagnosis not present

## 2013-09-15 DIAGNOSIS — M6281 Muscle weakness (generalized): Secondary | ICD-10-CM | POA: Diagnosis not present

## 2013-09-15 DIAGNOSIS — M545 Low back pain, unspecified: Secondary | ICD-10-CM | POA: Diagnosis not present

## 2013-09-20 DIAGNOSIS — M545 Low back pain, unspecified: Secondary | ICD-10-CM | POA: Diagnosis not present

## 2013-09-20 DIAGNOSIS — M6281 Muscle weakness (generalized): Secondary | ICD-10-CM | POA: Diagnosis not present

## 2013-09-21 DIAGNOSIS — M545 Low back pain, unspecified: Secondary | ICD-10-CM | POA: Diagnosis not present

## 2013-09-21 DIAGNOSIS — M6281 Muscle weakness (generalized): Secondary | ICD-10-CM | POA: Diagnosis not present

## 2013-09-25 ENCOUNTER — Other Ambulatory Visit: Payer: Self-pay | Admitting: Internal Medicine

## 2013-09-27 DIAGNOSIS — M545 Low back pain, unspecified: Secondary | ICD-10-CM | POA: Diagnosis not present

## 2013-09-27 DIAGNOSIS — M6281 Muscle weakness (generalized): Secondary | ICD-10-CM | POA: Diagnosis not present

## 2013-09-28 DIAGNOSIS — M545 Low back pain, unspecified: Secondary | ICD-10-CM | POA: Diagnosis not present

## 2013-09-28 DIAGNOSIS — M6281 Muscle weakness (generalized): Secondary | ICD-10-CM | POA: Diagnosis not present

## 2013-10-03 DIAGNOSIS — M6281 Muscle weakness (generalized): Secondary | ICD-10-CM | POA: Diagnosis not present

## 2013-10-03 DIAGNOSIS — M545 Low back pain, unspecified: Secondary | ICD-10-CM | POA: Diagnosis not present

## 2013-10-05 DIAGNOSIS — M6281 Muscle weakness (generalized): Secondary | ICD-10-CM | POA: Diagnosis not present

## 2013-10-05 DIAGNOSIS — M545 Low back pain, unspecified: Secondary | ICD-10-CM | POA: Diagnosis not present

## 2013-10-10 DIAGNOSIS — M545 Low back pain, unspecified: Secondary | ICD-10-CM | POA: Diagnosis not present

## 2013-10-10 DIAGNOSIS — M6281 Muscle weakness (generalized): Secondary | ICD-10-CM | POA: Diagnosis not present

## 2013-10-12 DIAGNOSIS — M545 Low back pain, unspecified: Secondary | ICD-10-CM | POA: Diagnosis not present

## 2013-10-12 DIAGNOSIS — M6281 Muscle weakness (generalized): Secondary | ICD-10-CM | POA: Diagnosis not present

## 2013-10-16 DIAGNOSIS — M47817 Spondylosis without myelopathy or radiculopathy, lumbosacral region: Secondary | ICD-10-CM | POA: Diagnosis not present

## 2013-10-17 DIAGNOSIS — M545 Low back pain, unspecified: Secondary | ICD-10-CM | POA: Diagnosis not present

## 2013-10-17 DIAGNOSIS — M6281 Muscle weakness (generalized): Secondary | ICD-10-CM | POA: Diagnosis not present

## 2013-10-19 DIAGNOSIS — I10 Essential (primary) hypertension: Secondary | ICD-10-CM | POA: Diagnosis not present

## 2013-10-19 DIAGNOSIS — E039 Hypothyroidism, unspecified: Secondary | ICD-10-CM | POA: Diagnosis not present

## 2013-10-19 DIAGNOSIS — M545 Low back pain, unspecified: Secondary | ICD-10-CM | POA: Diagnosis not present

## 2013-10-19 DIAGNOSIS — D62 Acute posthemorrhagic anemia: Secondary | ICD-10-CM | POA: Diagnosis not present

## 2013-10-19 DIAGNOSIS — M6281 Muscle weakness (generalized): Secondary | ICD-10-CM | POA: Diagnosis not present

## 2013-10-19 LAB — HEPATIC FUNCTION PANEL
ALT: 11 U/L (ref 10–40)
AST: 18 U/L (ref 14–40)
Alkaline Phosphatase: 42 U/L (ref 25–125)
Bilirubin, Total: 0.3 mg/dL

## 2013-10-19 LAB — BASIC METABOLIC PANEL
BUN: 24 mg/dL — AB (ref 4–21)
Creatinine: 1.1 mg/dL (ref 0.6–1.3)
Glucose: 94 mg/dL
Potassium: 4.6 mmol/L (ref 3.4–5.3)
Sodium: 138 mmol/L (ref 137–147)

## 2013-10-19 LAB — TSH: TSH: 0.86 u[IU]/mL (ref 0.41–5.90)

## 2013-10-19 LAB — CBC AND DIFFERENTIAL
HCT: 35 % — AB (ref 41–53)
Hemoglobin: 11.9 g/dL — AB (ref 13.5–17.5)
Platelets: 243 10*3/uL (ref 150–399)
WBC: 7.3 10^3/mL

## 2013-10-24 DIAGNOSIS — M545 Low back pain, unspecified: Secondary | ICD-10-CM | POA: Diagnosis not present

## 2013-10-24 DIAGNOSIS — M6281 Muscle weakness (generalized): Secondary | ICD-10-CM | POA: Diagnosis not present

## 2013-10-25 DIAGNOSIS — M810 Age-related osteoporosis without current pathological fracture: Secondary | ICD-10-CM | POA: Diagnosis not present

## 2013-10-25 DIAGNOSIS — E559 Vitamin D deficiency, unspecified: Secondary | ICD-10-CM | POA: Diagnosis not present

## 2013-10-25 DIAGNOSIS — R5381 Other malaise: Secondary | ICD-10-CM | POA: Diagnosis not present

## 2013-10-26 ENCOUNTER — Non-Acute Institutional Stay: Payer: Medicare Other | Admitting: Nurse Practitioner

## 2013-10-26 VITALS — BP 132/62 | HR 74 | Temp 96.8°F | Ht 65.5 in | Wt 133.0 lb

## 2013-10-26 DIAGNOSIS — E039 Hypothyroidism, unspecified: Secondary | ICD-10-CM | POA: Diagnosis not present

## 2013-10-26 DIAGNOSIS — M6281 Muscle weakness (generalized): Secondary | ICD-10-CM | POA: Diagnosis not present

## 2013-10-26 DIAGNOSIS — N400 Enlarged prostate without lower urinary tract symptoms: Secondary | ICD-10-CM | POA: Diagnosis not present

## 2013-10-26 DIAGNOSIS — I10 Essential (primary) hypertension: Secondary | ICD-10-CM | POA: Diagnosis not present

## 2013-10-26 DIAGNOSIS — R609 Edema, unspecified: Secondary | ICD-10-CM

## 2013-10-26 DIAGNOSIS — M545 Low back pain, unspecified: Secondary | ICD-10-CM | POA: Diagnosis not present

## 2013-10-26 NOTE — Progress Notes (Signed)
Patient ID: Cory Burton, male   DOB: 1918/01/10, 80 y.o.   MRN: [id number]    Code Status: Living Will  Allergies  Allergen Reactions  . Horse-Derived Products     Horse serum    Chief Complaint  Patient presents with  . Medical Managment of Chronic Issues    Comprehensive exam: blood pressure, thyroid    HPI: Patient is a 80 y.o. male seen in the clinic at [facility] today for evaluation of thyroid, HTN, edema, BPH, and other chronic medical conditions.  Problem List Items Addressed This Visit   Unspecified hypothyroidism     Takes Levothyroxine 171mcg daily, TSH 0.864 10/19/13    HTN (hypertension)     Controlled on Losartan 25mg , Bun/creat 24/1.06 10/19/13    Relevant Medications      aspirin 81 MG tablet   Edema - Primary     race left pedal edema noted-not apparent. Takes Furosemide 10mg  daily. Chronic since the left hip surgery 12/2011. Will reduce Furosemide to 10mg  po qod. Observe for relapse of edema.     BPH (benign prostatic hyperplasia)     No urinary retention, takes Tamsulosin and Finasteride.         Review of Systems:  Review of Systems  Constitutional: Negative for fever, chills, weight loss, malaise/fatigue and diaphoresis.  HENT: Positive for hearing loss. Negative for congestion, ear discharge, ear pain, nosebleeds and tinnitus.   Eyes: Negative for blurred vision, double vision, photophobia, pain, discharge and redness.  Respiratory: Negative for cough, hemoptysis, sputum production, shortness of breath, wheezing and stridor.   Cardiovascular: Positive for leg swelling. Negative for chest pain, orthopnea, claudication and PND.       Trace left pedal edema since the left hip fx surgical repair 12/2011-resolved.   Gastrointestinal: Negative for heartburn, nausea, vomiting, abdominal pain, diarrhea, constipation, blood in stool and melena.       FOBT negative. External hemorrhoids.   Genitourinary: Positive for frequency. Negative for dysuria,  urgency, hematuria and flank pain.  Musculoskeletal: Negative for back pain, falls, joint pain, myalgias and neck pain.  Skin: Negative for itching and rash.  Neurological: Negative for dizziness, tingling, tremors, sensory change, speech change, focal weakness, seizures, loss of consciousness, weakness and headaches.  Endo/Heme/Allergies: Negative for environmental allergies and polydipsia. Does not bruise/bleed easily.  Psychiatric/Behavioral: Negative for depression, suicidal ideas, hallucinations, memory loss and substance abuse. The patient is not nervous/anxious and does not have insomnia.    Past Medical History  Diagnosis Date  . Thyroid disease   . Hypertension   . BPH (benign prostatic hyperplasia)   . Fracture, intertrochanteric, left femur 12/17/2011  . Senile osteoporosis 08/25/2012  . Edema 04/28/2012  . Unspecified hearing loss 12/24/2011  . Cardiomegaly 12/24/2011  . Degeneration of thoracic or thoracolumbar intervertebral disc 12/24/2011  . Spinal stenosis, lumbar region, without neurogenic claudication 12/24/2011  . Other abnormal blood chemistry 12/24/2011  . Unspecified hypothyroidism 12/23/2011  . Acute posthemorrhagic anemia 12/23/2011   Past Surgical History  Procedure Laterality Date  . Foot surgery    . Compression hip screw  12/17/2011    Procedure: COMPRESSION HIP;  Surgeon: Johnny Bridge, MD;  Location: [facility];  Service: Orthopedics;  Laterality: Left;   Social History:   reports that he quit smoking about 28 years ago. His smoking use included Pipe. He has never used smokeless tobacco. He reports that he drinks about 0.6 ounces of alcohol per week. He reports that he does not use illicit  drugs.   Medications: Patient's Medications  New Prescriptions   No medications on file  Previous Medications   ASPIRIN 81 MG TABLET    Take 81 mg by mouth daily.   B COMPLEX VITAMINS (VITAMIN B COMPLEX PO)    Take by mouth. Take one tablet daily   CALCIUM-VITAMIN D (OSCAL WITH D)  500-200 MG-UNIT PER TABLET    Take 1 tablet by mouth daily.   DENOSUMAB (PROLIA) 60 MG/ML SOLN INJECTION    Inject 60 mg into the skin every 6 (six) months. Administer in upper arm, thigh, or abdomen   FINASTERIDE (PROSCAR) 5 MG TABLET    Take 5 mg by mouth daily.   FUROSEMIDE (LASIX) 20 MG TABLET    Take 20 mg by mouth. Take 1/2 tablet every other day for edema   LEVOTHYROXINE (SYNTHROID, LEVOTHROID) 137 MCG TABLET    Take 1 tablet (137 mcg total) by mouth daily.   LOSARTAN (COZAAR) 50 MG TABLET    TAKE 1/2 TABLET DAILY TO   STRENGTHEN THE HEART AND   CONTROL BLOOD PRESSURE   MULTIPLE VITAMIN (MULTIVITAMIN) TABLET    Take 1 tablet by mouth daily.   OMEGA-3 300 MG CAPS    Take by mouth. Take one daily   RESVERATROL 100 MG CAPS    Take by mouth. Take one daily   TAMSULOSIN HCL (FLOMAX) 0.4 MG CAPS    Take 0.4 mg by mouth daily.   TRIAMCINOLONE CREAM (KENALOG) 0.1 %    Apply to  Lower legs twice daily   VITAMIN C (ASCORBIC ACID) 500 MG TABLET    Take 500 mg by mouth daily.   VITAMIN E (VITAMIN E) 400 UNIT CAPSULE    Take 400 Units by mouth daily.  Modified Medications   No medications on file  Discontinued Medications   No medications on file   Physical Exam: Physical Exam  Constitutional: He is oriented to person, place, and time. He appears well-developed and well-nourished. No distress.  HENT:  Head: Normocephalic and atraumatic.  Right Ear: External ear normal.  Left Ear: External ear normal.  Nose: Nose normal.  Mouth/Throat: Oropharynx is clear and moist. No oropharyngeal exudate.  Eyes: Conjunctivae and EOM are normal. Pupils are equal, round, and reactive to light. Right eye exhibits no discharge. Left eye exhibits no discharge. No scleral icterus.  Neck: Normal range of motion. Neck supple. No JVD present. No tracheal deviation present. No thyromegaly present.  Cardiovascular: Normal rate, regular rhythm, normal heart sounds and intact distal pulses.   No murmur  heard. Pulmonary/Chest: Effort normal and breath sounds normal. No stridor. No respiratory distress. He has no wheezes. He has no rales. He exhibits no tenderness.  Abdominal: Soft. Bowel sounds are normal. He exhibits no distension. There is no tenderness. There is no rebound and no guarding.  External hemorrhoids.   Genitourinary: Guaiac negative stool.  Musculoskeletal: Normal range of motion. He exhibits edema. He exhibits no tenderness.  Trace left pedal edema since the left hip surgery 12/2011.   Lymphadenopathy:    He has no cervical adenopathy.  Neurological: He is alert and oriented to person, place, and time. He has normal reflexes. No cranial nerve deficit. He exhibits normal muscle tone. Coordination normal.  Skin: Skin is warm and dry. No rash noted. He is not diaphoretic. No erythema. No pallor.  Psychiatric: He has a normal mood and affect. His behavior is normal. Judgment and thought content normal.    Filed Vitals:  10/26/13 1635  BP: 132/62  Pulse: 74  Temp: 96.8 F (36 C)  TempSrc: Oral  Height: 5' 5.5" (1.664 m)  Weight: 133 lb (60.328 kg)   Labs reviewed: Basic Metabolic Panel:  Recent Labs  04/18/13 10/19/13  NA 138 138  K 4.7 4.6  BUN 16 24*  CREATININE 1.1 1.1  TSH 0.63 0.86   Assessment/Plan Edema race left pedal edema noted-not apparent. Takes Furosemide 10mg  daily. Chronic since the left hip surgery 12/2011. Will reduce Furosemide to 10mg  po qod. Observe for relapse of edema.   BPH (benign prostatic hyperplasia) No urinary retention, takes Tamsulosin and Finasteride.    Unspecified hypothyroidism Takes Levothyroxine 141mcg daily, TSH 0.864 10/19/13  HTN (hypertension) Controlled on Losartan 25mg , Bun/creat 24/1.06 10/19/13    Family/ Staff Communication: observe the patient.   Goals of Care: [place]  Labs/tests ordered: none

## 2013-10-29 NOTE — Assessment & Plan Note (Signed)
Takes Levothyroxine 18mcg daily, TSH 0.864 10/19/13

## 2013-10-29 NOTE — Assessment & Plan Note (Signed)
No urinary retention, takes Tamsulosin and Finasteride.

## 2013-10-29 NOTE — Assessment & Plan Note (Signed)
Controlled on Losartan 25mg , Bun/creat 24/1.06 10/19/13

## 2013-10-29 NOTE — Assessment & Plan Note (Signed)
race left pedal edema noted-not apparent. Takes Furosemide 10mg  daily. Chronic since the left hip surgery 12/2011. Will reduce Furosemide to 10mg  po qod. Observe for relapse of edema.

## 2013-10-31 DIAGNOSIS — M545 Low back pain, unspecified: Secondary | ICD-10-CM | POA: Diagnosis not present

## 2013-10-31 DIAGNOSIS — M6281 Muscle weakness (generalized): Secondary | ICD-10-CM | POA: Diagnosis not present

## 2013-11-03 DIAGNOSIS — M810 Age-related osteoporosis without current pathological fracture: Secondary | ICD-10-CM | POA: Diagnosis not present

## 2013-11-03 DIAGNOSIS — M48061 Spinal stenosis, lumbar region without neurogenic claudication: Secondary | ICD-10-CM | POA: Diagnosis not present

## 2013-11-07 DIAGNOSIS — M545 Low back pain, unspecified: Secondary | ICD-10-CM | POA: Diagnosis not present

## 2013-11-07 DIAGNOSIS — M6281 Muscle weakness (generalized): Secondary | ICD-10-CM | POA: Diagnosis not present

## 2013-11-23 ENCOUNTER — Encounter: Payer: Self-pay | Admitting: Internal Medicine

## 2014-01-30 DIAGNOSIS — C4492 Squamous cell carcinoma of skin, unspecified: Secondary | ICD-10-CM | POA: Diagnosis not present

## 2014-01-30 DIAGNOSIS — L570 Actinic keratosis: Secondary | ICD-10-CM | POA: Diagnosis not present

## 2014-01-30 DIAGNOSIS — L299 Pruritus, unspecified: Secondary | ICD-10-CM | POA: Diagnosis not present

## 2014-03-21 IMAGING — RF DG HIP OPERATIVE*L*
1 series · 4 of 4 positions shown · non-contrast
Comparison: Four digital C-arm fluoroscopic images obtained
intraoperatively are compared to the preceding study of 12/17/2011

CLINICAL DATA: Left hip fracture

OPERATIVE LEFT HIP

[Series 1: run · 4 of 4 slices shown]
[im 1/4]
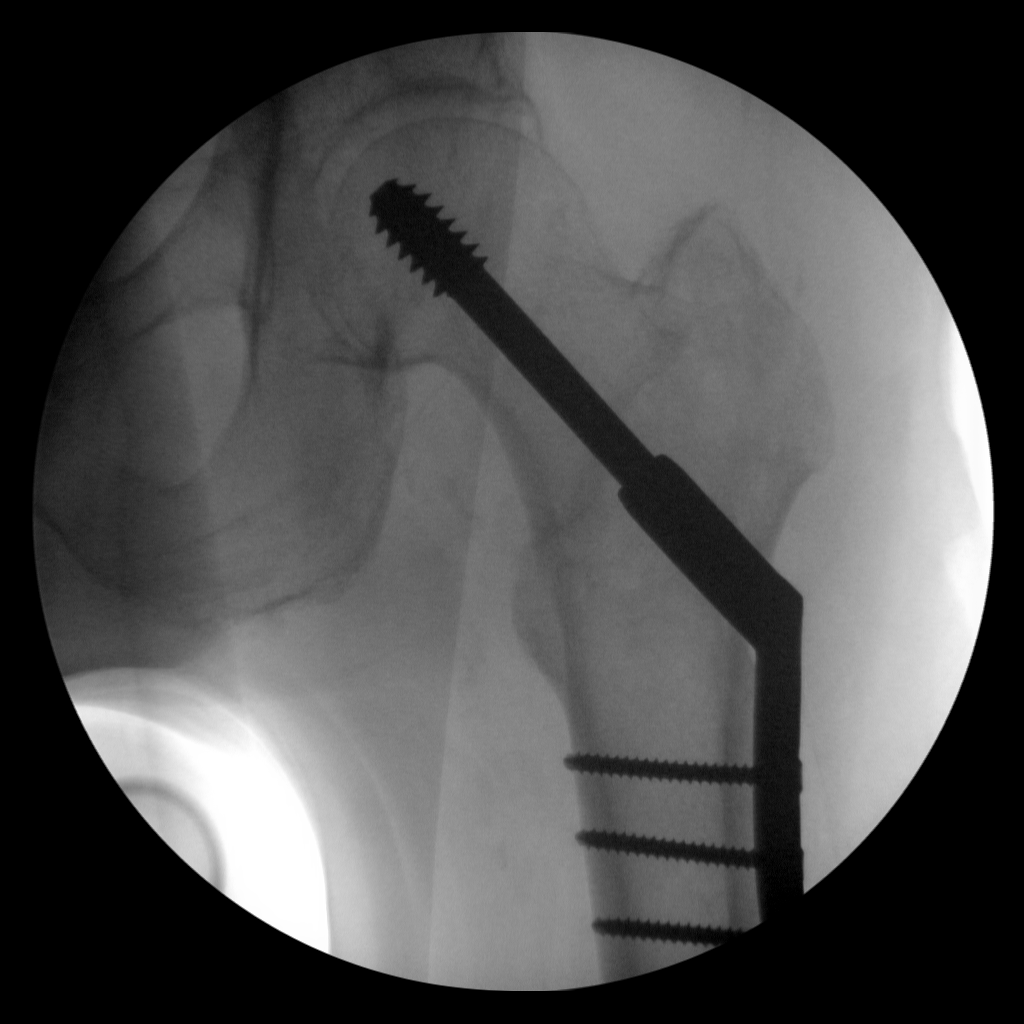
[im 2/4]
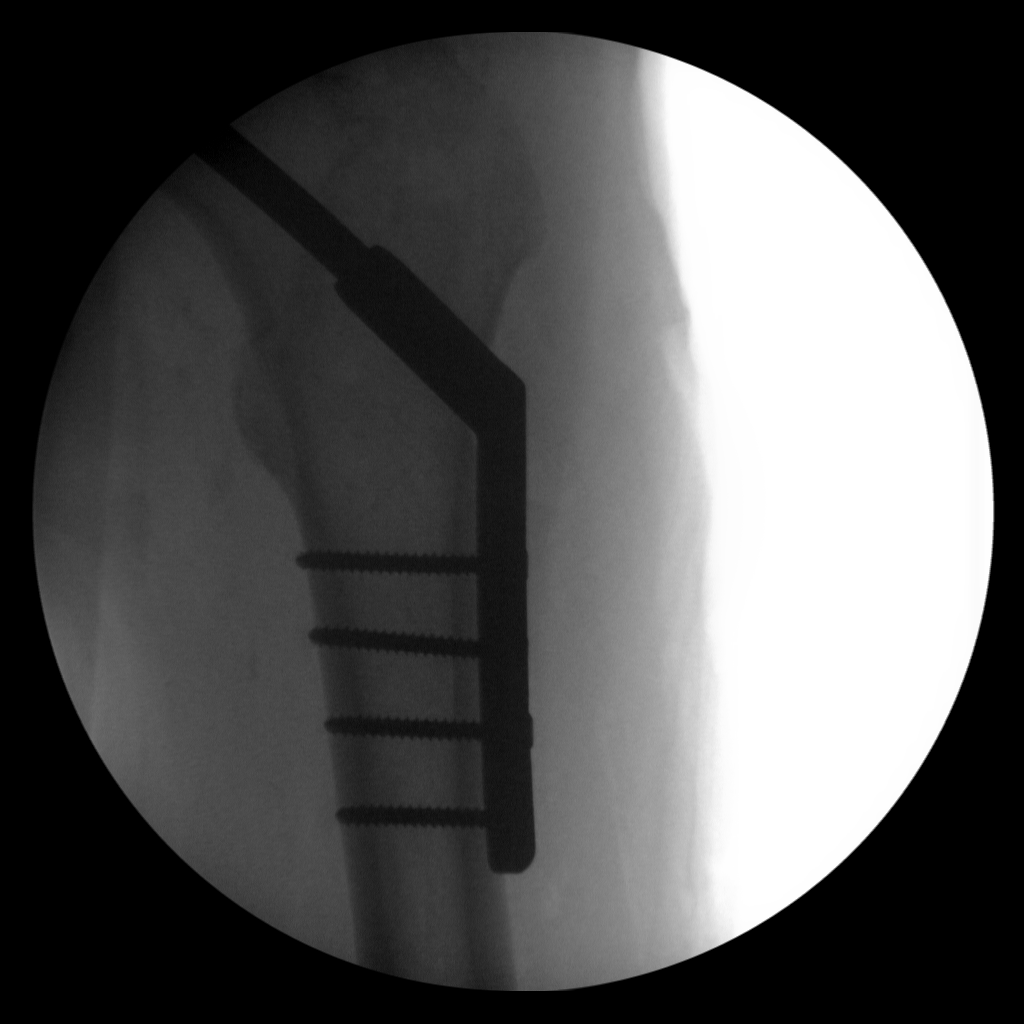
[im 3/4]
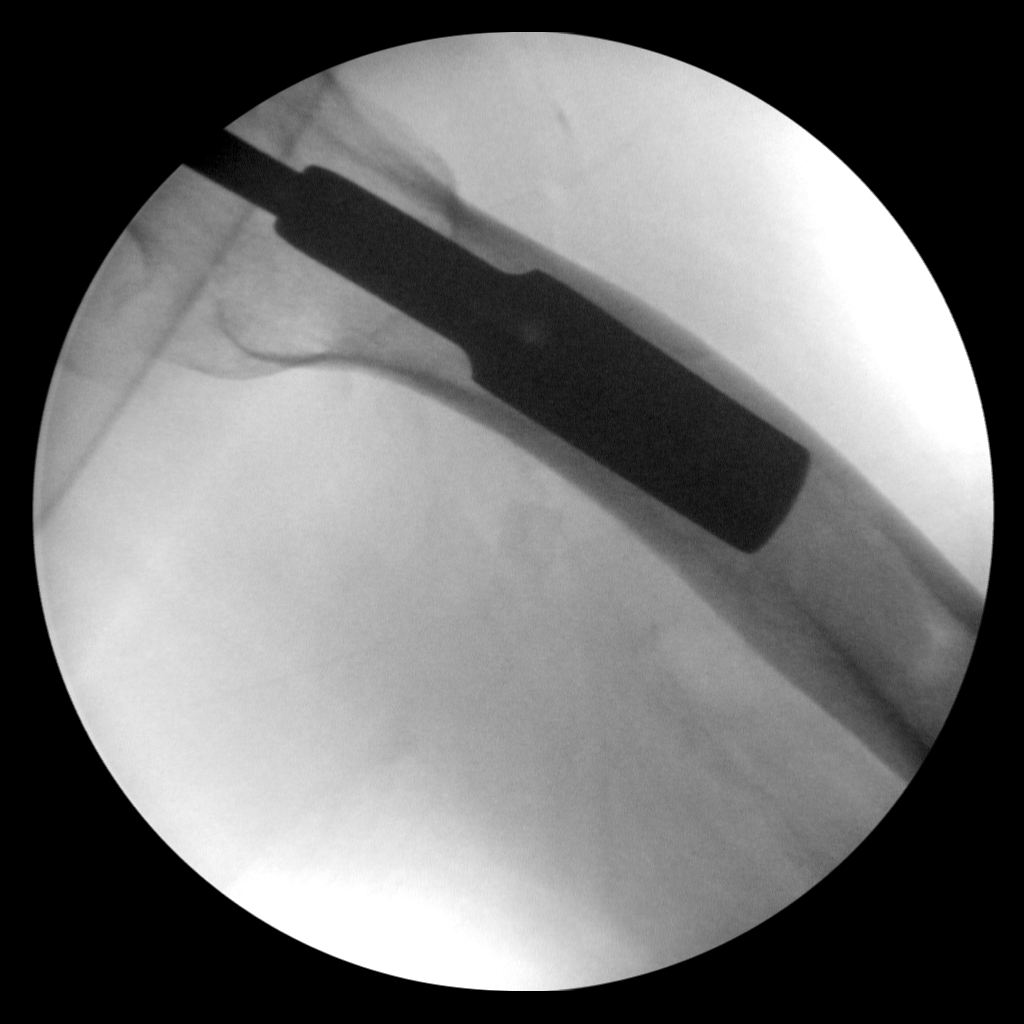
[im 4/4]
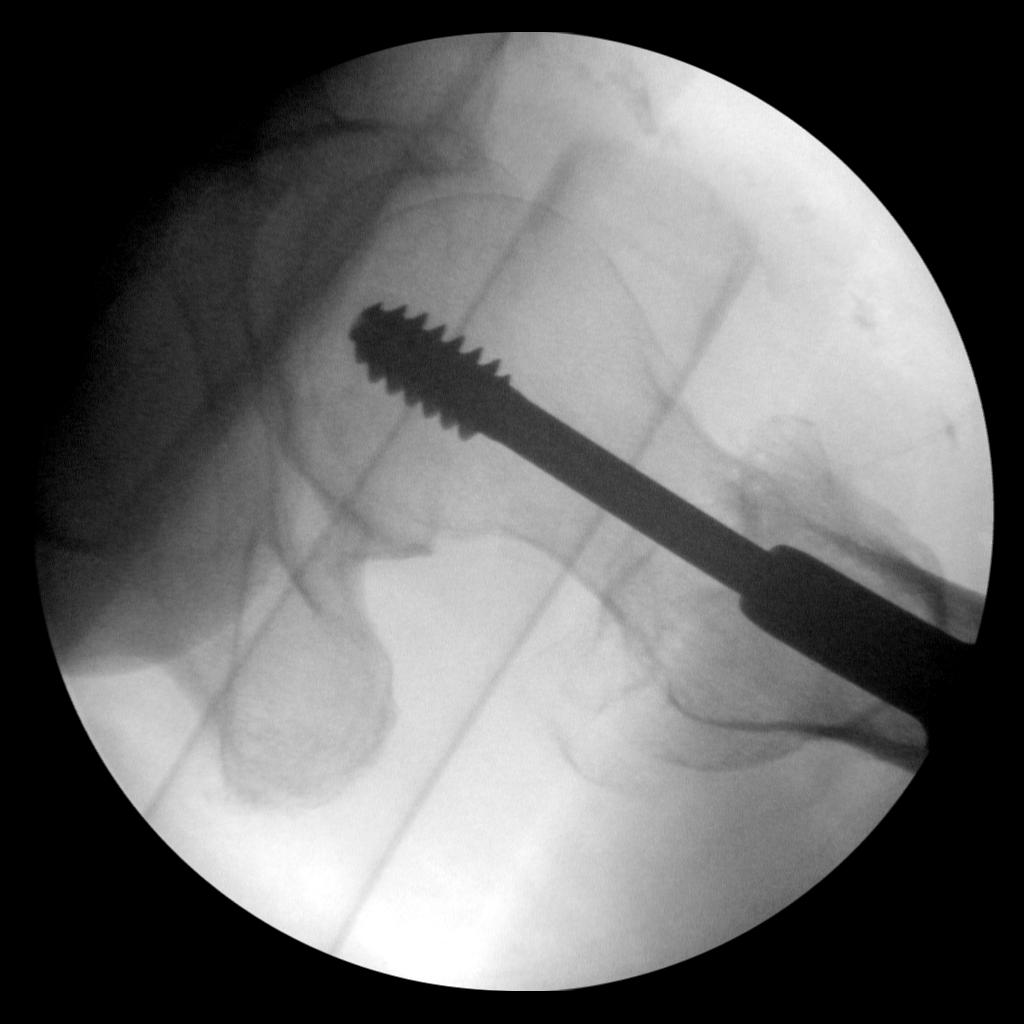

[4 of 4 positions shown; findings below may reference images not displayed]

FINDINGS: 38 seconds of fluoroscopy time utilized.
Images demonstrate placement of a plate and compression screw
across a reduced intertrochanteric fracture of the left femur.
No dislocation or additional focal bony abnormalities identified.
IMPRESSION: Post ORIF of reduced intertrochanteric fracture left femur.

## 2014-04-17 DIAGNOSIS — I10 Essential (primary) hypertension: Secondary | ICD-10-CM | POA: Diagnosis not present

## 2014-04-17 LAB — BASIC METABOLIC PANEL
BUN: 14 mg/dL (ref 4–21)
Creatinine: 1 mg/dL (ref 0.6–1.3)
Glucose: 90 mg/dL
Potassium: 4.2 mmol/L (ref 3.4–5.3)
Sodium: 139 mmol/L (ref 137–147)

## 2014-04-26 ENCOUNTER — Encounter: Payer: Self-pay | Admitting: Nurse Practitioner

## 2014-04-26 ENCOUNTER — Non-Acute Institutional Stay: Payer: Medicare Other | Admitting: Nurse Practitioner

## 2014-04-26 VITALS — BP 120/62 | HR 88 | Wt 138.0 lb

## 2014-04-26 DIAGNOSIS — M5430 Sciatica, unspecified side: Secondary | ICD-10-CM

## 2014-04-26 DIAGNOSIS — I10 Essential (primary) hypertension: Secondary | ICD-10-CM

## 2014-04-26 DIAGNOSIS — M5431 Sciatica, right side: Secondary | ICD-10-CM

## 2014-04-26 DIAGNOSIS — E039 Hypothyroidism, unspecified: Secondary | ICD-10-CM

## 2014-04-26 DIAGNOSIS — N400 Enlarged prostate without lower urinary tract symptoms: Secondary | ICD-10-CM | POA: Diagnosis not present

## 2014-04-26 DIAGNOSIS — R609 Edema, unspecified: Secondary | ICD-10-CM | POA: Diagnosis not present

## 2014-04-26 NOTE — Assessment & Plan Note (Signed)
trace left pedal edema noted-not apparent. Takes Furosemide 10mg  daily. Chronic since the left hip surgery 12/2011. Continue  Furosemide 10mg  po qod. Observe for relapse of edema. F/u CMP prior to next appointment 10/2014

## 2014-04-26 NOTE — Assessment & Plan Note (Signed)
No urinary retention, takes Tamsulosin and Finasteride. 1-2x/night bathroom trips.

## 2014-04-26 NOTE — Assessment & Plan Note (Signed)
Stated started about 10 months ago. Saw Ortho. Had spinal inj. PT helped. Feels sensitive RLE but no pain as long as he exercises regularly.

## 2014-04-26 NOTE — Assessment & Plan Note (Addendum)
Takes Levothyroxine 1107mcg daily, TSH 0.864 10/19/13. Update TSH and CBC prior to next appointment 10/2014

## 2014-04-26 NOTE — Assessment & Plan Note (Signed)
Controlled on Losartan 25mg, Bun/creat 24/1.06 10/19/13 and 14/1.0 04/17/14  

## 2014-04-26 NOTE — Progress Notes (Signed)
Patient ID: Cory Burton, male   DOB: [birth year], 80 y.o.   MRN: [id number]    Code Status: Living Will  Allergies  Allergen Reactions  . Horse-Derived Products     Horse serum    Chief Complaint  Patient presents with  . Medical Management of Chronic Issues    blood pressure, thyroid, edema    HPI: Patient is a 80 y.o. male seen in the clinic at [facility] today for evaluation of thyroid, HTN, edema, BPH, and other chronic medical conditions.  Problem List Items Addressed This Visit   HTN (hypertension) - Primary     Controlled on Losartan 25mg , Bun/creat 24/1.06 10/19/13 and 14/1.0 04/17/14     Edema     trace left pedal edema noted-not apparent. Takes Furosemide 10mg  daily. Chronic since the left hip surgery 12/2011. Continue  Furosemide 10mg  po qod. Observe for relapse of edema. F/u CMP prior to next appointment 10/2014     BPH (benign prostatic hyperplasia)     No urinary retention, takes Tamsulosin and Finasteride. 1-2x/night bathroom trips.       Unspecified hypothyroidism     Takes Levothyroxine 169mcg daily, TSH 0.864 10/19/13. Update TSH and CBC prior to next appointment 10/2014     Sciatica of right side     Stated started about 10 months ago. Saw Ortho. Had spinal inj. PT helped. Feels sensitive RLE but no pain as long as he exercises regularly.        Review of Systems:  Review of Systems  Constitutional: Negative for fever, chills, weight loss, malaise/fatigue and diaphoresis.  HENT: Positive for hearing loss. Negative for congestion, ear discharge, ear pain, nosebleeds and tinnitus.   Eyes: Negative for blurred vision, double vision, photophobia, pain, discharge and redness.  Respiratory: Negative for cough, hemoptysis, sputum production, shortness of breath, wheezing and stridor.   Cardiovascular: Positive for leg swelling. Negative for chest pain, orthopnea, claudication and PND.       Trace left pedal edema since the left hip fx surgical repair  12/2011-resolved.   Gastrointestinal: Negative for heartburn, nausea, vomiting, abdominal pain, diarrhea, constipation, blood in stool and melena.       FOBT negative. External hemorrhoids.   Genitourinary: Positive for frequency. Negative for dysuria, urgency, hematuria and flank pain.  Musculoskeletal: Positive for back pain. Negative for falls, joint pain, myalgias and neck pain.       R sciatica nerve pain.   Skin: Negative for itching and rash.  Neurological: Negative for dizziness, tingling, tremors, sensory change, speech change, focal weakness, seizures, loss of consciousness, weakness and headaches.  Endo/Heme/Allergies: Negative for environmental allergies and polydipsia. Bruises/bleeds easily.       Limbs  Psychiatric/Behavioral: Negative for depression, suicidal ideas, hallucinations, memory loss and substance abuse. The patient is not nervous/anxious and does not have insomnia.    Past Medical History  Diagnosis Date  . Thyroid disease   . Hypertension   . BPH (benign prostatic hyperplasia)   . Fracture, intertrochanteric, left femur 12/17/2011  . Senile osteoporosis 08/25/2012  . Edema 04/28/2012  . Unspecified hearing loss 12/24/2011  . Cardiomegaly 12/24/2011  . Degeneration of thoracic or thoracolumbar intervertebral disc 12/24/2011  . Spinal stenosis, lumbar region, without neurogenic claudication 12/24/2011  . Other abnormal blood chemistry 12/24/2011  . Unspecified hypothyroidism 12/23/2011  . Acute posthemorrhagic anemia 12/23/2011   Past Surgical History  Procedure Laterality Date  . Foot surgery    . Compression hip screw  12/17/2011  Procedure: COMPRESSION HIP;  Surgeon: Johnny Bridge, MD;  Location: [facility];  Service: Orthopedics;  Laterality: Left;   Social History:   reports that he quit smoking about 29 years ago. His smoking use included Pipe. He has never used smokeless tobacco. He reports that he drinks about .6 ounces of alcohol per week. He reports that he does not  use illicit drugs.   Medications: Patient's Medications  New Prescriptions   No medications on file  Previous Medications   ASPIRIN 81 MG TABLET    Take 81 mg by mouth daily.   B COMPLEX VITAMINS (VITAMIN B COMPLEX PO)    Take by mouth. Take one tablet daily   CALCIUM-VITAMIN D (OSCAL WITH D) 500-200 MG-UNIT PER TABLET    Take 1 tablet by mouth daily.   DENOSUMAB (PROLIA) 60 MG/ML SOLN INJECTION    Inject 60 mg into the skin every 6 (six) months. Administer in upper arm, thigh, or abdomen   FINASTERIDE (PROSCAR) 5 MG TABLET    Take 5 mg by mouth daily.   FUROSEMIDE (LASIX) 20 MG TABLET    Take 20 mg by mouth. Take 1/2 tablet every other day for edema   LEVOTHYROXINE (SYNTHROID, LEVOTHROID) 137 MCG TABLET    Take 1 tablet (137 mcg total) by mouth daily.   LOSARTAN (COZAAR) 50 MG TABLET    TAKE 1/2 TABLET DAILY TO   STRENGTHEN THE HEART AND   CONTROL BLOOD PRESSURE   MULTIPLE VITAMIN (MULTIVITAMIN) TABLET    Take 1 tablet by mouth daily.   OMEGA-3 300 MG CAPS    Take by mouth. Take one daily   RESVERATROL 100 MG CAPS    Take by mouth. Take one daily   TAMSULOSIN HCL (FLOMAX) 0.4 MG CAPS    Take 0.4 mg by mouth daily.   TRIAMCINOLONE CREAM (KENALOG) 0.1 %    Apply to  Lower legs twice daily   VITAMIN C (ASCORBIC ACID) 500 MG TABLET    Take 500 mg by mouth daily.   VITAMIN E (VITAMIN E) 400 UNIT CAPSULE    Take 400 Units by mouth daily.  Modified Medications   No medications on file  Discontinued Medications   No medications on file   Physical Exam: Physical Exam  Constitutional: He is oriented to person, place, and time. He appears well-developed and well-nourished. No distress.  HENT:  Head: Normocephalic and atraumatic.  Right Ear: External ear normal.  Left Ear: External ear normal.  Nose: Nose normal.  Mouth/Throat: Oropharynx is clear and moist. No oropharyngeal exudate.  Eyes: Conjunctivae and EOM are normal. Pupils are equal, round, and reactive to light. Right eye exhibits  no discharge. Left eye exhibits no discharge. No scleral icterus.  Neck: Normal range of motion. Neck supple. No JVD present. No tracheal deviation present. No thyromegaly present.  Cardiovascular: Normal rate, regular rhythm, normal heart sounds and intact distal pulses.   No murmur heard. Pulmonary/Chest: Effort normal and breath sounds normal. No stridor. No respiratory distress. He has no wheezes. He has no rales. He exhibits no tenderness.  Abdominal: Soft. Bowel sounds are normal. He exhibits no distension. There is no tenderness. There is no rebound and no guarding.  External hemorrhoids.   Genitourinary: Guaiac negative stool.  Musculoskeletal: Normal range of motion. He exhibits edema. He exhibits no tenderness.  Trace left pedal edema since the left hip surgery 12/2011.   Lymphadenopathy:    He has no cervical adenopathy.  Neurological: He is alert  and oriented to person, place, and time. He has normal reflexes. No cranial nerve deficit. He exhibits normal muscle tone. Coordination normal.  Skin: Skin is warm and dry. No rash noted. He is not diaphoretic. No erythema. No pallor.  Ecchymoses limbs  Psychiatric: He has a normal mood and affect. His behavior is normal. Judgment and thought content normal.    Filed Vitals:   04/26/14 1348  BP: 120/62  Pulse: 88  Weight: 138 lb (62.596 kg)   Labs reviewed: Basic Metabolic Panel:  Recent Labs  10/19/13 04/17/14  NA 138 139  K 4.6 4.2  BUN 24* 14  CREATININE 1.1 1.0  TSH 0.86  --    Assessment/Plan HTN (hypertension) Controlled on Losartan 25mg , Bun/creat 24/1.06 10/19/13 and 14/1.0 04/17/14   BPH (benign prostatic hyperplasia) No urinary retention, takes Tamsulosin and Finasteride. 1-2x/night bathroom trips.     Edema trace left pedal edema noted-not apparent. Takes Furosemide 10mg  daily. Chronic since the left hip surgery 12/2011. Continue  Furosemide 10mg  po qod. Observe for relapse of edema. F/u CMP prior to next  appointment 10/2014   Unspecified hypothyroidism Takes Levothyroxine 166mcg daily, TSH 0.864 10/19/13. Update TSH and CBC prior to next appointment 10/2014   Sciatica of right side Stated started about 10 months ago. Saw Ortho. Had spinal inj. PT helped. Feels sensitive RLE but no pain as long as he exercises regularly.     Family/ Staff Communication: observe the patient.    Goals of Care: [place] f/u appointment 10/2013  Labs/tests ordered: CBC, CMP, TSH prior to next appointment.

## 2014-05-01 DIAGNOSIS — R5381 Other malaise: Secondary | ICD-10-CM | POA: Diagnosis not present

## 2014-05-01 DIAGNOSIS — M810 Age-related osteoporosis without current pathological fracture: Secondary | ICD-10-CM | POA: Diagnosis not present

## 2014-05-01 DIAGNOSIS — E559 Vitamin D deficiency, unspecified: Secondary | ICD-10-CM | POA: Diagnosis not present

## 2014-05-09 DIAGNOSIS — E559 Vitamin D deficiency, unspecified: Secondary | ICD-10-CM | POA: Diagnosis not present

## 2014-05-09 DIAGNOSIS — M810 Age-related osteoporosis without current pathological fracture: Secondary | ICD-10-CM | POA: Diagnosis not present

## 2014-05-14 DIAGNOSIS — Z13820 Encounter for screening for osteoporosis: Secondary | ICD-10-CM | POA: Diagnosis not present

## 2014-05-14 LAB — HM DEXA SCAN

## 2014-06-08 DIAGNOSIS — Z23 Encounter for immunization: Secondary | ICD-10-CM | POA: Diagnosis not present

## 2014-06-26 DIAGNOSIS — D692 Other nonthrombocytopenic purpura: Secondary | ICD-10-CM | POA: Diagnosis not present

## 2014-06-26 DIAGNOSIS — L300 Nummular dermatitis: Secondary | ICD-10-CM | POA: Diagnosis not present

## 2014-06-26 LAB — CBC AND DIFFERENTIAL
HCT: 34 % — AB (ref 41–53)
Hemoglobin: 11.7 g/dL — AB (ref 13.5–17.5)
Platelets: 225 10*3/uL (ref 150–399)
WBC: 5.7 10^3/mL

## 2014-06-27 ENCOUNTER — Encounter: Payer: Self-pay | Admitting: *Deleted

## 2014-07-02 ENCOUNTER — Other Ambulatory Visit: Payer: Self-pay | Admitting: *Deleted

## 2014-07-02 MED ORDER — LEVOTHYROXINE SODIUM 137 MCG PO TABS: ORAL_TABLET | ORAL | Status: DC

## 2014-07-02 NOTE — Telephone Encounter (Signed)
Patient called and requested to be faxed to [facility].

## 2014-07-03 DIAGNOSIS — M4806 Spinal stenosis, lumbar region: Secondary | ICD-10-CM | POA: Diagnosis not present

## 2014-07-09 DIAGNOSIS — M545 Low back pain: Secondary | ICD-10-CM | POA: Diagnosis not present

## 2014-07-09 DIAGNOSIS — M79604 Pain in right leg: Secondary | ICD-10-CM | POA: Diagnosis not present

## 2014-07-09 DIAGNOSIS — M6281 Muscle weakness (generalized): Secondary | ICD-10-CM | POA: Diagnosis not present

## 2014-07-16 DIAGNOSIS — M6281 Muscle weakness (generalized): Secondary | ICD-10-CM | POA: Diagnosis not present

## 2014-07-16 DIAGNOSIS — M79604 Pain in right leg: Secondary | ICD-10-CM | POA: Diagnosis not present

## 2014-07-16 DIAGNOSIS — M545 Low back pain: Secondary | ICD-10-CM | POA: Diagnosis not present

## 2014-07-18 DIAGNOSIS — M6281 Muscle weakness (generalized): Secondary | ICD-10-CM | POA: Diagnosis not present

## 2014-07-18 DIAGNOSIS — M79604 Pain in right leg: Secondary | ICD-10-CM | POA: Diagnosis not present

## 2014-07-18 DIAGNOSIS — M545 Low back pain: Secondary | ICD-10-CM | POA: Diagnosis not present

## 2014-07-19 DIAGNOSIS — M545 Low back pain: Secondary | ICD-10-CM | POA: Diagnosis not present

## 2014-07-19 DIAGNOSIS — M79604 Pain in right leg: Secondary | ICD-10-CM | POA: Diagnosis not present

## 2014-07-19 DIAGNOSIS — M6281 Muscle weakness (generalized): Secondary | ICD-10-CM | POA: Diagnosis not present

## 2014-07-23 DIAGNOSIS — M545 Low back pain: Secondary | ICD-10-CM | POA: Diagnosis not present

## 2014-07-23 DIAGNOSIS — M79604 Pain in right leg: Secondary | ICD-10-CM | POA: Diagnosis not present

## 2014-07-23 DIAGNOSIS — M6281 Muscle weakness (generalized): Secondary | ICD-10-CM | POA: Diagnosis not present

## 2014-07-24 DIAGNOSIS — M545 Low back pain: Secondary | ICD-10-CM | POA: Diagnosis not present

## 2014-07-24 DIAGNOSIS — M79604 Pain in right leg: Secondary | ICD-10-CM | POA: Diagnosis not present

## 2014-07-24 DIAGNOSIS — M6281 Muscle weakness (generalized): Secondary | ICD-10-CM | POA: Diagnosis not present

## 2014-07-26 DIAGNOSIS — M545 Low back pain: Secondary | ICD-10-CM | POA: Diagnosis not present

## 2014-07-26 DIAGNOSIS — M79604 Pain in right leg: Secondary | ICD-10-CM | POA: Diagnosis not present

## 2014-07-26 DIAGNOSIS — M6281 Muscle weakness (generalized): Secondary | ICD-10-CM | POA: Diagnosis not present

## 2014-07-31 DIAGNOSIS — M545 Low back pain: Secondary | ICD-10-CM | POA: Diagnosis not present

## 2014-07-31 DIAGNOSIS — M6281 Muscle weakness (generalized): Secondary | ICD-10-CM | POA: Diagnosis not present

## 2014-07-31 DIAGNOSIS — M79604 Pain in right leg: Secondary | ICD-10-CM | POA: Diagnosis not present

## 2014-08-01 DIAGNOSIS — M6281 Muscle weakness (generalized): Secondary | ICD-10-CM | POA: Diagnosis not present

## 2014-08-01 DIAGNOSIS — M545 Low back pain: Secondary | ICD-10-CM | POA: Diagnosis not present

## 2014-08-01 DIAGNOSIS — M79604 Pain in right leg: Secondary | ICD-10-CM | POA: Diagnosis not present

## 2014-08-03 DIAGNOSIS — M545 Low back pain: Secondary | ICD-10-CM | POA: Diagnosis not present

## 2014-08-03 DIAGNOSIS — M6281 Muscle weakness (generalized): Secondary | ICD-10-CM | POA: Diagnosis not present

## 2014-08-03 DIAGNOSIS — M79604 Pain in right leg: Secondary | ICD-10-CM | POA: Diagnosis not present

## 2014-08-06 DIAGNOSIS — M6281 Muscle weakness (generalized): Secondary | ICD-10-CM | POA: Diagnosis not present

## 2014-08-06 DIAGNOSIS — M79604 Pain in right leg: Secondary | ICD-10-CM | POA: Diagnosis not present

## 2014-08-06 DIAGNOSIS — M545 Low back pain: Secondary | ICD-10-CM | POA: Diagnosis not present

## 2014-08-08 DIAGNOSIS — M545 Low back pain: Secondary | ICD-10-CM | POA: Diagnosis not present

## 2014-08-08 DIAGNOSIS — M79604 Pain in right leg: Secondary | ICD-10-CM | POA: Diagnosis not present

## 2014-08-08 DIAGNOSIS — M6281 Muscle weakness (generalized): Secondary | ICD-10-CM | POA: Diagnosis not present

## 2014-08-13 DIAGNOSIS — M545 Low back pain: Secondary | ICD-10-CM | POA: Diagnosis not present

## 2014-08-13 DIAGNOSIS — M79604 Pain in right leg: Secondary | ICD-10-CM | POA: Diagnosis not present

## 2014-08-13 DIAGNOSIS — M6281 Muscle weakness (generalized): Secondary | ICD-10-CM | POA: Diagnosis not present

## 2014-08-15 DIAGNOSIS — M6281 Muscle weakness (generalized): Secondary | ICD-10-CM | POA: Diagnosis not present

## 2014-08-15 DIAGNOSIS — M79604 Pain in right leg: Secondary | ICD-10-CM | POA: Diagnosis not present

## 2014-08-15 DIAGNOSIS — M545 Low back pain: Secondary | ICD-10-CM | POA: Diagnosis not present

## 2014-08-16 DIAGNOSIS — M545 Low back pain: Secondary | ICD-10-CM | POA: Diagnosis not present

## 2014-08-16 DIAGNOSIS — M79604 Pain in right leg: Secondary | ICD-10-CM | POA: Diagnosis not present

## 2014-08-16 DIAGNOSIS — M6281 Muscle weakness (generalized): Secondary | ICD-10-CM | POA: Diagnosis not present

## 2014-08-20 DIAGNOSIS — M545 Low back pain: Secondary | ICD-10-CM | POA: Diagnosis not present

## 2014-08-20 DIAGNOSIS — M6281 Muscle weakness (generalized): Secondary | ICD-10-CM | POA: Diagnosis not present

## 2014-08-20 DIAGNOSIS — M79604 Pain in right leg: Secondary | ICD-10-CM | POA: Diagnosis not present

## 2014-08-22 DIAGNOSIS — M545 Low back pain: Secondary | ICD-10-CM | POA: Diagnosis not present

## 2014-08-22 DIAGNOSIS — M6281 Muscle weakness (generalized): Secondary | ICD-10-CM | POA: Diagnosis not present

## 2014-08-22 DIAGNOSIS — M79604 Pain in right leg: Secondary | ICD-10-CM | POA: Diagnosis not present

## 2014-08-23 DIAGNOSIS — H3531 Nonexudative age-related macular degeneration: Secondary | ICD-10-CM | POA: Diagnosis not present

## 2014-08-23 DIAGNOSIS — Z961 Presence of intraocular lens: Secondary | ICD-10-CM | POA: Diagnosis not present

## 2014-08-23 DIAGNOSIS — H1851 Endothelial corneal dystrophy: Secondary | ICD-10-CM | POA: Diagnosis not present

## 2014-08-24 DIAGNOSIS — M79604 Pain in right leg: Secondary | ICD-10-CM | POA: Diagnosis not present

## 2014-08-24 DIAGNOSIS — M6281 Muscle weakness (generalized): Secondary | ICD-10-CM | POA: Diagnosis not present

## 2014-08-24 DIAGNOSIS — M545 Low back pain: Secondary | ICD-10-CM | POA: Diagnosis not present

## 2014-08-27 DIAGNOSIS — M545 Low back pain: Secondary | ICD-10-CM | POA: Diagnosis not present

## 2014-08-27 DIAGNOSIS — M79604 Pain in right leg: Secondary | ICD-10-CM | POA: Diagnosis not present

## 2014-08-27 DIAGNOSIS — M6281 Muscle weakness (generalized): Secondary | ICD-10-CM | POA: Diagnosis not present

## 2014-09-04 DIAGNOSIS — M6281 Muscle weakness (generalized): Secondary | ICD-10-CM | POA: Diagnosis not present

## 2014-09-04 DIAGNOSIS — M545 Low back pain: Secondary | ICD-10-CM | POA: Diagnosis not present

## 2014-09-04 DIAGNOSIS — M79604 Pain in right leg: Secondary | ICD-10-CM | POA: Diagnosis not present

## 2014-09-05 DIAGNOSIS — M6281 Muscle weakness (generalized): Secondary | ICD-10-CM | POA: Diagnosis not present

## 2014-09-05 DIAGNOSIS — M79604 Pain in right leg: Secondary | ICD-10-CM | POA: Diagnosis not present

## 2014-09-05 DIAGNOSIS — M545 Low back pain: Secondary | ICD-10-CM | POA: Diagnosis not present

## 2014-09-07 DIAGNOSIS — M6281 Muscle weakness (generalized): Secondary | ICD-10-CM | POA: Diagnosis not present

## 2014-09-07 DIAGNOSIS — M545 Low back pain: Secondary | ICD-10-CM | POA: Diagnosis not present

## 2014-09-07 DIAGNOSIS — M79604 Pain in right leg: Secondary | ICD-10-CM | POA: Diagnosis not present

## 2014-09-10 DIAGNOSIS — M6281 Muscle weakness (generalized): Secondary | ICD-10-CM | POA: Diagnosis not present

## 2014-09-10 DIAGNOSIS — M545 Low back pain: Secondary | ICD-10-CM | POA: Diagnosis not present

## 2014-09-10 DIAGNOSIS — M79604 Pain in right leg: Secondary | ICD-10-CM | POA: Diagnosis not present

## 2014-09-12 DIAGNOSIS — M6281 Muscle weakness (generalized): Secondary | ICD-10-CM | POA: Diagnosis not present

## 2014-09-12 DIAGNOSIS — M79604 Pain in right leg: Secondary | ICD-10-CM | POA: Diagnosis not present

## 2014-09-12 DIAGNOSIS — M545 Low back pain: Secondary | ICD-10-CM | POA: Diagnosis not present

## 2014-09-14 DIAGNOSIS — M545 Low back pain: Secondary | ICD-10-CM | POA: Diagnosis not present

## 2014-09-14 DIAGNOSIS — M6281 Muscle weakness (generalized): Secondary | ICD-10-CM | POA: Diagnosis not present

## 2014-09-14 DIAGNOSIS — M79604 Pain in right leg: Secondary | ICD-10-CM | POA: Diagnosis not present

## 2014-09-17 DIAGNOSIS — M545 Low back pain: Secondary | ICD-10-CM | POA: Diagnosis not present

## 2014-09-17 DIAGNOSIS — M79604 Pain in right leg: Secondary | ICD-10-CM | POA: Diagnosis not present

## 2014-09-17 DIAGNOSIS — M6281 Muscle weakness (generalized): Secondary | ICD-10-CM | POA: Diagnosis not present

## 2014-09-19 DIAGNOSIS — M545 Low back pain: Secondary | ICD-10-CM | POA: Diagnosis not present

## 2014-09-19 DIAGNOSIS — M6281 Muscle weakness (generalized): Secondary | ICD-10-CM | POA: Diagnosis not present

## 2014-09-19 DIAGNOSIS — M79604 Pain in right leg: Secondary | ICD-10-CM | POA: Diagnosis not present

## 2014-09-21 DIAGNOSIS — M6281 Muscle weakness (generalized): Secondary | ICD-10-CM | POA: Diagnosis not present

## 2014-09-21 DIAGNOSIS — M79604 Pain in right leg: Secondary | ICD-10-CM | POA: Diagnosis not present

## 2014-09-21 DIAGNOSIS — M545 Low back pain: Secondary | ICD-10-CM | POA: Diagnosis not present

## 2014-09-24 DIAGNOSIS — M6281 Muscle weakness (generalized): Secondary | ICD-10-CM | POA: Diagnosis not present

## 2014-09-24 DIAGNOSIS — M545 Low back pain: Secondary | ICD-10-CM | POA: Diagnosis not present

## 2014-09-24 DIAGNOSIS — M79604 Pain in right leg: Secondary | ICD-10-CM | POA: Diagnosis not present

## 2014-09-26 DIAGNOSIS — M6281 Muscle weakness (generalized): Secondary | ICD-10-CM | POA: Diagnosis not present

## 2014-09-26 DIAGNOSIS — M79604 Pain in right leg: Secondary | ICD-10-CM | POA: Diagnosis not present

## 2014-09-26 DIAGNOSIS — M545 Low back pain: Secondary | ICD-10-CM | POA: Diagnosis not present

## 2014-09-27 DIAGNOSIS — M79604 Pain in right leg: Secondary | ICD-10-CM | POA: Diagnosis not present

## 2014-09-27 DIAGNOSIS — M545 Low back pain: Secondary | ICD-10-CM | POA: Diagnosis not present

## 2014-09-27 DIAGNOSIS — M6281 Muscle weakness (generalized): Secondary | ICD-10-CM | POA: Diagnosis not present

## 2014-10-01 DIAGNOSIS — M6281 Muscle weakness (generalized): Secondary | ICD-10-CM | POA: Diagnosis not present

## 2014-10-01 DIAGNOSIS — M545 Low back pain: Secondary | ICD-10-CM | POA: Diagnosis not present

## 2014-10-01 DIAGNOSIS — M79604 Pain in right leg: Secondary | ICD-10-CM | POA: Diagnosis not present

## 2014-10-16 ENCOUNTER — Telehealth: Payer: Self-pay | Admitting: *Deleted

## 2014-10-16 NOTE — Telephone Encounter (Signed)
Patient called and stated that he had bloodwork scheduled for this morning at [facility] and the nurse did not show up. Stated that he was there at 7:00am and waited. Patient upset because the nurse never showed. Patient stated that he has put a call into the apt coordinator nurse.

## 2014-10-18 DIAGNOSIS — I10 Essential (primary) hypertension: Secondary | ICD-10-CM | POA: Diagnosis not present

## 2014-10-18 DIAGNOSIS — E039 Hypothyroidism, unspecified: Secondary | ICD-10-CM | POA: Diagnosis not present

## 2014-10-18 DIAGNOSIS — D649 Anemia, unspecified: Secondary | ICD-10-CM | POA: Diagnosis not present

## 2014-10-18 LAB — CBC AND DIFFERENTIAL: WBC: 6.4 10^3/mL

## 2014-10-18 LAB — HEPATIC FUNCTION PANEL
ALT: 12 U/L (ref 10–40)
AST: 22 U/L (ref 14–40)
Alkaline Phosphatase: 40 U/L (ref 25–125)
Bilirubin, Total: 0.5 mg/dL

## 2014-10-18 LAB — BASIC METABOLIC PANEL
BUN: 14 mg/dL (ref 4–21)
Creatinine: 1 mg/dL (ref 0.6–1.3)
Glucose: 92 mg/dL
Potassium: 4.3 mmol/L (ref 3.4–5.3)
Sodium: 139 mmol/L (ref 137–147)

## 2014-10-18 LAB — TSH: TSH: 2.23 u[IU]/mL (ref 0.41–5.90)

## 2014-10-22 ENCOUNTER — Other Ambulatory Visit: Payer: Self-pay | Admitting: *Deleted

## 2014-10-23 DIAGNOSIS — N400 Enlarged prostate without lower urinary tract symptoms: Secondary | ICD-10-CM | POA: Diagnosis not present

## 2014-10-23 DIAGNOSIS — R3915 Urgency of urination: Secondary | ICD-10-CM | POA: Diagnosis not present

## 2014-10-25 ENCOUNTER — Encounter: Payer: Self-pay | Admitting: Nurse Practitioner

## 2014-10-25 ENCOUNTER — Non-Acute Institutional Stay: Payer: Medicare Other | Admitting: Nurse Practitioner

## 2014-10-25 VITALS — BP 124/60 | HR 68 | Temp 97.8°F | Wt 137.0 lb

## 2014-10-25 DIAGNOSIS — R58 Hemorrhage, not elsewhere classified: Secondary | ICD-10-CM

## 2014-10-25 DIAGNOSIS — R609 Edema, unspecified: Secondary | ICD-10-CM | POA: Diagnosis not present

## 2014-10-25 DIAGNOSIS — N400 Enlarged prostate without lower urinary tract symptoms: Secondary | ICD-10-CM | POA: Diagnosis not present

## 2014-10-25 DIAGNOSIS — M810 Age-related osteoporosis without current pathological fracture: Secondary | ICD-10-CM | POA: Diagnosis not present

## 2014-10-25 DIAGNOSIS — I10 Essential (primary) hypertension: Secondary | ICD-10-CM

## 2014-10-25 DIAGNOSIS — E039 Hypothyroidism, unspecified: Secondary | ICD-10-CM | POA: Diagnosis not present

## 2014-10-25 NOTE — Assessment & Plan Note (Addendum)
No urinary retention, takes Tamsulosin and Finasteride. 1-2x/night bathroom trips. 10/23/14 Urology: asymptomatic f/u 1 year

## 2014-10-25 NOTE — Assessment & Plan Note (Signed)
race left pedal edema noted-not apparent. Dc Furosemide.

## 2014-10-25 NOTE — Assessment & Plan Note (Signed)
Controlled on Losartan 25mg , Bun/creat 24/1.06 10/19/13 and 14/1.0 04/17/14

## 2014-10-25 NOTE — Assessment & Plan Note (Signed)
Takes Prolia.  

## 2014-10-25 NOTE — Progress Notes (Signed)
Patient ID: Cory Burton, male   DOB: [birth year], 81 y.o.   MRN: [id number]   Code Status: Living Will and POA  Allergies  Allergen Reactions  . Horse-Derived Products     Horse serum    Chief Complaint  Patient presents with  . Medical Management of Chronic Issues    blood pressure, thyroid, edema    HPI: Patient is a 81 y.o. male seen in the clinic at [facility] today for chronic medical conditions.  Problem List Items Addressed This Visit    Osteoporosis    Takes Prolia       Relevant Medications   Cholecalciferol (VITAMIN D) 2000 UNITS tablet   Hypothyroidism - Primary    10/19/14 TSH 2.23 takes Levothyroxine 137mcg daily.        HTN (hypertension)    Controlled on Losartan 25mg , Bun/creat 24/1.06 10/19/13 and 14/1.0 04/17/14       Edema    race left pedal edema noted-not apparent. Dc Furosemide.        Ecchymosis    Spontaneous--Hgb 12.1 and Plt227 10/18/14       BPH (benign prostatic hyperplasia)    No urinary retention, takes Tamsulosin and Finasteride. 1-2x/night bathroom trips. 10/23/14 Urology: asymptomatic f/u 1 year          Review of Systems:  Review of Systems  Constitutional: Negative for fever, chills and diaphoresis.  HENT: Positive for hearing loss. Negative for congestion, ear discharge, ear pain, nosebleeds and tinnitus.   Eyes: Negative for photophobia, pain, discharge and redness.  Respiratory: Negative for cough, shortness of breath, wheezing and stridor.   Cardiovascular: Positive for leg swelling. Negative for chest pain.       Trace left pedal edema since the left hip fx surgical repair 12/2011-resolved.   Gastrointestinal: Negative for nausea, vomiting, abdominal pain, diarrhea, constipation and blood in stool.       FOBT negative. External hemorrhoids.   Endocrine: Negative for polydipsia.  Genitourinary: Positive for frequency. Negative for dysuria, urgency, hematuria and flank pain.  Musculoskeletal: Positive for back pain.  Negative for myalgias and neck pain.       R sciatica nerve pain.   Skin: Negative for rash.       Spontaneous echhymosis  Allergic/Immunologic: Negative for environmental allergies.  Neurological: Negative for dizziness, tremors, seizures, weakness and headaches.  Hematological: Bruises/bleeds easily.       Limbs  Psychiatric/Behavioral: Negative for suicidal ideas and hallucinations. The patient is not nervous/anxious.       Past Medical History  Diagnosis Date  . Thyroid disease   . Hypertension   . BPH (benign prostatic hyperplasia)   . Fracture, intertrochanteric, left femur 12/17/2011  . Senile osteoporosis 08/25/2012  . Edema 04/28/2012  . Unspecified hearing loss 12/24/2011  . Cardiomegaly 12/24/2011  . Degeneration of thoracic or thoracolumbar intervertebral disc 12/24/2011  . Spinal stenosis, lumbar region, without neurogenic claudication 12/24/2011  . Other abnormal blood chemistry 12/24/2011  . Unspecified hypothyroidism 12/23/2011  . Acute posthemorrhagic anemia 12/23/2011   Past Surgical History  Procedure Laterality Date  . Foot surgery    . Compression hip screw  12/17/2011    Procedure: COMPRESSION HIP;  Surgeon: Johnny Bridge, MD;  Location: [facility];  Service: Orthopedics;  Laterality: Left;   Social History:   reports that he quit smoking about 29 years ago. His smoking use included Pipe. He has never used smokeless tobacco. He reports that he drinks about 0.6 oz of alcohol per  week. He reports that he does not use illicit drugs.  Family History  Problem Relation Age of Onset  . Cancer Mother     Medications: Patient's Medications  New Prescriptions   No medications on file  Previous Medications   ASPIRIN 81 MG TABLET    Take 81 mg by mouth daily.   B COMPLEX VITAMINS (VITAMIN B COMPLEX PO)    Take by mouth. Take one tablet daily   CALCIUM-VITAMIN D (OSCAL WITH D) 500-200 MG-UNIT PER TABLET    Take 1 tablet by mouth daily.   CHOLECALCIFEROL (VITAMIN D) 2000 UNITS  TABLET    Take 2,000 Units by mouth daily.   DENOSUMAB (PROLIA) 60 MG/ML SOLN INJECTION    Inject 60 mg into the skin every 6 (six) months. Administer in upper arm, thigh, or abdomen   FINASTERIDE (PROSCAR) 5 MG TABLET    Take 5 mg by mouth daily.   FUROSEMIDE (LASIX) 20 MG TABLET    Take 20 mg by mouth. Take 1/2 tablet every other day for edema   LEVOTHYROXINE (SYNTHROID, LEVOTHROID) 137 MCG TABLET    Take one tablet by mouth 30 minutes before breakfast for thyroid.   LOSARTAN (COZAAR) 50 MG TABLET    TAKE 1/2 TABLET DAILY TO   STRENGTHEN THE HEART AND   CONTROL BLOOD PRESSURE   MULTIPLE VITAMIN (MULTIVITAMIN) TABLET    Take 1 tablet by mouth daily.   OMEGA-3 300 MG CAPS    Take by mouth. Take one daily   RESVERATROL 100 MG CAPS    Take by mouth. Take one daily   TAMSULOSIN HCL (FLOMAX) 0.4 MG CAPS    Take 0.4 mg by mouth daily.   TRIAMCINOLONE CREAM (KENALOG) 0.1 %    Apply to  Lower legs twice daily   VITAMIN C (ASCORBIC ACID) 500 MG TABLET    Take 500 mg by mouth daily.   VITAMIN E (VITAMIN E) 400 UNIT CAPSULE    Take 400 Units by mouth daily.  Modified Medications   No medications on file  Discontinued Medications   No medications on file     Physical Exam: Physical Exam  Constitutional: He is oriented to person, place, and time. He appears well-developed and well-nourished. No distress.  HENT:  Head: Normocephalic and atraumatic.  Right Ear: External ear normal.  Left Ear: External ear normal.  Nose: Nose normal.  Mouth/Throat: Oropharynx is clear and moist. No oropharyngeal exudate.  Eyes: Conjunctivae and EOM are normal. Pupils are equal, round, and reactive to light. Right eye exhibits no discharge. Left eye exhibits no discharge. No scleral icterus.  Neck: Normal range of motion. Neck supple. No JVD present. No tracheal deviation present. No thyromegaly present.  Cardiovascular: Normal rate, regular rhythm, normal heart sounds and intact distal pulses.   No murmur  heard. Pulmonary/Chest: Effort normal and breath sounds normal. No stridor. No respiratory distress. He has no wheezes. He has no rales. He exhibits no tenderness.  Abdominal: Soft. Bowel sounds are normal. He exhibits no distension. There is no tenderness. There is no rebound and no guarding.  External hemorrhoids.   Genitourinary: Guaiac negative stool.  Musculoskeletal: Normal range of motion. He exhibits edema. He exhibits no tenderness.  Trace left pedal edema since the left hip surgery 12/2011.   Lymphadenopathy:    He has no cervical adenopathy.  Neurological: He is alert and oriented to person, place, and time. He has normal reflexes. No cranial nerve deficit. He exhibits normal muscle tone.  Coordination normal.  Skin: Skin is warm and dry. No rash noted. He is not diaphoretic. No erythema. No pallor.  Spontaneous echhymosis   Psychiatric: He has a normal mood and affect. His behavior is normal. Judgment and thought content normal.    Filed Vitals:   10/25/14 1359  BP: 124/60  Pulse: 68  Temp: 97.8 F (36.6 C)  TempSrc: Oral  Weight: 137 lb (62.143 kg)      Labs reviewed: Basic Metabolic Panel:  Recent Labs  04/17/14 10/18/14 10/19/14  NA 139 139  --   K 4.2 4.3  --   BUN 14 14  --   CREATININE 1.0 1.0  --   TSH  --   --  2.23   Liver Function Tests:  Recent Labs  10/18/14  AST 22  ALT 12  ALKPHOS 40   No results for input(s): LIPASE, AMYLASE in the last 8760 hours. No results for input(s): AMMONIA in the last 8760 hours. CBC:  Recent Labs  06/26/14 10/18/14  WBC 5.7 6.4  HGB 11.7*  --   HCT 34*  --   PLT 225  --    Lipid Panel: No results for input(s): CHOL, HDL, LDLCALC, TRIG, CHOLHDL, LDLDIRECT in the last 8760 hours. Anemia Panel: No results for input(s): FOLATE, IRON, VITAMINB12 in the last 8760 hours.  Past Procedures:     Assessment/Plan Hypothyroidism 10/19/14 TSH 2.23 takes Levothyroxine 16mcg daily.     BPH (benign prostatic  hyperplasia) No urinary retention, takes Tamsulosin and Finasteride. 1-2x/night bathroom trips. 10/23/14 Urology: asymptomatic f/u 1 year    HTN (hypertension) Controlled on Losartan 25mg , Bun/creat 24/1.06 10/19/13 and 14/1.0 04/17/14    Edema race left pedal edema noted-not apparent. Dc Furosemide.     Osteoporosis Takes Prolia    Ecchymosis Spontaneous--Hgb 12.1 and Plt227 10/18/14      Family/ Staff Communication: observe the patient.   Goals of Care: IL  Labs/tests ordered: CBC, CMP, TSH prior to next appoint in one year.

## 2014-10-25 NOTE — Assessment & Plan Note (Signed)
10/19/14 TSH 2.23 takes Levothyroxine 175mcg daily.

## 2014-10-25 NOTE — Assessment & Plan Note (Signed)
Spontaneous--Hgb 12.1 and Plt227 10/18/14

## 2014-10-29 DIAGNOSIS — R5383 Other fatigue: Secondary | ICD-10-CM | POA: Diagnosis not present

## 2014-10-29 DIAGNOSIS — E559 Vitamin D deficiency, unspecified: Secondary | ICD-10-CM | POA: Diagnosis not present

## 2014-10-29 DIAGNOSIS — M810 Age-related osteoporosis without current pathological fracture: Secondary | ICD-10-CM | POA: Diagnosis not present

## 2014-10-29 DIAGNOSIS — M4806 Spinal stenosis, lumbar region: Secondary | ICD-10-CM | POA: Diagnosis not present

## 2014-11-07 DIAGNOSIS — M810 Age-related osteoporosis without current pathological fracture: Secondary | ICD-10-CM | POA: Diagnosis not present

## 2014-11-07 DIAGNOSIS — M4806 Spinal stenosis, lumbar region: Secondary | ICD-10-CM | POA: Diagnosis not present

## 2015-01-23 ENCOUNTER — Other Ambulatory Visit: Payer: Self-pay | Admitting: Dermatology

## 2015-01-23 DIAGNOSIS — L570 Actinic keratosis: Secondary | ICD-10-CM | POA: Diagnosis not present

## 2015-01-23 DIAGNOSIS — L309 Dermatitis, unspecified: Secondary | ICD-10-CM | POA: Diagnosis not present

## 2015-01-23 DIAGNOSIS — D485 Neoplasm of uncertain behavior of skin: Secondary | ICD-10-CM | POA: Diagnosis not present

## 2015-02-20 ENCOUNTER — Ambulatory Visit (INDEPENDENT_AMBULATORY_CARE_PROVIDER_SITE_OTHER): Payer: Medicare Other | Admitting: Internal Medicine

## 2015-02-20 ENCOUNTER — Encounter: Payer: Self-pay | Admitting: Internal Medicine

## 2015-02-20 VITALS — BP 110/52 | HR 90 | Temp 98.6°F | Resp 18 | Ht 65.5 in | Wt 133.2 lb

## 2015-02-20 DIAGNOSIS — I10 Essential (primary) hypertension: Secondary | ICD-10-CM | POA: Diagnosis not present

## 2015-02-20 DIAGNOSIS — R5081 Fever presenting with conditions classified elsewhere: Secondary | ICD-10-CM | POA: Diagnosis not present

## 2015-02-20 DIAGNOSIS — M5431 Sciatica, right side: Secondary | ICD-10-CM | POA: Diagnosis not present

## 2015-02-20 DIAGNOSIS — R197 Diarrhea, unspecified: Secondary | ICD-10-CM | POA: Diagnosis not present

## 2015-02-20 DIAGNOSIS — E039 Hypothyroidism, unspecified: Secondary | ICD-10-CM | POA: Diagnosis not present

## 2015-02-20 NOTE — Progress Notes (Signed)
Patient ID: Cory Burton, male   DOB: Mar 29, 1918, 81 y.o.   MRN: [id number]    Facility  [facility]    Place of Service:   OFFICE    Allergies  Allergen Reactions  . Horse-Derived Products     Horse serum    Chief Complaint  Patient presents with  . Acute Visit    fever and chills    HPI:  Had some diarrhea recently. Fecal icontinent once.  Had right sciatica get worse, but is better now.  BP high at [facility].  TSH normal in March 2016.  Had some dental surgery recently. Had to take an antibiotic, but last dose was 10 days ago.  Medications: Patient's Medications  New Prescriptions   No medications on file  Previous Medications   ASPIRIN 81 MG TABLET    Take 81 mg by mouth daily.   B COMPLEX VITAMINS (VITAMIN B COMPLEX PO)    Take by mouth. Take one tablet daily   CALCIUM-VITAMIN D (OSCAL WITH D) 500-200 MG-UNIT PER TABLET    Take 1 tablet by mouth daily.   CHOLECALCIFEROL (VITAMIN D) 2000 UNITS TABLET    Take 2,000 Units by mouth daily.   DENOSUMAB (PROLIA) 60 MG/ML SOLN INJECTION    Inject 60 mg into the skin every 6 (six) months. Administer in upper arm, thigh, or abdomen   FINASTERIDE (PROSCAR) 5 MG TABLET    Take 5 mg by mouth daily.   FUROSEMIDE (LASIX) 20 MG TABLET    Take 20 mg by mouth. Take 1/2 tablet every other day for edema   LEVOTHYROXINE (SYNTHROID, LEVOTHROID) 137 MCG TABLET    Take one tablet by mouth 30 minutes before breakfast for thyroid.   LOSARTAN (COZAAR) 50 MG TABLET    TAKE 1/2 TABLET DAILY TO   STRENGTHEN THE HEART AND   CONTROL BLOOD PRESSURE   MULTIPLE VITAMIN (MULTIVITAMIN) TABLET    Take 1 tablet by mouth daily.   OMEGA-3 300 MG CAPS    Take by mouth. Take one daily   RESVERATROL 100 MG CAPS    Take by mouth. Take one daily   TAMSULOSIN HCL (FLOMAX) 0.4 MG CAPS    Take 0.4 mg by mouth daily.   TRIAMCINOLONE CREAM (KENALOG) 0.1 %    Apply to  Lower legs twice daily   VITAMIN C (ASCORBIC ACID) 500 MG TABLET    Take 500 mg by mouth  daily.   VITAMIN E (VITAMIN E) 400 UNIT CAPSULE    Take 400 Units by mouth daily.  Modified Medications   No medications on file  Discontinued Medications   No medications on file     Review of Systems  Constitutional: Negative for fever, chills and diaphoresis.  HENT: Positive for hearing loss. Negative for congestion, ear discharge, ear pain, nosebleeds and tinnitus.   Eyes: Negative for photophobia, pain, discharge and redness.  Respiratory: Negative for cough, shortness of breath, wheezing and stridor.        Hoarse  Cardiovascular: Positive for leg swelling. Negative for chest pain.       Trace left pedal edema since the left hip fx surgical repair 12/2011-resolved.   Gastrointestinal: Positive for diarrhea. Negative for nausea, vomiting, abdominal pain, constipation and blood in stool.       FOBT negative. External hemorrhoids.   Endocrine: Negative for polydipsia.  Genitourinary: Positive for frequency. Negative for dysuria, urgency, hematuria and flank pain.  Musculoskeletal: Positive for back pain. Negative for myalgias and neck  pain.       R sciatica nerve pain.  History of OP. Treated with Prolia.  Skin: Negative for rash.       Spontaneous echhymosis  Allergic/Immunologic: Negative for environmental allergies.  Neurological: Negative for dizziness, tremors, seizures, weakness and headaches.  Hematological: Bruises/bleeds easily.       Limbs  Psychiatric/Behavioral: Negative for suicidal ideas and hallucinations. The patient is not nervous/anxious.     Filed Vitals:   02/20/15 1150  BP: 110/52  Pulse: 90  Temp: 98.6 F (37 C)  TempSrc: Oral  Resp: 18  Height: 5' 5.5" (1.664 m)  Weight: 133 lb 3.2 oz (60.419 kg)  SpO2: 98%   Body mass index is 21.82 kg/(m^2).  Physical Exam  Constitutional: He is oriented to person, place, and time. He appears well-developed and well-nourished. No distress.  HENT:  Head: Normocephalic and atraumatic.  Right Ear: External  ear normal.  Left Ear: External ear normal.  Nose: Nose normal.  Mouth/Throat: Oropharynx is clear and moist. No oropharyngeal exudate.  Eyes: Conjunctivae and EOM are normal. Pupils are equal, round, and reactive to light. Right eye exhibits no discharge. Left eye exhibits no discharge. No scleral icterus.  Neck: Normal range of motion. Neck supple. No JVD present. No tracheal deviation present. No thyromegaly present.  Cardiovascular: Normal rate, regular rhythm, normal heart sounds and intact distal pulses.   No murmur heard. Pulmonary/Chest: Effort normal and breath sounds normal. No stridor. No respiratory distress. He has no wheezes. He has no rales. He exhibits no tenderness.  Hoarse  Abdominal: Soft. Bowel sounds are normal. He exhibits no distension. There is no tenderness. There is no rebound and no guarding.  External hemorrhoids.   Genitourinary: Guaiac negative stool.  Musculoskeletal: Normal range of motion. He exhibits edema. He exhibits no tenderness.  Trace left pedal edema since the left hip surgery 12/2011.  Using cane for balance.  Lymphadenopathy:    He has no cervical adenopathy.  Neurological: He is alert and oriented to person, place, and time. He has normal reflexes. No cranial nerve deficit. He exhibits normal muscle tone. Coordination normal.  Skin: Skin is warm and dry. No rash noted. He is not diaphoretic. No erythema. No pallor.  Spontaneous echhymosis   Psychiatric: He has a normal mood and affect. His behavior is normal. Judgment and thought content normal.     Labs reviewed: No visits with results within 3 Month(s) from this visit. Latest known visit with results is:  Abstract on 10/23/2014  Component Date Value Ref Range Status  . WBC 10/18/2014 6.4   Final  . Glucose 10/18/2014 92   Final  . BUN 10/18/2014 14  4 - 21 mg/dL Final  . Creatinine 10/18/2014 1.0  0.6 - 1.3 mg/dL Final  . Potassium 10/18/2014 4.3  3.4 - 5.3 mmol/L Final  . Sodium  10/18/2014 139  137 - 147 mmol/L Final  . Alkaline Phosphatase 10/18/2014 40  25 - 125 U/L Final  . ALT 10/18/2014 12  10 - 40 U/L Final  . AST 10/18/2014 22  14 - 40 U/L Final  . Bilirubin, Total 10/18/2014 0.5   Final     Assessment/Plan  1. Fever presenting with conditions classified elsewhere - CBC With Differential - CMP - Urine culture - Urinalysis  2. Diarrhea Possibly related to use of antibiotic recently - CMP  3. Essential hypertension controlled - CMP  4. Hypothyroidism, unspecified hypothyroidism type Normal TSH in March 2016  5. Sciatica of  right side Chronic. Intermittent. Currently doing okay. Using cane.

## 2015-02-21 LAB — CBC WITH DIFFERENTIAL
Basophils Absolute: 0 10*3/uL (ref 0.0–0.2)
Basos: 0 %
EOS (ABSOLUTE): 0 10*3/uL (ref 0.0–0.4)
Eos: 0 %
Hematocrit: 31.8 % — ABNORMAL LOW (ref 37.5–51.0)
Hemoglobin: 10.8 g/dL — ABNORMAL LOW (ref 12.6–17.7)
Immature Grans (Abs): 0 10*3/uL (ref 0.0–0.1)
Immature Granulocytes: 0 %
Lymphocytes Absolute: 0.7 10*3/uL (ref 0.7–3.1)
Lymphs: 6 %
MCH: 34.2 pg — ABNORMAL HIGH (ref 26.6–33.0)
MCHC: 34 g/dL (ref 31.5–35.7)
MCV: 101 fL — ABNORMAL HIGH (ref 79–97)
Monocytes Absolute: 0.8 10*3/uL (ref 0.1–0.9)
Monocytes: 7 %
Neutrophils Absolute: 10.6 10*3/uL — ABNORMAL HIGH (ref 1.4–7.0)
Neutrophils: 87 %
RBC: 3.16 x10E6/uL — ABNORMAL LOW (ref 4.14–5.80)
RDW: 14 % (ref 12.3–15.4)
WBC: 12.2 10*3/uL — ABNORMAL HIGH (ref 3.4–10.8)

## 2015-02-21 LAB — URINALYSIS
Bilirubin, UA: NEGATIVE
Glucose, UA: NEGATIVE
Ketones, UA: NEGATIVE
Leukocytes, UA: NEGATIVE
Nitrite, UA: NEGATIVE
RBC, UA: NEGATIVE
Specific Gravity, UA: 1.023 (ref 1.005–1.030)
Urobilinogen, Ur: 0.2 mg/dL (ref 0.2–1.0)
pH, UA: 5.5 (ref 5.0–7.5)

## 2015-02-21 LAB — COMPREHENSIVE METABOLIC PANEL
ALT: 14 IU/L (ref 0–44)
AST: 28 IU/L (ref 0–40)
Albumin/Globulin Ratio: 1.6 (ref 1.1–2.5)
Albumin: 3.7 g/dL (ref 3.2–4.6)
Alkaline Phosphatase: 39 IU/L (ref 39–117)
BUN/Creatinine Ratio: 29 — ABNORMAL HIGH (ref 10–22)
BUN: 35 mg/dL (ref 10–36)
Bilirubin Total: 0.4 mg/dL (ref 0.0–1.2)
CO2: 24 mmol/L (ref 18–29)
Calcium: 9.7 mg/dL (ref 8.6–10.2)
Chloride: 97 mmol/L (ref 97–108)
Creatinine, Ser: 1.21 mg/dL (ref 0.76–1.27)
GFR calc Af Amer: 59 mL/min/{1.73_m2} — ABNORMAL LOW (ref >59)
GFR calc non Af Amer: 51 mL/min/{1.73_m2} — ABNORMAL LOW (ref >59)
Globulin, Total: 2.3 g/dL (ref 1.5–4.5)
Glucose: 110 mg/dL — ABNORMAL HIGH (ref 65–99)
Potassium: 4.4 mmol/L (ref 3.5–5.2)
Sodium: 134 mmol/L (ref 134–144)
Total Protein: 6 g/dL (ref 6.0–8.5)

## 2015-02-22 LAB — URINE CULTURE: Organism ID, Bacteria: NO GROWTH

## 2015-02-25 ENCOUNTER — Inpatient Hospital Stay (HOSPITAL_COMMUNITY)
Admission: EM | Admit: 2015-02-25 | Discharge: 2015-02-27 | DRG: 373 | Disposition: A | Discharge status: Home / Self Care | Payer: Medicare Other | Attending: Internal Medicine | Admitting: Internal Medicine

## 2015-02-25 ENCOUNTER — Encounter (HOSPITAL_COMMUNITY): Payer: Self-pay | Admitting: *Deleted

## 2015-02-25 ENCOUNTER — Emergency Department (HOSPITAL_COMMUNITY): Payer: Medicare Other

## 2015-02-25 DIAGNOSIS — M810 Age-related osteoporosis without current pathological fracture: Secondary | ICD-10-CM | POA: Diagnosis present

## 2015-02-25 DIAGNOSIS — R609 Edema, unspecified: Secondary | ICD-10-CM | POA: Diagnosis present

## 2015-02-25 DIAGNOSIS — E876 Hypokalemia: Secondary | ICD-10-CM | POA: Diagnosis not present

## 2015-02-25 DIAGNOSIS — Z87891 Personal history of nicotine dependence: Secondary | ICD-10-CM | POA: Diagnosis not present

## 2015-02-25 DIAGNOSIS — A047 Enterocolitis due to Clostridium difficile: Principal | ICD-10-CM | POA: Diagnosis present

## 2015-02-25 DIAGNOSIS — E038 Other specified hypothyroidism: Secondary | ICD-10-CM | POA: Diagnosis not present

## 2015-02-25 DIAGNOSIS — R600 Localized edema: Secondary | ICD-10-CM | POA: Diagnosis present

## 2015-02-25 DIAGNOSIS — E860 Dehydration: Secondary | ICD-10-CM | POA: Diagnosis present

## 2015-02-25 DIAGNOSIS — D638 Anemia in other chronic diseases classified elsewhere: Secondary | ICD-10-CM | POA: Diagnosis present

## 2015-02-25 DIAGNOSIS — A0472 Enterocolitis due to Clostridium difficile, not specified as recurrent: Secondary | ICD-10-CM | POA: Diagnosis present

## 2015-02-25 DIAGNOSIS — N400 Enlarged prostate without lower urinary tract symptoms: Secondary | ICD-10-CM | POA: Diagnosis present

## 2015-02-25 DIAGNOSIS — K649 Unspecified hemorrhoids: Secondary | ICD-10-CM | POA: Diagnosis present

## 2015-02-25 DIAGNOSIS — H9190 Unspecified hearing loss, unspecified ear: Secondary | ICD-10-CM | POA: Diagnosis present

## 2015-02-25 DIAGNOSIS — Z7982 Long term (current) use of aspirin: Secondary | ICD-10-CM

## 2015-02-25 DIAGNOSIS — I10 Essential (primary) hypertension: Secondary | ICD-10-CM | POA: Diagnosis present

## 2015-02-25 DIAGNOSIS — K529 Noninfective gastroenteritis and colitis, unspecified: Secondary | ICD-10-CM | POA: Diagnosis not present

## 2015-02-25 DIAGNOSIS — Z66 Do not resuscitate: Secondary | ICD-10-CM | POA: Diagnosis present

## 2015-02-25 DIAGNOSIS — Z79899 Other long term (current) drug therapy: Secondary | ICD-10-CM

## 2015-02-25 DIAGNOSIS — R531 Weakness: Secondary | ICD-10-CM | POA: Diagnosis not present

## 2015-02-25 DIAGNOSIS — R404 Transient alteration of awareness: Secondary | ICD-10-CM | POA: Diagnosis not present

## 2015-02-25 DIAGNOSIS — R1111 Vomiting without nausea: Secondary | ICD-10-CM | POA: Diagnosis not present

## 2015-02-25 DIAGNOSIS — E039 Hypothyroidism, unspecified: Secondary | ICD-10-CM | POA: Diagnosis not present

## 2015-02-25 DIAGNOSIS — K760 Fatty (change of) liver, not elsewhere classified: Secondary | ICD-10-CM | POA: Diagnosis not present

## 2015-02-25 DIAGNOSIS — K6289 Other specified diseases of anus and rectum: Secondary | ICD-10-CM | POA: Diagnosis not present

## 2015-02-25 LAB — CBC WITH DIFFERENTIAL/PLATELET
Basophils Absolute: 0 10*3/uL (ref 0.0–0.1)
Basophils Relative: 0 % (ref 0–1)
Eosinophils Absolute: 0 10*3/uL (ref 0.0–0.7)
Eosinophils Relative: 0 % (ref 0–5)
HCT: 31.9 % — ABNORMAL LOW (ref 39.0–52.0)
Hemoglobin: 10.7 g/dL — ABNORMAL LOW (ref 13.0–17.0)
Lymphocytes Relative: 4 % — ABNORMAL LOW (ref 12–46)
Lymphs Abs: 0.6 10*3/uL — ABNORMAL LOW (ref 0.7–4.0)
MCH: 33.2 pg (ref 26.0–34.0)
MCHC: 33.5 g/dL (ref 30.0–36.0)
MCV: 99.1 fL (ref 78.0–100.0)
Monocytes Absolute: 1.8 10*3/uL — ABNORMAL HIGH (ref 0.1–1.0)
Monocytes Relative: 13 % — ABNORMAL HIGH (ref 3–12)
Neutro Abs: 11.1 10*3/uL — ABNORMAL HIGH (ref 1.7–7.7)
Neutrophils Relative %: 83 % — ABNORMAL HIGH (ref 43–77)
Platelets: 236 10*3/uL (ref 150–400)
RBC: 3.22 MIL/uL — ABNORMAL LOW (ref 4.22–5.81)
RDW: 12.7 % (ref 11.5–15.5)
WBC: 13.5 10*3/uL — ABNORMAL HIGH (ref 4.0–10.5)

## 2015-02-25 LAB — I-STAT CG4 LACTIC ACID, ED: Lactic Acid, Venous: 0.53 mmol/L (ref 0.5–2.0)

## 2015-02-25 LAB — COMPREHENSIVE METABOLIC PANEL
ALT: 18 U/L (ref 17–63)
AST: 27 U/L (ref 15–41)
Albumin: 3.4 g/dL — ABNORMAL LOW (ref 3.5–5.0)
Alkaline Phosphatase: 46 U/L (ref 38–126)
Anion gap: 9 (ref 5–15)
BUN: 25 mg/dL — ABNORMAL HIGH (ref 6–20)
CO2: 25 mmol/L (ref 22–32)
Calcium: 9.3 mg/dL (ref 8.9–10.3)
Chloride: 101 mmol/L (ref 101–111)
Creatinine, Ser: 0.99 mg/dL (ref 0.61–1.24)
GFR calc Af Amer: >60 mL/min (ref >60)
GFR calc non Af Amer: >60 mL/min (ref >60)
Glucose, Bld: 120 mg/dL — ABNORMAL HIGH (ref 65–99)
Potassium: 3.6 mmol/L (ref 3.5–5.1)
Sodium: 135 mmol/L (ref 135–145)
Total Bilirubin: 0.7 mg/dL (ref 0.3–1.2)
Total Protein: 6.6 g/dL (ref 6.5–8.1)

## 2015-02-25 MED ORDER — IOHEXOL 300 MG/ML  SOLN
100.0000 mL | Freq: Once | INTRAMUSCULAR | Status: AC | PRN
  Administered 2015-02-25: 100 mL via INTRAVENOUS

## 2015-02-25 MED ORDER — IOHEXOL 300 MG/ML  SOLN
50.0000 mL | Freq: Once | INTRAMUSCULAR | Status: AC | PRN
  Administered 2015-02-25: 50 mL via ORAL

## 2015-02-25 MED ORDER — SODIUM CHLORIDE 0.9 % IV BOLUS (SEPSIS)
500.0000 mL | Freq: Once | INTRAVENOUS | Status: AC
  Administered 2015-02-25: 500 mL via INTRAVENOUS

## 2015-02-25 MED ORDER — VANCOMYCIN 50 MG/ML ORAL SOLUTION
125.0000 mg | Freq: Four times a day (QID) | ORAL | Status: DC
  Administered 2015-02-25 – 2015-02-27 (×7): 125 mg via ORAL
  Filled 2015-02-25 (×9): qty 2.5

## 2015-02-25 NOTE — ED Notes (Signed)
MD at bedside. 

## 2015-02-25 NOTE — ED Notes (Signed)
RN AT BEDSIDE STARTING IV 

## 2015-02-25 NOTE — ED Notes (Signed)
Pt lives at [facility] at [place], reports staff sent him to the ED for diarrhea and generalized weakness.  Pt is A&O.

## 2015-02-25 NOTE — ED Notes (Signed)
Per EMS, pt presents from assisted living after family insisted he be evaluated.  He has had persistent diarrhea for a week and is getting progressively more fatigued.  Pt is ambulatory and a/o x 4 at baseline. Vital signs en route were WNL.

## 2015-02-25 NOTE — H&P (Addendum)
[facility] History and Physical  Sharod Petsch HCW:[id number] DOB: Jan 06, 1918 DOA: 02/25/2015  Referring physician: Dr. Tomi Bamberger. PCP: Estill Dooms, MD  Specialists: None.  Chief Complaint: Diarrhea.  HPI: Cory Burton is a 81 y.o. male with a history of hypertension, lower extremity edema and hypothyroidism and chronic anemia presents to the ER because of persistent diarrhea and abdominal discomfort. Patient has been on antibiotics for tooth infection 3 weeks ago. Patient started experiencing diarrhea 1 week ago. Patient states he had multiple episodes of diarrhea and one episode of nausea and vomiting. Patient has generalized abdominal discomfort. In the ER CT abdomen and pelvis done shows diffuse colitis. Patient has been admitted for further management. Patient otherwise denies any chest pain or shortness of breath.   Review of Systems: As presented in the history of presenting illness, rest negative.  Past Medical History  Diagnosis Date  . Thyroid disease   . Hypertension   . BPH (benign prostatic hyperplasia)   . Fracture, intertrochanteric, left femur 12/17/2011  . Senile osteoporosis 08/25/2012  . Edema 04/28/2012  . Unspecified hearing loss 12/24/2011  . Cardiomegaly 12/24/2011  . Degeneration of thoracic or thoracolumbar intervertebral disc 12/24/2011  . Spinal stenosis, lumbar region, without neurogenic claudication 12/24/2011  . Other abnormal blood chemistry 12/24/2011  . Unspecified hypothyroidism 12/23/2011  . Acute posthemorrhagic anemia 12/23/2011   Past Surgical History  Procedure Laterality Date  . Foot surgery    . Compression hip screw  12/17/2011    Procedure: COMPRESSION HIP;  Surgeon: Johnny Bridge, MD;  Location: [facility];  Service: Orthopedics;  Laterality: Left;   Social History:  reports that he quit smoking about 29 years ago. His smoking use included Pipe. He has never used smokeless tobacco. He reports that he drinks about 0.6 oz of alcohol per week. He reports  that he does not use illicit drugs. Where does patient live nursing home. Can patient participate in ADLs? Yes.  Allergies  Allergen Reactions  . Horse-Derived Products     Horse serum  . Mold Extract [Trichophyton]     Family History:  Family History  Problem Relation Age of Onset  . Cancer Mother       Prior to Admission medications   Medication Sig Start Date End Date Taking? Authorizing Provider  aspirin 81 MG tablet Take 81 mg by mouth daily.   Yes Historical Provider, MD  B Complex Vitamins (VITAMIN B COMPLEX PO) Take 1 tablet by mouth daily.    Yes Historical Provider, MD  Cholecalciferol (VITAMIN D) 2000 UNITS tablet Take 2,000 Units by mouth daily.   Yes Historical Provider, MD  finasteride (PROSCAR) 5 MG tablet Take 5 mg by mouth daily.   Yes Historical Provider, MD  furosemide (LASIX) 20 MG tablet Take 10 mg by mouth daily.    Yes Historical Provider, MD  levothyroxine (SYNTHROID, LEVOTHROID) 137 MCG tablet Take one tablet by mouth 30 minutes before breakfast for thyroid. 07/02/14  Yes Estill Dooms, MD  losartan (COZAAR) 50 MG tablet TAKE 1/2 TABLET DAILY TO   STRENGTHEN THE HEART AND   CONTROL BLOOD PRESSURE 09/25/13  Yes Man Mast X, NP  Multiple Vitamin (MULTIVITAMIN) tablet Take 1 tablet by mouth daily.   Yes Historical Provider, MD  Omega-3 300 MG CAPS Take 1 capsule by mouth daily.    Yes Historical Provider, MD  Resveratrol 100 MG CAPS Take 1 capsule by mouth daily.    Yes Historical Provider, MD  Tamsulosin HCl (FLOMAX) 0.4  MG CAPS Take 0.4 mg by mouth daily.   Yes Historical Provider, MD  triamcinolone cream (KENALOG) 0.1 % Apply to  Lower legs twice daily 10/02/13  Yes Historical Provider, MD  vitamin C (ASCORBIC ACID) 500 MG tablet Take 500 mg by mouth daily.   Yes Historical Provider, MD  vitamin E (VITAMIN E) 400 UNIT capsule Take 400 Units by mouth daily.   Yes Historical Provider, MD  calcium-vitamin D (OSCAL WITH D) 500-200 MG-UNIT per tablet Take 1 tablet by  mouth daily. 12/17/11 12/16/12  Marchia Bond, MD  denosumab (PROLIA) 60 MG/ML SOLN injection Inject 60 mg into the skin every 6 (six) months. Administer in upper arm, thigh, or abdomen    Historical Provider, MD    Physical Exam: Filed Vitals:   02/25/15 2000 02/25/15 2135 02/25/15 2200 02/25/15 2300  BP: 111/51 111/51 109/53 104/45  Pulse: 93 89 79 75  Temp:      TempSrc:      Resp:  18    SpO2: 96% 100% 98% 97%     General:  Moderately built and nourished.  Eyes: Anicteric no pallor.  ENT: No discharge from the ears eyes nose or mouth.  Neck: No mass felt.  Cardiovascular: S1 and S2 heard.  Respiratory: No rhonchi or crepitations.  Abdomen: Soft nontender bowel sounds present.  Skin: No rash.  Musculoskeletal: No edema.  Psychiatric: Appears normal.  Neurologic: Alert awake oriented to time place and person. Moves all extremities.  Labs on Admission:  Basic Metabolic Panel:  Recent Labs Lab 02/20/15 1241 02/25/15 1640  NA 134 135  K 4.4 3.6  CL 97 101  CO2 24 25  GLUCOSE 110* 120*  BUN 35 25*  CREATININE 1.21 0.99  CALCIUM 9.7 9.3   Liver Function Tests:  Recent Labs Lab 02/20/15 1241 02/25/15 1640  AST 28 27  ALT 14 18  ALKPHOS 39 46  BILITOT 0.4 0.7  PROT 6.0 6.6  ALBUMIN  --  3.4*   No results for input(s): LIPASE, AMYLASE in the last 168 hours. No results for input(s): AMMONIA in the last 168 hours. CBC:  Recent Labs Lab 02/20/15 1241 02/25/15 1640  WBC 12.2* 13.5*  NEUTROABS 10.6* 11.1*  HGB  --  10.7*  HCT 31.8* 31.9*  MCV  --  99.1  PLT  --  236   Cardiac Enzymes: No results for input(s): CKTOTAL, CKMB, CKMBINDEX, TROPONINI in the last 168 hours.  BNP (last 3 results) No results for input(s): BNP in the last 8760 hours.  ProBNP (last 3 results) No results for input(s): PROBNP in the last 8760 hours.  CBG: No results for input(s): GLUCAP in the last 168 hours.  Radiological Exams on Admission: Ct Abdomen Pelvis W  Contrast  02/25/2015   CLINICAL DATA:  81 year old male with persistent diarrhea for 1 week, progressively worsening, with increasing fatigue.  EXAM: CT ABDOMEN AND PELVIS WITH CONTRAST  TECHNIQUE: Multidetector CT imaging of the abdomen and pelvis was performed using the standard protocol following bolus administration of intravenous contrast.  CONTRAST:  89mL OMNIPAQUE IOHEXOL 300 MG/ML SOLN, 150mL OMNIPAQUE IOHEXOL 300 MG/ML SOLN  COMPARISON:  CT of the abdomen and pelvis 11/30/2008.  FINDINGS: Lower chest: Multifocal areas of mild scarring in the lung bases, most evident the medial segment of the right middle lobe. Atherosclerosis.  Hepatobiliary: Multifocal low-attenuation lesions scattered throughout the hepatic parenchyma, similar in size, number and distribution compared to prior study 11/30/2008. Although the smaller lesions are too small  to definitively characterize, given their stability compared to the prior examination, these are favored to represent cysts. The larger lesions are all compatible with simple cysts, including the largest 2.4 cm lesion in segment 6. In addition, in the right lobe of the liver in the inferior aspect of segment 7 (image 18 of series 2) there is a 2.0 x 2.4 cm hypervascular lesion which likely represents a flash fill cavernous hemangioma (slightly larger than prior study from 11/30/2008). Gallbladder is normal in appearance.  Pancreas: No pancreatic mass. No pancreatic ductal dilatation. No pancreatic or peripancreatic fluid or inflammatory changes.  Spleen: Unremarkable.  Adrenals/Urinary Tract: Bilateral adrenal glands are normal in appearance. Bilateral kidneys are normal in appearance. No hydroureteronephrosis. Urinary bladder is normal in appearance.  Stomach/Bowel: The appearance of the stomach is normal. No pathologic dilatation of small bowel or colon. Extensive colonic wall thickening extending from the level of the rectum to the splenic flexure. Mild cecal and  ascending colonic wall thickening. Hypervascularity of the rectal and colonic mucosa, particularly in the region of the rectum and sigmoid colon.  Vascular/Lymphatic: Atherosclerosis throughout the abdominal and pelvic vasculature, without evidence of aneurysm.  Reproductive: Prostate gland is markedly enlarged measuring 6.7 x 5.8 x 8.1 cm, with severe median lobe hypertrophy. Seminal vesicles are unremarkable in appearance.  Other: No significant volume of ascites.  No pneumoperitoneum.  Musculoskeletal: There are no aggressive appearing lytic or blastic lesions noted in the visualized portions of the skeleton. Status post ORIF in the left hip joint.  IMPRESSION: 1. Diffuse thickening of the colorectal wall, as above, concerning for colitis and proctitis. 2. Multiple hepatic lesions, similar to prior study 11/30/2008, favored to be benign, likely a combination of small cysts and biliary hamartomas, larger simple cysts, and flash fill cavernous hemangioma, as above. 3. Severe prostatomegaly with median lobe hypertrophy. 4. Additional incidental findings, as above.   Electronically Signed   By: Vinnie Langton M.D.   On: 02/25/2015 21:02     Assessment/Plan Active Problems:   HTN (hypertension)   Edema   Hypothyroidism   Colitis   1. Diffuse colitis involving the rectum - suspect C. difficile colitis since patient was on antibiotics 3 weeks ago. Stool for C. difficile has been ordered and patient has been empirically placed on vancomycin. Continue with gentle hydration. Hold Lasix. 2. Hypertension - we will continue present medications. 3. History of chronic lower extremity edema - hold Lasix secondary to dehydration and diarrhea. 4. Hypothyroidism - continue Synthroid. 5. Chronic anemia - follow CBC.   DVT Prophylaxis Lovenox.  Code Status: DO NOT RESUSCITATE. Family Communication: Discussed with patient.  Disposition Plan: Admit to inpatient.    Quasean Frye N. [facility] Pager [phone].  If 7PM-7AM, please contact night-coverage [email or web address] Password [facility] 02/25/2015, 11:55 PM

## 2015-02-25 NOTE — ED Notes (Signed)
Bed: KY70 Expected date:  Expected time:  Means of arrival:  Comments: EMS/diarrhea x 1 week

## 2015-02-25 NOTE — ED Provider Notes (Signed)
CSN: [id number]     Arrival date & time 02/25/15  1551 History   First MD Initiated Contact with Patient 02/25/15 1603     Chief Complaint  Patient presents with  . Diarrhea  . Weakness   HPI Pt had an episode of vomiting once last week.  He has been having frequent diarrhea.  If he takes a sip of water he will have a loose stool.   Maybe 5-6 episodes per day.  The stool has been brown.  No blood.  His hemorrhoids are irritated.  No abdominal pain .  No fever.  He was on some abx for a tooth ache 2 weeks ago.   Pt is not sure whey they sent him to the ED today.   He was seen in Dr Hervey Ard office 5 days ago.  He did have lab tests and a fecal occult blood test.  Past Medical History  Diagnosis Date  . Thyroid disease   . Hypertension   . BPH (benign prostatic hyperplasia)   . Fracture, intertrochanteric, left femur 12/17/2011  . Senile osteoporosis 08/25/2012  . Edema 04/28/2012  . Unspecified hearing loss 12/24/2011  . Cardiomegaly 12/24/2011  . Degeneration of thoracic or thoracolumbar intervertebral disc 12/24/2011  . Spinal stenosis, lumbar region, without neurogenic claudication 12/24/2011  . Other abnormal blood chemistry 12/24/2011  . Unspecified hypothyroidism 12/23/2011  . Acute posthemorrhagic anemia 12/23/2011   Past Surgical History  Procedure Laterality Date  . Foot surgery    . Compression hip screw  12/17/2011    Procedure: COMPRESSION HIP;  Surgeon: Johnny Bridge, MD;  Location: [facility];  Service: Orthopedics;  Laterality: Left;   Family History  Problem Relation Age of Onset  . Cancer Mother    History  Substance Use Topics  . Smoking status: Former Smoker    Types: Pipe    Quit date: 04/27/1985  . Smokeless tobacco: Never Used  . Alcohol Use: 0.6 oz/week    1 Glasses of wine per week    Review of Systems  Constitutional: Negative for fever.  Gastrointestinal: Negative for abdominal pain and blood in stool.  Genitourinary: Negative for dysuria.  Neurological: Negative  for syncope and weakness.  All other systems reviewed and are negative.     Allergies  Horse-derived products and Mold extract  Home Medications   Prior to Admission medications   Medication Sig Start Date End Date Taking? Authorizing Provider  aspirin 81 MG tablet Take 81 mg by mouth daily.   Yes Historical Provider, MD  B Complex Vitamins (VITAMIN B COMPLEX PO) Take 1 tablet by mouth daily.    Yes Historical Provider, MD  Cholecalciferol (VITAMIN D) 2000 UNITS tablet Take 2,000 Units by mouth daily.   Yes Historical Provider, MD  finasteride (PROSCAR) 5 MG tablet Take 5 mg by mouth daily.   Yes Historical Provider, MD  furosemide (LASIX) 20 MG tablet Take 10 mg by mouth daily.    Yes Historical Provider, MD  levothyroxine (SYNTHROID, LEVOTHROID) 137 MCG tablet Take one tablet by mouth 30 minutes before breakfast for thyroid. 07/02/14  Yes Estill Dooms, MD  losartan (COZAAR) 50 MG tablet TAKE 1/2 TABLET DAILY TO   STRENGTHEN THE HEART AND   CONTROL BLOOD PRESSURE 09/25/13  Yes Man Mast X, NP  Multiple Vitamin (MULTIVITAMIN) tablet Take 1 tablet by mouth daily.   Yes Historical Provider, MD  Omega-3 300 MG CAPS Take 1 capsule by mouth daily.    Yes Historical Provider, MD  Resveratrol 100 MG CAPS Take 1 capsule by mouth daily.    Yes Historical Provider, MD  Tamsulosin HCl (FLOMAX) 0.4 MG CAPS Take 0.4 mg by mouth daily.   Yes Historical Provider, MD  triamcinolone cream (KENALOG) 0.1 % Apply to  Lower legs twice daily 10/02/13  Yes Historical Provider, MD  vitamin C (ASCORBIC ACID) 500 MG tablet Take 500 mg by mouth daily.   Yes Historical Provider, MD  vitamin E (VITAMIN E) 400 UNIT capsule Take 400 Units by mouth daily.   Yes Historical Provider, MD  calcium-vitamin D (OSCAL WITH D) 500-200 MG-UNIT per tablet Take 1 tablet by mouth daily. 12/17/11 12/16/12  Marchia Bond, MD  denosumab (PROLIA) 60 MG/ML SOLN injection Inject 60 mg into the skin every 6 (six) months. Administer in upper arm,  thigh, or abdomen    Historical Provider, MD   BP 111/51 mmHg  Pulse 89  Temp(Src) 100.2 F (37.9 C) (Oral)  Resp 18  SpO2 100% Physical Exam  Constitutional: He appears well-developed and well-nourished. No distress.  HENT:  Head: Normocephalic and atraumatic.  Right Ear: External ear normal.  Left Ear: External ear normal.  Eyes: Conjunctivae are normal. Right eye exhibits no discharge. Left eye exhibits no discharge. No scleral icterus.  Neck: Neck supple. No tracheal deviation present.  Cardiovascular: Normal rate, regular rhythm and intact distal pulses.   Pulmonary/Chest: Effort normal and breath sounds normal. No stridor. No respiratory distress. He has no wheezes. He has no rales.  Abdominal: Soft. Bowel sounds are normal. He exhibits no distension. There is no tenderness. There is no rebound and no guarding.  Musculoskeletal: He exhibits no edema or tenderness.  Neurological: He is alert. He has normal strength. No cranial nerve deficit (no facial droop, extraocular movements intact, no slurred speech) or sensory deficit. He exhibits normal muscle tone. He displays no seizure activity. Coordination normal.  Skin: Skin is warm and dry. No rash noted.  Psychiatric: He has a normal mood and affect.  Nursing note and vitals reviewed.   ED Course  Procedures (including critical care time) Labs Review Labs Reviewed  CBC WITH DIFFERENTIAL/PLATELET - Abnormal; Notable for the following:    WBC 13.5 (*)    RBC 3.22 (*)    Hemoglobin 10.7 (*)    HCT 31.9 (*)    Neutrophils Relative % 83 (*)    Neutro Abs 11.1 (*)    Lymphocytes Relative 4 (*)    Lymphs Abs 0.6 (*)    Monocytes Relative 13 (*)    Monocytes Absolute 1.8 (*)    All other components within normal limits  COMPREHENSIVE METABOLIC PANEL - Abnormal; Notable for the following:    Glucose, Bld 120 (*)    BUN 25 (*)    Albumin 3.4 (*)    All other components within normal limits  CLOSTRIDIUM DIFFICILE BY PCR (NOT  AT [facility])  STOOL CULTURE  I-STAT CG4 LACTIC ACID, ED  I-STAT CG4 LACTIC ACID, ED    Imaging Review Ct Abdomen Pelvis W Contrast  02/25/2015   CLINICAL DATA:  81 year old male with persistent diarrhea for 1 week, progressively worsening, with increasing fatigue.  EXAM: CT ABDOMEN AND PELVIS WITH CONTRAST  TECHNIQUE: Multidetector CT imaging of the abdomen and pelvis was performed using the standard protocol following bolus administration of intravenous contrast.  CONTRAST:  14mL OMNIPAQUE IOHEXOL 300 MG/ML SOLN, 120mL OMNIPAQUE IOHEXOL 300 MG/ML SOLN  COMPARISON:  CT of the abdomen and pelvis 11/30/2008.  FINDINGS: Lower chest:  Multifocal areas of mild scarring in the lung bases, most evident the medial segment of the right middle lobe. Atherosclerosis.  Hepatobiliary: Multifocal low-attenuation lesions scattered throughout the hepatic parenchyma, similar in size, number and distribution compared to prior study 11/30/2008. Although the smaller lesions are too small to definitively characterize, given their stability compared to the prior examination, these are favored to represent cysts. The larger lesions are all compatible with simple cysts, including the largest 2.4 cm lesion in segment 6. In addition, in the right lobe of the liver in the inferior aspect of segment 7 (image 18 of series 2) there is a 2.0 x 2.4 cm hypervascular lesion which likely represents a flash fill cavernous hemangioma (slightly larger than prior study from 11/30/2008). Gallbladder is normal in appearance.  Pancreas: No pancreatic mass. No pancreatic ductal dilatation. No pancreatic or peripancreatic fluid or inflammatory changes.  Spleen: Unremarkable.  Adrenals/Urinary Tract: Bilateral adrenal glands are normal in appearance. Bilateral kidneys are normal in appearance. No hydroureteronephrosis. Urinary bladder is normal in appearance.  Stomach/Bowel: The appearance of the stomach is normal. No pathologic dilatation of small bowel or  colon. Extensive colonic wall thickening extending from the level of the rectum to the splenic flexure. Mild cecal and ascending colonic wall thickening. Hypervascularity of the rectal and colonic mucosa, particularly in the region of the rectum and sigmoid colon.  Vascular/Lymphatic: Atherosclerosis throughout the abdominal and pelvic vasculature, without evidence of aneurysm.  Reproductive: Prostate gland is markedly enlarged measuring 6.7 x 5.8 x 8.1 cm, with severe median lobe hypertrophy. Seminal vesicles are unremarkable in appearance.  Other: No significant volume of ascites.  No pneumoperitoneum.  Musculoskeletal: There are no aggressive appearing lytic or blastic lesions noted in the visualized portions of the skeleton. Status post ORIF in the left hip joint.  IMPRESSION: 1. Diffuse thickening of the colorectal wall, as above, concerning for colitis and proctitis. 2. Multiple hepatic lesions, similar to prior study 11/30/2008, favored to be benign, likely a combination of small cysts and biliary hamartomas, larger simple cysts, and flash fill cavernous hemangioma, as above. 3. Severe prostatomegaly with median lobe hypertrophy. 4. Additional incidental findings, as above.   Electronically Signed   By: Vinnie Langton M.D.   On: 02/25/2015 21:02    Medications  vancomycin (VANCOCIN) 50 mg/mL oral solution 125 mg (not administered)  sodium chloride 0.9 % bolus 500 mL (0 mLs Intravenous Stopped 02/25/15 2207)  iohexol (OMNIPAQUE) 300 MG/ML solution 50 mL (50 mLs Oral Contrast Given 02/25/15 1800)  iohexol (OMNIPAQUE) 300 MG/ML solution 100 mL (100 mLs Intravenous Contrast Given 02/25/15 2034)     MDM   Final diagnoses:  Colitis    Pt presents with persistent diarrhea.  CT scan is showing a colitis /proctitis.  Pt had been on abx recently.  Possibility of c diff colitis.  Will start on oral vancomycin.  Admit to the hospital for further treatment.    Dorie Rank, MD 02/25/15 2229

## 2015-02-26 DIAGNOSIS — A0472 Enterocolitis due to Clostridium difficile, not specified as recurrent: Secondary | ICD-10-CM | POA: Diagnosis present

## 2015-02-26 DIAGNOSIS — E038 Other specified hypothyroidism: Secondary | ICD-10-CM

## 2015-02-26 LAB — COMPREHENSIVE METABOLIC PANEL
ALT: 15 U/L — ABNORMAL LOW (ref 17–63)
AST: 18 U/L (ref 15–41)
Albumin: 2.8 g/dL — ABNORMAL LOW (ref 3.5–5.0)
Alkaline Phosphatase: 38 U/L (ref 38–126)
Anion gap: 6 (ref 5–15)
BUN: 22 mg/dL — ABNORMAL HIGH (ref 6–20)
CO2: 27 mmol/L (ref 22–32)
Calcium: 8 mg/dL — ABNORMAL LOW (ref 8.9–10.3)
Chloride: 98 mmol/L — ABNORMAL LOW (ref 101–111)
Creatinine, Ser: 0.97 mg/dL (ref 0.61–1.24)
GFR calc Af Amer: >60 mL/min (ref >60)
GFR calc non Af Amer: >60 mL/min (ref >60)
Glucose, Bld: 97 mg/dL (ref 65–99)
Potassium: 3.5 mmol/L (ref 3.5–5.1)
Sodium: 131 mmol/L — ABNORMAL LOW (ref 135–145)
Total Bilirubin: 0.7 mg/dL (ref 0.3–1.2)
Total Protein: 5.3 g/dL — ABNORMAL LOW (ref 6.5–8.1)

## 2015-02-26 LAB — CBC WITH DIFFERENTIAL/PLATELET
Basophils Absolute: 0 10*3/uL (ref 0.0–0.1)
Basophils Relative: 0 % (ref 0–1)
Eosinophils Absolute: 0.1 10*3/uL (ref 0.0–0.7)
Eosinophils Relative: 1 % (ref 0–5)
HCT: 27.8 % — ABNORMAL LOW (ref 39.0–52.0)
Hemoglobin: 9.4 g/dL — ABNORMAL LOW (ref 13.0–17.0)
Lymphocytes Relative: 15 % (ref 12–46)
Lymphs Abs: 1.4 10*3/uL (ref 0.7–4.0)
MCH: 33.3 pg (ref 26.0–34.0)
MCHC: 33.8 g/dL (ref 30.0–36.0)
MCV: 98.6 fL (ref 78.0–100.0)
Monocytes Absolute: 1.1 10*3/uL — ABNORMAL HIGH (ref 0.1–1.0)
Monocytes Relative: 12 % (ref 3–12)
Neutro Abs: 6.5 10*3/uL (ref 1.7–7.7)
Neutrophils Relative %: 72 % (ref 43–77)
Platelets: 205 10*3/uL (ref 150–400)
RBC: 2.82 MIL/uL — ABNORMAL LOW (ref 4.22–5.81)
RDW: 12.8 % (ref 11.5–15.5)
WBC: 9.1 10*3/uL (ref 4.0–10.5)

## 2015-02-26 LAB — CLOSTRIDIUM DIFFICILE BY PCR: Toxigenic C. Difficile by PCR: POSITIVE — AB

## 2015-02-26 MED ORDER — ASPIRIN EC 81 MG PO TBEC
81.0000 mg | DELAYED_RELEASE_TABLET | Freq: Every day | ORAL | Status: DC
  Administered 2015-02-26 – 2015-02-27 (×2): 81 mg via ORAL
  Filled 2015-02-26 (×2): qty 1

## 2015-02-26 MED ORDER — OCUVITE-LUTEIN PO CAPS
1.0000 | ORAL_CAPSULE | Freq: Every day | ORAL | Status: DC
  Administered 2015-02-26 – 2015-02-27 (×2): 1 via ORAL
  Filled 2015-02-26 (×2): qty 1

## 2015-02-26 MED ORDER — ENOXAPARIN SODIUM 40 MG/0.4ML ~~LOC~~ SOLN
40.0000 mg | SUBCUTANEOUS | Status: DC
  Administered 2015-02-26 – 2015-02-27 (×2): 40 mg via SUBCUTANEOUS
  Filled 2015-02-26 (×2): qty 0.4

## 2015-02-26 MED ORDER — ACETAMINOPHEN 650 MG RE SUPP: 650.0000 mg | Freq: Four times a day (QID) | RECTAL | Status: DC | PRN

## 2015-02-26 MED ORDER — ACETAMINOPHEN 325 MG PO TABS: 650.0000 mg | ORAL_TABLET | Freq: Four times a day (QID) | ORAL | Status: DC | PRN

## 2015-02-26 MED ORDER — ONDANSETRON HCL 4 MG/2ML IJ SOLN: 4.0000 mg | Freq: Four times a day (QID) | INTRAMUSCULAR | Status: DC | PRN

## 2015-02-26 MED ORDER — ONDANSETRON HCL 4 MG PO TABS: 4.0000 mg | ORAL_TABLET | Freq: Four times a day (QID) | ORAL | Status: DC | PRN

## 2015-02-26 MED ORDER — TAMSULOSIN HCL 0.4 MG PO CAPS
0.4000 mg | ORAL_CAPSULE | Freq: Every day | ORAL | Status: DC
  Administered 2015-02-26 – 2015-02-27 (×2): 0.4 mg via ORAL
  Filled 2015-02-26 (×2): qty 1

## 2015-02-26 MED ORDER — ENSURE ENLIVE PO LIQD
237.0000 mL | Freq: Two times a day (BID) | ORAL | Status: DC
  Administered 2015-02-26 – 2015-02-27 (×4): 237 mL via ORAL

## 2015-02-26 MED ORDER — LEVOTHYROXINE SODIUM 137 MCG PO TABS
137.0000 ug | ORAL_TABLET | Freq: Every day | ORAL | Status: DC
  Administered 2015-02-26 – 2015-02-27 (×2): 137 ug via ORAL
  Filled 2015-02-26 (×3): qty 1

## 2015-02-26 MED ORDER — LOSARTAN POTASSIUM 50 MG PO TABS
50.0000 mg | ORAL_TABLET | Freq: Every day | ORAL | Status: DC
  Administered 2015-02-27: 50 mg via ORAL
  Filled 2015-02-26 (×2): qty 1

## 2015-02-26 MED ORDER — SODIUM CHLORIDE 0.9 % IJ SOLN
3.0000 mL | Freq: Two times a day (BID) | INTRAMUSCULAR | Status: DC
  Administered 2015-02-26 (×3): 3 mL via INTRAVENOUS

## 2015-02-26 MED ORDER — FINASTERIDE 5 MG PO TABS
5.0000 mg | ORAL_TABLET | Freq: Every day | ORAL | Status: DC
  Administered 2015-02-26 – 2015-02-27 (×2): 5 mg via ORAL
  Filled 2015-02-26 (×2): qty 1

## 2015-02-26 NOTE — Progress Notes (Signed)
Pt is from [place]. CSW is not following at this time. CSW is available to assist with d/c planning if it is determined pt requires a higher level of care following hospital d/c.  Werner Lean LCSW [phone]

## 2015-02-26 NOTE — Progress Notes (Signed)
[facility] PROGRESS NOTE  Cory Burton IRC:[id number] DOB: [birth year] DOA: 02/25/2015 PCP: Estill Dooms, MD  Assessment/Plan: 1. Cdiff colitis present on admission 1. Pt is continued on PO vanc 2. Clinically improved 3. Will continue supportive care 2. HTN 1. BP stable and controlled 2. Cont current regimen 3. Chronic LE edema 1. Stable 4. Hypothyroid 1. Cont on thyroid replacement 2. Stable 5. Chronic anemia 1. Hgb stable 2. Cont to monitor CBC 6. DVT prophylaxis 1. Lovenox subQ  Code Status: DNR Family Communication: Pt in room (indicate person spoken with, relationship, and if by phone, the number) Disposition Plan: Pending   Consultants:    Procedures:    Antibiotics:  PO vancomycin 7/13>>>   HPI/Subjective: Feels better today. No complaints  Objective: Filed Vitals:   02/26/15 0030 02/26/15 0613 02/26/15 1000 02/26/15 1400  BP: 112/47 99/37 97/44  119/53  Pulse: 73 65  73  Temp: 98.3 F (36.8 C) 98.7 F (37.1 C)  97.8 F (36.6 C)  TempSrc: Oral Oral  Oral  Resp: 18 16  18   Height:      Weight:      SpO2: 95% 97%  97%    Intake/Output Summary (Last 24 hours) at 02/26/15 1505 Last data filed at 02/26/15 0950  Gross per 24 hour  Intake    243 ml  Output      0 ml  Net    243 ml   Filed Weights   02/26/15 0020  Weight: 59.285 kg (130 lb 11.2 oz)    Exam:   General:  Awake, in nad  Cardiovascular: regular, s1, s2  Respiratory: normal resp effort, no wheezing  Abdomen: soft,nondistended, pos BS  Musculoskeletal: perfused, no clubbing   Data Reviewed: Basic Metabolic Panel:  Recent Labs Lab 02/20/15 1241 02/25/15 1640 02/26/15 0513  NA 134 135 131*  K 4.4 3.6 3.5  CL 97 101 98*  CO2 24 25 27   GLUCOSE 110* 120* 97  BUN 35 25* 22*  CREATININE 1.21 0.99 0.97  CALCIUM 9.7 9.3 8.0*   Liver Function Tests:  Recent Labs Lab 02/20/15 1241 02/25/15 1640 02/26/15 0513  AST 28 27 18   ALT 14 18 15*  ALKPHOS 39 46  38  BILITOT 0.4 0.7 0.7  PROT 6.0 6.6 5.3*  ALBUMIN  --  3.4* 2.8*   No results for input(s): LIPASE, AMYLASE in the last 168 hours. No results for input(s): AMMONIA in the last 168 hours. CBC:  Recent Labs Lab 02/20/15 1241 02/25/15 1640 02/26/15 0513  WBC 12.2* 13.5* 9.1  NEUTROABS 10.6* 11.1* 6.5  HGB  --  10.7* 9.4*  HCT 31.8* 31.9* 27.8*  MCV  --  99.1 98.6  PLT  --  236 205   Cardiac Enzymes: No results for input(s): CKTOTAL, CKMB, CKMBINDEX, TROPONINI in the last 168 hours. BNP (last 3 results) No results for input(s): BNP in the last 8760 hours.  ProBNP (last 3 results) No results for input(s): PROBNP in the last 8760 hours.  CBG: No results for input(s): GLUCAP in the last 168 hours.  Recent Results (from the past 240 hour(s))  Urine culture     Status: None   Collection Time: 02/20/15 12:47 PM  Result Value Ref Range Status   Urine Culture, Routine Final report  Final   Result 1 No growth  Final  Clostridium Difficile by PCR (not at [facility])     Status: Abnormal   Collection Time: 02/25/15  9:35 PM  Result Value Ref  Range Status   C difficile by pcr POSITIVE (A) NEGATIVE Final    Comment: CRITICAL RESULT CALLED TO, READ BACK BY AND VERIFIED WITH: Roxanna Mew RN AT 0207 ON 07.12.16 BY G KONTOS      Studies: Ct Abdomen Pelvis W Contrast  02/25/2015   CLINICAL DATA:  81 year old male with persistent diarrhea for 1 week, progressively worsening, with increasing fatigue.  EXAM: CT ABDOMEN AND PELVIS WITH CONTRAST  TECHNIQUE: Multidetector CT imaging of the abdomen and pelvis was performed using the standard protocol following bolus administration of intravenous contrast.  CONTRAST:  45mL OMNIPAQUE IOHEXOL 300 MG/ML SOLN, 1102mL OMNIPAQUE IOHEXOL 300 MG/ML SOLN  COMPARISON:  CT of the abdomen and pelvis 11/30/2008.  FINDINGS: Lower chest: Multifocal areas of mild scarring in the lung bases, most evident the medial segment of the right middle lobe. Atherosclerosis.   Hepatobiliary: Multifocal low-attenuation lesions scattered throughout the hepatic parenchyma, similar in size, number and distribution compared to prior study 11/30/2008. Although the smaller lesions are too small to definitively characterize, given their stability compared to the prior examination, these are favored to represent cysts. The larger lesions are all compatible with simple cysts, including the largest 2.4 cm lesion in segment 6. In addition, in the right lobe of the liver in the inferior aspect of segment 7 (image 18 of series 2) there is a 2.0 x 2.4 cm hypervascular lesion which likely represents a flash fill cavernous hemangioma (slightly larger than prior study from 11/30/2008). Gallbladder is normal in appearance.  Pancreas: No pancreatic mass. No pancreatic ductal dilatation. No pancreatic or peripancreatic fluid or inflammatory changes.  Spleen: Unremarkable.  Adrenals/Urinary Tract: Bilateral adrenal glands are normal in appearance. Bilateral kidneys are normal in appearance. No hydroureteronephrosis. Urinary bladder is normal in appearance.  Stomach/Bowel: The appearance of the stomach is normal. No pathologic dilatation of small bowel or colon. Extensive colonic wall thickening extending from the level of the rectum to the splenic flexure. Mild cecal and ascending colonic wall thickening. Hypervascularity of the rectal and colonic mucosa, particularly in the region of the rectum and sigmoid colon.  Vascular/Lymphatic: Atherosclerosis throughout the abdominal and pelvic vasculature, without evidence of aneurysm.  Reproductive: Prostate gland is markedly enlarged measuring 6.7 x 5.8 x 8.1 cm, with severe median lobe hypertrophy. Seminal vesicles are unremarkable in appearance.  Other: No significant volume of ascites.  No pneumoperitoneum.  Musculoskeletal: There are no aggressive appearing lytic or blastic lesions noted in the visualized portions of the skeleton. Status post ORIF in the left  hip joint.  IMPRESSION: 1. Diffuse thickening of the colorectal wall, as above, concerning for colitis and proctitis. 2. Multiple hepatic lesions, similar to prior study 11/30/2008, favored to be benign, likely a combination of small cysts and biliary hamartomas, larger simple cysts, and flash fill cavernous hemangioma, as above. 3. Severe prostatomegaly with median lobe hypertrophy. 4. Additional incidental findings, as above.   Electronically Signed   By: Vinnie Langton M.D.   On: 02/25/2015 21:02    Scheduled Meds: . aspirin EC  81 mg Oral Daily  . enoxaparin (LOVENOX) injection  40 mg Subcutaneous Q24H  . feeding supplement (ENSURE ENLIVE)  237 mL Oral BID BM  . finasteride  5 mg Oral Daily  . levothyroxine  137 mcg Oral QAC breakfast  . losartan  50 mg Oral Daily  . multivitamin-lutein  1 capsule Oral Daily  . sodium chloride  3 mL Intravenous Q12H  . tamsulosin  0.4 mg Oral Daily  .  vancomycin  125 mg Oral QID   Continuous Infusions:   Active Problems:   HTN (hypertension)   Edema   Hypothyroidism   Colitis    CHIU, STEPHEN K  [facility] Pager [phone]. If 7PM-7AM, please contact night-coverage at [email or web address], password [facility] 02/26/2015, 3:05 PM  LOS: 1 day

## 2015-02-27 DIAGNOSIS — E876 Hypokalemia: Secondary | ICD-10-CM

## 2015-02-27 DIAGNOSIS — A047 Enterocolitis due to Clostridium difficile: Principal | ICD-10-CM

## 2015-02-27 DIAGNOSIS — I10 Essential (primary) hypertension: Secondary | ICD-10-CM

## 2015-02-27 LAB — BASIC METABOLIC PANEL
Anion gap: 5 (ref 5–15)
BUN: 20 mg/dL (ref 6–20)
CO2: 27 mmol/L (ref 22–32)
Calcium: 8.4 mg/dL — ABNORMAL LOW (ref 8.9–10.3)
Chloride: 102 mmol/L (ref 101–111)
Creatinine, Ser: 0.93 mg/dL (ref 0.61–1.24)
GFR calc Af Amer: >60 mL/min (ref >60)
GFR calc non Af Amer: >60 mL/min (ref >60)
Glucose, Bld: 101 mg/dL — ABNORMAL HIGH (ref 65–99)
Potassium: 3.3 mmol/L — ABNORMAL LOW (ref 3.5–5.1)
Sodium: 134 mmol/L — ABNORMAL LOW (ref 135–145)

## 2015-02-27 LAB — CBC
HCT: 28.5 % — ABNORMAL LOW (ref 39.0–52.0)
Hemoglobin: 9.6 g/dL — ABNORMAL LOW (ref 13.0–17.0)
MCH: 33.7 pg (ref 26.0–34.0)
MCHC: 33.7 g/dL (ref 30.0–36.0)
MCV: 100 fL (ref 78.0–100.0)
Platelets: 247 10*3/uL (ref 150–400)
RBC: 2.85 MIL/uL — ABNORMAL LOW (ref 4.22–5.81)
RDW: 12.7 % (ref 11.5–15.5)
WBC: 7.3 10*3/uL (ref 4.0–10.5)

## 2015-02-27 MED ORDER — ENSURE ENLIVE PO LIQD: 237.0000 mL | Freq: Two times a day (BID) | ORAL | Status: AC

## 2015-02-27 MED ORDER — VANCOMYCIN 50 MG/ML ORAL SOLUTION: 125.0000 mg | Freq: Four times a day (QID) | ORAL | Status: AC

## 2015-02-27 MED ORDER — POTASSIUM CHLORIDE CRYS ER 20 MEQ PO TBCR
40.0000 meq | EXTENDED_RELEASE_TABLET | Freq: Once | ORAL | Status: AC
  Administered 2015-02-27: 40 meq via ORAL
  Filled 2015-02-27: qty 2

## 2015-02-27 NOTE — Care Management Important Message (Signed)
Important Message  Patient Details Amanda/RN presented patient IM letterImportant Message  Patient Details  Name: Cory Burton MRN: [id number] Date of Birth: 1918/05/15   Medicare Important Message Given:  Yes-second notification given    Camillo Flaming 02/27/2015, 10:59 AM Name: Cory Burton MRN: [id number] Date of Birth: 1918-03-10   Medicare Important Message Given:  Yes-second notification given    Camillo Flaming 02/27/2015, 10:58 AM

## 2015-02-27 NOTE — Discharge Summary (Signed)
Physician Discharge Summary  Cory Burton FGH:[id number] DOB: 1917-09-10 DOA: 02/25/2015  PCP: Estill Dooms, MD  Admit date: 02/25/2015 Discharge date: 02/27/2015  Time spent: 35 minutes  Recommendations for Outpatient Follow-up:  1. Discharge home with oral vancomycin to be completed on 7/25. 2. Follow-up with PCP in 1 week. Please monitor potassium.  Discharge Diagnoses:  Principal Problem:   C. difficile colitis   Active Problems:   HTN (hypertension)   Edema   Hypothyroidism    Discharge Condition: Fair  Diet recommendation: Regular with protein supplement  Filed Weights   02/26/15 0020  Weight: 59.285 kg (130 lb 11.2 oz)    History of present illness:  Please refer to admission H&P for details, in brief, 81 year old male with history of hypertension, lower extremity edema, chronic anemia and hypothyroidism presented to the ED with persistent diarrhea and abdominal discomfort for one week. Patient reports being on antibiotic for a tooth infection 3 weeks back. Patient had multiple episodes of watery diarrhea with  one episode of nausea and vomiting along with generalized abdominal discomfort. In the ED CT of the abdomen and pelvis showed diffuse colitis. Stool for C. difficile was negative.  Hospital Course:  C. difficile colitis Patient placed on empiric oral vancomycin. Abdominal discomfort and diarrhea improved. (Has had only one episode of loose bowel movement since last evening). Patient afebrile and leukocytosis resolved. He is clinically stable to be discharged home blood pressure medications.    Hypokalemia Replenished. Follow-up as outpatient.  Hypothyroidism Continue Synthroid    Anemia of chronic disease Hemoglobin stable.   Patient clinically stable for discharge home with outpatient follow-up with PCP.  Procedures:  CT abdomen and pelvis  Consultations:  none  Discharge Exam: Filed Vitals:   02/27/15 0617  BP: 128/45  Pulse: 62   Temp: 97.9 F (36.6 C)  Resp: 14    General:  elderly pleasant male in no acute distress HEENT: No pallor, moist oral mucosa, supple neck Chest: Clear to auscultation bilaterally, no added sounds CVS: Normal S1 and S2, no murmurs rub or gallop GI: Soft, nondistended, nontender, bowel sounds present Musculoskeletal: Warm, no edema CNS: Alert and oriented   Discharge Instructions    Current Discharge Medication List    START taking these medications   Details  feeding supplement, ENSURE ENLIVE, (ENSURE ENLIVE) LIQD Take 237 mLs by mouth 2 (two) times daily between meals. Qty: 237 mL, Refills: 6    vancomycin (VANCOCIN) 50 mg/mL oral solution Take 2.5 mLs (125 mg total) by mouth 4 (four) times daily. Qty: 130 mL, Refills: 0      CONTINUE these medications which have NOT CHANGED   Details  aspirin 81 MG tablet Take 81 mg by mouth daily.    B Complex Vitamins (VITAMIN B COMPLEX PO) Take 1 tablet by mouth daily.     Cholecalciferol (VITAMIN D) 2000 UNITS tablet Take 2,000 Units by mouth daily.    finasteride (PROSCAR) 5 MG tablet Take 5 mg by mouth daily.    furosemide (LASIX) 20 MG tablet Take 10 mg by mouth daily.     levothyroxine (SYNTHROID, LEVOTHROID) 137 MCG tablet Take one tablet by mouth 30 minutes before breakfast for thyroid. Qty: 90 tablet, Refills: 3    losartan (COZAAR) 50 MG tablet TAKE 1/2 TABLET DAILY TO   STRENGTHEN THE HEART AND   CONTROL BLOOD PRESSURE Qty: 45 tablet, Refills: 1    Multiple Vitamin (MULTIVITAMIN) tablet Take 1 tablet by mouth daily.    Omega-3  300 MG CAPS Take 1 capsule by mouth daily.     Resveratrol 100 MG CAPS Take 1 capsule by mouth daily.     Tamsulosin HCl (FLOMAX) 0.4 MG CAPS Take 0.4 mg by mouth daily.    triamcinolone cream (KENALOG) 0.1 % Apply to  Lower legs twice daily    vitamin C (ASCORBIC ACID) 500 MG tablet Take 500 mg by mouth daily.    vitamin E (VITAMIN E) 400 UNIT capsule Take 400 Units by mouth daily.     denosumab (PROLIA) 60 MG/ML SOLN injection Inject 60 mg into the skin every 6 (six) months. Administer in upper arm, thigh, or abdomen      STOP taking these medications     calcium-vitamin D (OSCAL WITH D) 500-200 MG-UNIT per tablet        Allergies  Allergen Reactions  . Horse-Derived Products     Horse serum  . Mold Extract [Trichophyton]    Follow-up Information    Follow up with GREEN, Viviann Spare, MD In 1 week.   Specialty:  Internal Medicine   Contact information:   [place] [id number] [phone]        The results of significant diagnostics from this hospitalization (including imaging, microbiology, ancillary and laboratory) are listed below for reference.    Significant Diagnostic Studies: Ct Abdomen Pelvis W Contrast  02/25/2015   CLINICAL DATA:  81 year old male with persistent diarrhea for 1 week, progressively worsening, with increasing fatigue.  EXAM: CT ABDOMEN AND PELVIS WITH CONTRAST  TECHNIQUE: Multidetector CT imaging of the abdomen and pelvis was performed using the standard protocol following bolus administration of intravenous contrast.  CONTRAST:  62mL OMNIPAQUE IOHEXOL 300 MG/ML SOLN, 13mL OMNIPAQUE IOHEXOL 300 MG/ML SOLN  COMPARISON:  CT of the abdomen and pelvis 11/30/2008.  FINDINGS: Lower chest: Multifocal areas of mild scarring in the lung bases, most evident the medial segment of the right middle lobe. Atherosclerosis.  Hepatobiliary: Multifocal low-attenuation lesions scattered throughout the hepatic parenchyma, similar in size, number and distribution compared to prior study 11/30/2008. Although the smaller lesions are too small to definitively characterize, given their stability compared to the prior examination, these are favored to represent cysts. The larger lesions are all compatible with simple cysts, including the largest 2.4 cm lesion in segment 6. In addition, in the right lobe of the liver in the inferior aspect of segment  7 (image 18 of series 2) there is a 2.0 x 2.4 cm hypervascular lesion which likely represents a flash fill cavernous hemangioma (slightly larger than prior study from 11/30/2008). Gallbladder is normal in appearance.  Pancreas: No pancreatic mass. No pancreatic ductal dilatation. No pancreatic or peripancreatic fluid or inflammatory changes.  Spleen: Unremarkable.  Adrenals/Urinary Tract: Bilateral adrenal glands are normal in appearance. Bilateral kidneys are normal in appearance. No hydroureteronephrosis. Urinary bladder is normal in appearance.  Stomach/Bowel: The appearance of the stomach is normal. No pathologic dilatation of small bowel or colon. Extensive colonic wall thickening extending from the level of the rectum to the splenic flexure. Mild cecal and ascending colonic wall thickening. Hypervascularity of the rectal and colonic mucosa, particularly in the region of the rectum and sigmoid colon.  Vascular/Lymphatic: Atherosclerosis throughout the abdominal and pelvic vasculature, without evidence of aneurysm.  Reproductive: Prostate gland is markedly enlarged measuring 6.7 x 5.8 x 8.1 cm, with severe median lobe hypertrophy. Seminal vesicles are unremarkable in appearance.  Other: No significant volume of ascites.  No pneumoperitoneum.  Musculoskeletal: There  are no aggressive appearing lytic or blastic lesions noted in the visualized portions of the skeleton. Status post ORIF in the left hip joint.  IMPRESSION: 1. Diffuse thickening of the colorectal wall, as above, concerning for colitis and proctitis. 2. Multiple hepatic lesions, similar to prior study 11/30/2008, favored to be benign, likely a combination of small cysts and biliary hamartomas, larger simple cysts, and flash fill cavernous hemangioma, as above. 3. Severe prostatomegaly with median lobe hypertrophy. 4. Additional incidental findings, as above.   Electronically Signed   By: Vinnie Langton M.D.   On: 02/25/2015 21:02     Microbiology: Recent Results (from the past 240 hour(s))  Urine culture     Status: None   Collection Time: 02/20/15 12:47 PM  Result Value Ref Range Status   Urine Culture, Routine Final report  Final   Result 1 No growth  Final  Clostridium Difficile by PCR (not at [facility])     Status: Abnormal   Collection Time: 02/25/15  9:35 PM  Result Value Ref Range Status   C difficile by pcr POSITIVE (A) NEGATIVE Final    Comment: CRITICAL RESULT CALLED TO, READ BACK BY AND VERIFIED WITH: Roxanna Mew RN AT 0207 ON 07.12.16 BY G KONTOS      Labs: Basic Metabolic Panel:  Recent Labs Lab 02/20/15 1241 02/25/15 1640 02/26/15 0513 02/27/15 0410  NA 134 135 131* 134*  K 4.4 3.6 3.5 3.3*  CL 97 101 98* 102  CO2 24 25 27 27   GLUCOSE 110* 120* 97 101*  BUN 35 25* 22* 20  CREATININE 1.21 0.99 0.97 0.93  CALCIUM 9.7 9.3 8.0* 8.4*   Liver Function Tests:  Recent Labs Lab 02/20/15 1241 02/25/15 1640 02/26/15 0513  AST 28 27 18   ALT 14 18 15*  ALKPHOS 39 46 38  BILITOT 0.4 0.7 0.7  PROT 6.0 6.6 5.3*  ALBUMIN  --  3.4* 2.8*   No results for input(s): LIPASE, AMYLASE in the last 168 hours. No results for input(s): AMMONIA in the last 168 hours. CBC:  Recent Labs Lab 02/20/15 1241 02/25/15 1640 02/26/15 0513 02/27/15 0410  WBC 12.2* 13.5* 9.1 7.3  NEUTROABS 10.6* 11.1* 6.5  --   HGB  --  10.7* 9.4* 9.6*  HCT 31.8* 31.9* 27.8* 28.5*  MCV  --  99.1 98.6 100.0  PLT  --  236 205 247   Cardiac Enzymes: No results for input(s): CKTOTAL, CKMB, CKMBINDEX, TROPONINI in the last 168 hours. BNP: BNP (last 3 results) No results for input(s): BNP in the last 8760 hours.  ProBNP (last 3 results) No results for input(s): PROBNP in the last 8760 hours.  CBG: No results for input(s): GLUCAP in the last 168 hours.     SignedLouellen Molder  [facility] 02/27/2015, 11:19 AM

## 2015-02-27 NOTE — Progress Notes (Signed)
Initial Nutrition Assessment  DOCUMENTATION CODES:   Non-severe (moderate) malnutrition in context of chronic illness  INTERVENTION:   Continue Ensure Enlive po BID, each supplement provides 350 kcal and 20 grams of protein Encourage PO intake  NUTRITION DIAGNOSIS:   Malnutrition related to chronic illness ,advanced age as evidenced by moderate depletion of body fat, severe depletion of muscle mass.  GOAL:   Patient will meet greater than or equal to 90% of their needs  MONITOR:   PO intake, Supplement acceptance, Labs, Weight trends, Skin, I & O's  REASON FOR ASSESSMENT:   Malnutrition Screening Tool    ASSESSMENT:   81 y.o. male with a history of hypertension, lower extremity edema and hypothyroidism and chronic anemia presents to the ER because of persistent diarrhea and abdominal discomfort. Patient has been on antibiotics for tooth infection 3 weeks ago. Patient started experiencing diarrhea 1 week ago. Patient states he had multiple episodes of diarrhea and one episode of nausea and vomiting.   Pt reports no appetite changes PTA. PO intake: 0-100%, variable. Pt reports eating well this morning, ate pancakes and a cinnamon bagel.  Pt with minimal weight loss, 5% x 4 months, insignificant for time frame.  Nutrition-Focused physical exam completed. Findings are moderate fat depletion, severe muscle depletion, and no edema.   Labs reviewed: Low Na, K  Diet Order:  Diet Heart Room service appropriate?: Yes; Fluid consistency:: Thin  Skin:  Reviewed, no issues  Last BM:  7/13  Height:   Ht Readings from Last 1 Encounters:  02/26/15 5\' 7"  (1.702 m)    Weight:   Wt Readings from Last 1 Encounters:  02/26/15 130 lb 11.2 oz (59.285 kg)    Ideal Body Weight:  67.3 kg  Wt Readings from Last 10 Encounters:  02/26/15 130 lb 11.2 oz (59.285 kg)  02/20/15 133 lb 3.2 oz (60.419 kg)  10/25/14 137 lb (62.143 kg)  04/26/14 138 lb (62.596 kg)  10/26/13 133 lb  (60.328 kg)  04/27/13 144 lb (65.318 kg)  12/16/11 140 lb (63.504 kg)    BMI:  Body mass index is 20.47 kg/(m^2).  Estimated Nutritional Needs:   Kcal:  1300-1500  Protein:  70-80g  Fluid:  1.5L/day  EDUCATION NEEDS:   No education needs identified at this time  Clayton Bibles, MS, RD, LDN Pager: [phone] After Hours Pager: [phone]

## 2015-02-27 NOTE — Progress Notes (Signed)
Assessment unchanged.  Scripts were given per MD order.  All questions pertaining to D/C we answered.  Pt was D/C'd via wheelchair and accompanied by NT.  Shambria Camerer L  

## 2015-02-27 NOTE — Discharge Instructions (Signed)
[facility] Stay Proper nutrition can help your body recover from illness and injury.   Foods and beverages high in protein, vitamins, and minerals help rebuild muscle loss, promote healing, & reduce fall risk.   In addition to eating healthy foods, a nutrition shake is an easy, delicious way to get the nutrition you need during and after your hospital stay  It is recommended that you continue to drink 2 bottles per day of:       Ensure Enlive for at least 1 month (30 days) after your hospital stay   Tips for adding a nutrition shake into your routine: As allowed, drink one with vitamins or medications instead of water or juice Enjoy one as a tasty mid-morning or afternoon snack Drink cold or make a milkshake out of it Drink one instead of milk with cereal or snacks Use as a coffee creamer   Available at the following grocery stores and pharmacies:           * [place] [phone]            For COUPONS visit: www.ensure.com/join or http://dawson-may.com/   Suggested Substitutions Ensure Plus = Boost Plus = Carnation Breakfast Essentials = Boost Compact Ensure Active Clear = Boost Breeze Glucerna Shake = Boost Glucose Control = Carnation Breakfast Essentials SUGAR FREE       Clostridium Difficile Infection Clostridium difficile (C. difficile) is a germ found in the intestines. C. difficile infection can occur after taking some medicines. C. difficile infection can cause watery poop (diarrhea) or severe disease. HOME CARE  Drink enough fluids to keep your pee (urine) clear or pale yellow. Avoid milk, caffeine, and alcohol.  Ask your doctor how to replace body fluid losses (rehydrate).  Eat small meals more often rather than large meals.  Take your medicine (antibiotics) as told. Finish it even  if you start to feel better.  Do not  use medicines to slow the watery poop.  Wash your hands well after using the bathroom and before preparing food.  Make sure people who live with you wash their hands often.  Clean all surfaces. Use a product that contains chlorine bleach. GET HELP RIGHT AWAY IF:   The watery poop does not stop, or it comes back after you finish your medicine.  You feel very dry or thirsty (dehydrated).  You have a fever.  You have more belly (abdominal) pain or tenderness.  There is blood in your poop (stool), or your poop is black and tar-like.  You cannot eat food or drink liquids without throwing up (vomiting). MAKE SURE YOU:  Understand these instructions.  Will watch your condition.  Will get help right away if you are not doing well or get worse. Document Released: 05/31/2009 Document Revised: 12/18/2013 Document Reviewed: 01/09/2011 [facility] Patient Information 2015 [place], [place]. This information is not intended to replace advice given to you by your health care provider. Make sure you discuss any questions you have with your health care provider.

## 2015-03-01 LAB — STOOL CULTURE

## 2015-03-05 ENCOUNTER — Ambulatory Visit: Payer: Medicare Other | Admitting: Internal Medicine

## 2015-03-06 ENCOUNTER — Ambulatory Visit (INDEPENDENT_AMBULATORY_CARE_PROVIDER_SITE_OTHER): Payer: Medicare Other | Admitting: Internal Medicine

## 2015-03-06 ENCOUNTER — Encounter: Payer: Self-pay | Admitting: Internal Medicine

## 2015-03-06 VITALS — BP 108/62 | HR 88 | Temp 98.4°F | Resp 20 | Ht 67.0 in | Wt 129.2 lb

## 2015-03-06 DIAGNOSIS — A047 Enterocolitis due to Clostridium difficile: Secondary | ICD-10-CM | POA: Diagnosis not present

## 2015-03-06 DIAGNOSIS — I10 Essential (primary) hypertension: Secondary | ICD-10-CM

## 2015-03-06 DIAGNOSIS — A0472 Enterocolitis due to Clostridium difficile, not specified as recurrent: Secondary | ICD-10-CM

## 2015-03-06 DIAGNOSIS — D649 Anemia, unspecified: Secondary | ICD-10-CM

## 2015-03-06 DIAGNOSIS — E876 Hypokalemia: Secondary | ICD-10-CM | POA: Diagnosis not present

## 2015-03-06 HISTORY — DX: Anemia, unspecified: D64.9

## 2015-03-06 NOTE — Progress Notes (Signed)
Patient ID: Cory Burton, male   DOB: 12-01-1917, 81 y.o.   MRN: [id number]    Facility  [facility]    Place of Service:   OFFICE    Allergies  Allergen Reactions  . Horse-Derived Products     Horse serum  . Mold Extract [Trichophyton]     Chief Complaint  Patient presents with  . Acute Visit    follow-up from hospital     HPI:  Posthospitalization transition of care visit. Hospitalized 02/25/2015 through 02/27/2015 for C. difficile colitis. Treatment was initiated with vancomycin orally. He is tolerating this well. Stools are now formed. Appetite is returning and he has no abdominal cramping.  Blood pressure is well controlled.   Patient was noted to be anemic in the hospital and will need further follow-up for this.  Hospital labs showed a low potassium at 3.3 mEq per liter.  Medications: Patient's Medications  New Prescriptions   No medications on file  Previous Medications   ASPIRIN 81 MG TABLET    Take 81 mg by mouth daily.   B COMPLEX VITAMINS (VITAMIN B COMPLEX PO)    Take 1 tablet by mouth daily.    CHOLECALCIFEROL (VITAMIN D) 2000 UNITS TABLET    Take 2,000 Units by mouth daily.   DENOSUMAB (PROLIA) 60 MG/ML SOLN INJECTION    Inject 60 mg into the skin every 6 (six) months. Administer in upper arm, thigh, or abdomen   FEEDING SUPPLEMENT, ENSURE ENLIVE, (ENSURE ENLIVE) LIQD    Take 237 mLs by mouth 2 (two) times daily between meals.   FINASTERIDE (PROSCAR) 5 MG TABLET    Take 5 mg by mouth daily.   FUROSEMIDE (LASIX) 20 MG TABLET    Take 10 mg by mouth daily.    LEVOTHYROXINE (SYNTHROID, LEVOTHROID) 137 MCG TABLET    Take one tablet by mouth 30 minutes before breakfast for thyroid.   LOSARTAN (COZAAR) 50 MG TABLET    TAKE 1/2 TABLET DAILY TO   STRENGTHEN THE HEART AND   CONTROL BLOOD PRESSURE   MULTIPLE VITAMIN (MULTIVITAMIN) TABLET    Take 1 tablet by mouth daily.   OMEGA-3 300 MG CAPS    Take 1 capsule by mouth daily.    RESVERATROL 100 MG CAPS    Take 1 capsule by  mouth daily.    TAMSULOSIN HCL (FLOMAX) 0.4 MG CAPS    Take 0.4 mg by mouth daily.   TRIAMCINOLONE CREAM (KENALOG) 0.1 %    Apply to  Lower legs twice daily   VANCOMYCIN (VANCOCIN) 50 MG/ML ORAL SOLUTION    Take 2.5 mLs (125 mg total) by mouth 4 (four) times daily.   VITAMIN C (ASCORBIC ACID) 500 MG TABLET    Take 500 mg by mouth daily.   VITAMIN E (VITAMIN E) 400 UNIT CAPSULE    Take 400 Units by mouth daily.  Modified Medications   No medications on file  Discontinued Medications   No medications on file     Review of Systems  Constitutional: Negative for fever, chills and diaphoresis.  HENT: Positive for hearing loss. Negative for congestion, ear discharge, ear pain, nosebleeds and tinnitus.   Eyes: Negative for photophobia, pain, discharge and redness.  Respiratory: Negative for cough, shortness of breath, wheezing and stridor.        Hoarse  Cardiovascular: Positive for leg swelling. Negative for chest pain.       Trace left pedal edema since the left hip fx surgical repair 12/2011-resolved.   Gastrointestinal:  Positive for diarrhea. Negative for nausea, vomiting, abdominal pain, constipation and blood in stool.       FOBT negative. External hemorrhoids.   Endocrine: Negative for polydipsia.  Genitourinary: Positive for frequency. Negative for dysuria, urgency, hematuria and flank pain.  Musculoskeletal: Positive for back pain. Negative for myalgias and neck pain.       R sciatica nerve pain.  History of OP. Treated with Prolia.  Skin: Negative for rash.       Spontaneous echhymosis  Allergic/Immunologic: Negative for environmental allergies.  Neurological: Negative for dizziness, tremors, seizures, weakness and headaches.  Hematological: Bruises/bleeds easily.       Limbs  Psychiatric/Behavioral: Negative for suicidal ideas and hallucinations. The patient is not nervous/anxious.     Filed Vitals:   03/06/15 1423  BP: 108/62  Pulse: 88  Temp: 98.4 F (36.9 C)  TempSrc:  Oral  Resp: 20  Height: $Remove'5\' 7"'iOUmtUM$  (1.702 m)  Weight: 129 lb 3.2 oz (58.605 kg)  SpO2: 96%   Body mass index is 20.23 kg/(m^2).  Physical Exam  Constitutional: He is oriented to person, place, and time. He appears well-developed and well-nourished. No distress.  HENT:  Head: Normocephalic and atraumatic.  Right Ear: External ear normal.  Left Ear: External ear normal.  Nose: Nose normal.  Mouth/Throat: Oropharynx is clear and moist. No oropharyngeal exudate.  Eyes: Conjunctivae and EOM are normal. Pupils are equal, round, and reactive to light. Right eye exhibits no discharge. Left eye exhibits no discharge. No scleral icterus.  Neck: Normal range of motion. Neck supple. No JVD present. No tracheal deviation present. No thyromegaly present.  Cardiovascular: Normal rate, regular rhythm, normal heart sounds and intact distal pulses.   No murmur heard. Pulmonary/Chest: Effort normal and breath sounds normal. No stridor. No respiratory distress. He has no wheezes. He has no rales. He exhibits no tenderness.  Hoarse  Abdominal: Soft. Bowel sounds are normal. He exhibits no distension. There is no tenderness. There is no rebound and no guarding.  External hemorrhoids.   Genitourinary: Guaiac negative stool.  Musculoskeletal: Normal range of motion. He exhibits edema. He exhibits no tenderness.  Trace left pedal edema since the left hip surgery 12/2011.  Using cane for balance.  Lymphadenopathy:    He has no cervical adenopathy.  Neurological: He is alert and oriented to person, place, and time. He has normal reflexes. No cranial nerve deficit. He exhibits normal muscle tone. Coordination normal.  Skin: Skin is warm and dry. No rash noted. He is not diaphoretic. No erythema. No pallor.  Spontaneous echhymosis.  Psychiatric: He has a normal mood and affect. His behavior is normal. Judgment and thought content normal.     Labs reviewed: Admission on 02/25/2015, Discharged on 02/27/2015    Component Date Value Ref Range Status  . WBC 02/25/2015 13.5* 4.0 - 10.5 K/uL Final  . RBC 02/25/2015 3.22* 4.22 - 5.81 MIL/uL Final  . Hemoglobin 02/25/2015 10.7* 13.0 - 17.0 g/dL Final  . HCT 02/25/2015 31.9* 39.0 - 52.0 % Final  . MCV 02/25/2015 99.1  78.0 - 100.0 fL Final  . MCH 02/25/2015 33.2  26.0 - 34.0 pg Final  . MCHC 02/25/2015 33.5  30.0 - 36.0 g/dL Final  . RDW 02/25/2015 12.7  11.5 - 15.5 % Final  . Platelets 02/25/2015 236  150 - 400 K/uL Final  . Neutrophils Relative % 02/25/2015 83* 43 - 77 % Final  . Neutro Abs 02/25/2015 11.1* 1.7 - 7.7 K/uL Final  . Lymphocytes  Relative 02/25/2015 4* 12 - 46 % Final  . Lymphs Abs 02/25/2015 0.6* 0.7 - 4.0 K/uL Final  . Monocytes Relative 02/25/2015 13* 3 - 12 % Final  . Monocytes Absolute 02/25/2015 1.8* 0.1 - 1.0 K/uL Final  . Eosinophils Relative 02/25/2015 0  0 - 5 % Final  . Eosinophils Absolute 02/25/2015 0.0  0.0 - 0.7 K/uL Final  . Basophils Relative 02/25/2015 0  0 - 1 % Final  . Basophils Absolute 02/25/2015 0.0  0.0 - 0.1 K/uL Final  . Sodium 02/25/2015 135  135 - 145 mmol/L Final  . Potassium 02/25/2015 3.6  3.5 - 5.1 mmol/L Final  . Chloride 02/25/2015 101  101 - 111 mmol/L Final  . CO2 02/25/2015 25  22 - 32 mmol/L Final  . Glucose, Bld 02/25/2015 120* 65 - 99 mg/dL Final  . BUN 02/25/2015 25* 6 - 20 mg/dL Final  . Creatinine, Ser 02/25/2015 0.99  0.61 - 1.24 mg/dL Final  . Calcium 02/25/2015 9.3  8.9 - 10.3 mg/dL Final  . Total Protein 02/25/2015 6.6  6.5 - 8.1 g/dL Final  . Albumin 02/25/2015 3.4* 3.5 - 5.0 g/dL Final  . AST 02/25/2015 27  15 - 41 U/L Final  . ALT 02/25/2015 18  17 - 63 U/L Final  . Alkaline Phosphatase 02/25/2015 46  38 - 126 U/L Final  . Total Bilirubin 02/25/2015 0.7  0.3 - 1.2 mg/dL Final  . GFR calc non Af Amer 02/25/2015 >60  >60 mL/min Final  . GFR calc Af Amer 02/25/2015 >60  >60 mL/min Final   Comment: (NOTE) The eGFR has been calculated using the CKD EPI equation. This calculation  has not been validated in all clinical situations. eGFR's persistently <60 mL/min signify possible Chronic Kidney Disease.   . Anion gap 02/25/2015 9  5 - 15 Final  . C difficile by pcr 02/25/2015 POSITIVE* NEGATIVE Final   Comment: CRITICAL RESULT CALLED TO, READ BACK BY AND VERIFIED WITH: M TEUP RN AT 0207 ON 07.12.16 BY G KONTOS   . Lactic Acid, Venous 02/25/2015 0.53  0.5 - 2.0 mmol/L Final  . Specimen Description 02/25/2015 STOOL   Final  . Special Requests 02/25/2015 NONE   Final  . Culture 02/25/2015    Final                   Value:NO SALMONELLA, SHIGELLA, CAMPYLOBACTER, YERSINIA, OR E.COLI 0157:H7 ISOLATED Performed at [facility]   . Report Status 02/25/2015 03/01/2015 FINAL   Final  . Sodium 02/26/2015 131* 135 - 145 mmol/L Final  . Potassium 02/26/2015 3.5  3.5 - 5.1 mmol/L Final  . Chloride 02/26/2015 98* 101 - 111 mmol/L Final  . CO2 02/26/2015 27  22 - 32 mmol/L Final  . Glucose, Bld 02/26/2015 97  65 - 99 mg/dL Final  . BUN 02/26/2015 22* 6 - 20 mg/dL Final  . Creatinine, Ser 02/26/2015 0.97  0.61 - 1.24 mg/dL Final  . Calcium 02/26/2015 8.0* 8.9 - 10.3 mg/dL Final  . Total Protein 02/26/2015 5.3* 6.5 - 8.1 g/dL Final  . Albumin 02/26/2015 2.8* 3.5 - 5.0 g/dL Final  . AST 02/26/2015 18  15 - 41 U/L Final  . ALT 02/26/2015 15* 17 - 63 U/L Final  . Alkaline Phosphatase 02/26/2015 38  38 - 126 U/L Final  . Total Bilirubin 02/26/2015 0.7  0.3 - 1.2 mg/dL Final  . GFR calc non Af Amer 02/26/2015 >60  >60 mL/min Final  . GFR  calc Af Amer 02/26/2015 >60  >60 mL/min Final   Comment: (NOTE) The eGFR has been calculated using the CKD EPI equation. This calculation has not been validated in all clinical situations. eGFR's persistently <60 mL/min signify possible Chronic Kidney Disease.   . Anion gap 02/26/2015 6  5 - 15 Final  . WBC 02/26/2015 9.1  4.0 - 10.5 K/uL Final  . RBC 02/26/2015 2.82* 4.22 - 5.81 MIL/uL Final  . Hemoglobin 02/26/2015 9.4* 13.0 - 17.0  g/dL Final  . HCT 02/26/2015 27.8* 39.0 - 52.0 % Final  . MCV 02/26/2015 98.6  78.0 - 100.0 fL Final  . MCH 02/26/2015 33.3  26.0 - 34.0 pg Final  . MCHC 02/26/2015 33.8  30.0 - 36.0 g/dL Final  . RDW 02/26/2015 12.8  11.5 - 15.5 % Final  . Platelets 02/26/2015 205  150 - 400 K/uL Final  . Neutrophils Relative % 02/26/2015 72  43 - 77 % Final  . Neutro Abs 02/26/2015 6.5  1.7 - 7.7 K/uL Final  . Lymphocytes Relative 02/26/2015 15  12 - 46 % Final  . Lymphs Abs 02/26/2015 1.4  0.7 - 4.0 K/uL Final  . Monocytes Relative 02/26/2015 12  3 - 12 % Final  . Monocytes Absolute 02/26/2015 1.1* 0.1 - 1.0 K/uL Final  . Eosinophils Relative 02/26/2015 1  0 - 5 % Final  . Eosinophils Absolute 02/26/2015 0.1  0.0 - 0.7 K/uL Final  . Basophils Relative 02/26/2015 0  0 - 1 % Final  . Basophils Absolute 02/26/2015 0.0  0.0 - 0.1 K/uL Final  . Sodium 02/27/2015 134* 135 - 145 mmol/L Final  . Potassium 02/27/2015 3.3* 3.5 - 5.1 mmol/L Final  . Chloride 02/27/2015 102  101 - 111 mmol/L Final  . CO2 02/27/2015 27  22 - 32 mmol/L Final  . Glucose, Bld 02/27/2015 101* 65 - 99 mg/dL Final  . BUN 02/27/2015 20  6 - 20 mg/dL Final  . Creatinine, Ser 02/27/2015 0.93  0.61 - 1.24 mg/dL Final  . Calcium 02/27/2015 8.4* 8.9 - 10.3 mg/dL Final  . GFR calc non Af Amer 02/27/2015 >60  >60 mL/min Final  . GFR calc Af Amer 02/27/2015 >60  >60 mL/min Final   Comment: (NOTE) The eGFR has been calculated using the CKD EPI equation. This calculation has not been validated in all clinical situations. eGFR's persistently <60 mL/min signify possible Chronic Kidney Disease.   . Anion gap 02/27/2015 5  5 - 15 Final  . WBC 02/27/2015 7.3  4.0 - 10.5 K/uL Final  . RBC 02/27/2015 2.85* 4.22 - 5.81 MIL/uL Final  . Hemoglobin 02/27/2015 9.6* 13.0 - 17.0 g/dL Final  . HCT 02/27/2015 28.5* 39.0 - 52.0 % Final  . MCV 02/27/2015 100.0  78.0 - 100.0 fL Final  . MCH 02/27/2015 33.7  26.0 - 34.0 pg Final  . MCHC 02/27/2015 33.7   30.0 - 36.0 g/dL Final  . RDW 02/27/2015 12.7  11.5 - 15.5 % Final  . Platelets 02/27/2015 247  150 - 400 K/uL Final  Office Visit on 02/20/2015  Component Date Value Ref Range Status  . WBC 02/20/2015 12.2* 3.4 - 10.8 x10E3/uL Final  . RBC 02/20/2015 3.16* 4.14 - 5.80 x10E6/uL Final  . Hemoglobin 02/20/2015 10.8* 12.6 - 17.7 g/dL Final  . Hematocrit 02/20/2015 31.8* 37.5 - 51.0 % Final  . MCV 02/20/2015 101* 79 - 97 fL Final  . MCH 02/20/2015 34.2* 26.6 - 33.0 pg Final  . MCHC 02/20/2015  34.0  31.5 - 35.7 g/dL Final  . RDW 02/20/2015 14.0  12.3 - 15.4 % Final  . Neutrophils 02/20/2015 87   Final  . Lymphs 02/20/2015 6   Final  . Monocytes 02/20/2015 7   Final  . Eos 02/20/2015 0   Final  . Basos 02/20/2015 0   Final  . Neutrophils Absolute 02/20/2015 10.6* 1.4 - 7.0 x10E3/uL Final  . Lymphocytes Absolute 02/20/2015 0.7  0.7 - 3.1 x10E3/uL Final  . Monocytes Absolute 02/20/2015 0.8  0.1 - 0.9 x10E3/uL Final  . EOS (ABSOLUTE) 02/20/2015 0.0  0.0 - 0.4 x10E3/uL Final  . Basophils Absolute 02/20/2015 0.0  0.0 - 0.2 x10E3/uL Final  . Immature Granulocytes 02/20/2015 0   Final  . Immature Grans (Abs) 02/20/2015 0.0  0.0 - 0.1 x10E3/uL Final  . Glucose 02/20/2015 110* 65 - 99 mg/dL Final  . BUN 02/20/2015 35  10 - 36 mg/dL Final  . Creatinine, Ser 02/20/2015 1.21  0.76 - 1.27 mg/dL Final  . GFR calc non Af Amer 02/20/2015 51* >59 mL/min/1.73 Final  . GFR calc Af Amer 02/20/2015 59* >59 mL/min/1.73 Final  . BUN/Creatinine Ratio 02/20/2015 29* 10 - 22 Final  . Sodium 02/20/2015 134  134 - 144 mmol/L Final  . Potassium 02/20/2015 4.4  3.5 - 5.2 mmol/L Final  . Chloride 02/20/2015 97  97 - 108 mmol/L Final  . CO2 02/20/2015 24  18 - 29 mmol/L Final  . Calcium 02/20/2015 9.7  8.6 - 10.2 mg/dL Final  . Total Protein 02/20/2015 6.0  6.0 - 8.5 g/dL Final  . Albumin 02/20/2015 3.7  3.2 - 4.6 g/dL Final  . Globulin, Total 02/20/2015 2.3  1.5 - 4.5 g/dL Final  . Albumin/Globulin Ratio  02/20/2015 1.6  1.1 - 2.5 Final  . Bilirubin Total 02/20/2015 0.4  0.0 - 1.2 mg/dL Final  . Alkaline Phosphatase 02/20/2015 39  39 - 117 IU/L Final  . AST 02/20/2015 28  0 - 40 IU/L Final  . ALT 02/20/2015 14  0 - 44 IU/L Final  . Urine Culture, Routine 02/20/2015 Final report   Final  . Result 1 02/20/2015 No growth   Final  . Specific Gravity, UA 02/20/2015 1.023  1.005 - 1.030 Final  . pH, UA 02/20/2015 5.5  5.0 - 7.5 Final  . Color, UA 02/20/2015 Yellow  Yellow Final  . Appearance Ur 02/20/2015 Clear  Clear Final  . Leukocytes, UA 02/20/2015 Negative  Negative Final  . Protein, UA 02/20/2015 Trace  Negative/Trace Final  . Glucose, UA 02/20/2015 Negative  Negative Final  . Ketones, UA 02/20/2015 Negative  Negative Final  . RBC, UA 02/20/2015 Negative  Negative Final  . Bilirubin, UA 02/20/2015 Negative  Negative Final  . Urobilinogen, Ur 02/20/2015 0.2  0.2 - 1.0 mg/dL Final  . Nitrite, UA 02/20/2015 Negative  Negative Final     Assessment/Plan  1. C. difficile colitis Resolving. Finish oral vancomycin.  2. Essential hypertension - Basic metabolic panel  3. Hypokalemia Follow-up BMP - Basic metabolic panel  4. Anemia, unspecified anemia type - CBC With Differential

## 2015-03-07 LAB — CBC WITH DIFFERENTIAL
Basophils Absolute: 0 10*3/uL (ref 0.0–0.2)
Basos: 1 %
EOS (ABSOLUTE): 0.2 10*3/uL (ref 0.0–0.4)
Eos: 2 %
Hematocrit: 31.6 % — ABNORMAL LOW (ref 37.5–51.0)
Hemoglobin: 10.8 g/dL — ABNORMAL LOW (ref 12.6–17.7)
Immature Grans (Abs): 0 10*3/uL (ref 0.0–0.1)
Immature Granulocytes: 0 %
Lymphocytes Absolute: 2 10*3/uL (ref 0.7–3.1)
Lymphs: 29 %
MCH: 34.1 pg — ABNORMAL HIGH (ref 26.6–33.0)
MCHC: 34.2 g/dL (ref 31.5–35.7)
MCV: 100 fL — ABNORMAL HIGH (ref 79–97)
Monocytes Absolute: 0.6 10*3/uL (ref 0.1–0.9)
Monocytes: 9 %
Neutrophils Absolute: 4.2 10*3/uL (ref 1.4–7.0)
Neutrophils: 59 %
RBC: 3.17 x10E6/uL — ABNORMAL LOW (ref 4.14–5.80)
RDW: 13.7 % (ref 12.3–15.4)
WBC: 7.1 10*3/uL (ref 3.4–10.8)

## 2015-03-07 LAB — BASIC METABOLIC PANEL
BUN/Creatinine Ratio: 30 — ABNORMAL HIGH (ref 10–22)
BUN: 29 mg/dL (ref 10–36)
CO2: 27 mmol/L (ref 18–29)
Calcium: 9.9 mg/dL (ref 8.6–10.2)
Chloride: 98 mmol/L (ref 97–108)
Creatinine, Ser: 0.98 mg/dL (ref 0.76–1.27)
GFR calc Af Amer: 76 mL/min/{1.73_m2} (ref >59)
GFR calc non Af Amer: 66 mL/min/{1.73_m2} (ref >59)
Glucose: 94 mg/dL (ref 65–99)
Potassium: 5 mmol/L (ref 3.5–5.2)
Sodium: 141 mmol/L (ref 134–144)

## 2015-04-03 DIAGNOSIS — L570 Actinic keratosis: Secondary | ICD-10-CM | POA: Diagnosis not present

## 2015-04-03 DIAGNOSIS — L299 Pruritus, unspecified: Secondary | ICD-10-CM | POA: Diagnosis not present

## 2015-04-15 ENCOUNTER — Other Ambulatory Visit: Payer: Self-pay | Admitting: Nurse Practitioner

## 2015-04-16 ENCOUNTER — Other Ambulatory Visit: Payer: Self-pay | Admitting: *Deleted

## 2015-04-16 MED ORDER — LOSARTAN POTASSIUM 50 MG PO TABS: ORAL_TABLET | ORAL | Status: DC

## 2015-04-16 NOTE — Telephone Encounter (Signed)
Patient requested Rx to be faxed to [facility]

## 2015-05-10 DIAGNOSIS — Z23 Encounter for immunization: Secondary | ICD-10-CM | POA: Diagnosis not present

## 2015-05-23 DIAGNOSIS — E559 Vitamin D deficiency, unspecified: Secondary | ICD-10-CM | POA: Diagnosis not present

## 2015-05-23 DIAGNOSIS — R5383 Other fatigue: Secondary | ICD-10-CM | POA: Diagnosis not present

## 2015-05-23 DIAGNOSIS — M4806 Spinal stenosis, lumbar region: Secondary | ICD-10-CM | POA: Diagnosis not present

## 2015-05-23 DIAGNOSIS — M810 Age-related osteoporosis without current pathological fracture: Secondary | ICD-10-CM | POA: Diagnosis not present

## 2015-05-31 DIAGNOSIS — M810 Age-related osteoporosis without current pathological fracture: Secondary | ICD-10-CM | POA: Diagnosis not present

## 2015-05-31 DIAGNOSIS — E559 Vitamin D deficiency, unspecified: Secondary | ICD-10-CM | POA: Diagnosis not present

## 2015-06-11 ENCOUNTER — Ambulatory Visit (INDEPENDENT_AMBULATORY_CARE_PROVIDER_SITE_OTHER): Payer: Medicare Other | Admitting: Internal Medicine

## 2015-06-11 ENCOUNTER — Encounter: Payer: Self-pay | Admitting: Internal Medicine

## 2015-06-11 VITALS — BP 122/58 | HR 83 | Temp 97.7°F | Resp 20 | Ht 67.0 in | Wt 135.2 lb

## 2015-06-11 DIAGNOSIS — R609 Edema, unspecified: Secondary | ICD-10-CM | POA: Diagnosis not present

## 2015-06-11 DIAGNOSIS — E038 Other specified hypothyroidism: Secondary | ICD-10-CM | POA: Diagnosis not present

## 2015-06-11 DIAGNOSIS — I10 Essential (primary) hypertension: Secondary | ICD-10-CM | POA: Diagnosis not present

## 2015-06-11 DIAGNOSIS — D649 Anemia, unspecified: Secondary | ICD-10-CM

## 2015-06-11 DIAGNOSIS — A047 Enterocolitis due to Clostridium difficile: Secondary | ICD-10-CM | POA: Diagnosis not present

## 2015-06-11 DIAGNOSIS — M810 Age-related osteoporosis without current pathological fracture: Secondary | ICD-10-CM

## 2015-06-11 DIAGNOSIS — E876 Hypokalemia: Secondary | ICD-10-CM | POA: Diagnosis not present

## 2015-06-11 DIAGNOSIS — A0472 Enterocolitis due to Clostridium difficile, not specified as recurrent: Secondary | ICD-10-CM

## 2015-06-11 DIAGNOSIS — F4321 Adjustment disorder with depressed mood: Secondary | ICD-10-CM | POA: Insufficient documentation

## 2015-06-11 NOTE — Progress Notes (Signed)
Patient ID: Cory Burton, male   DOB: 1917/09/22, 81 y.o.   MRN: [id number]    Facility  [facility]    Place of Service:   OFFICE    Allergies  Allergen Reactions  . Clindamycin/Lincomycin     Developed C. dificile  . Horse-Derived Products     Horse serum  . Mold Extract [Trichophyton]     Chief Complaint  Patient presents with  . Medical Management of Chronic Issues    3 mo F/U -anemia    HPI:  Grief - wife died a few days ago. The beachchair was published yesterday. Patient is coping fairly well. His daughter from out of town was a help at the time of his wife's illness and ultimate death. He is at peace with her death.  Anemia, unspecified anemia type - improved  Essential hypertension - Controlled  Hypokalemia - needs follow-up  Other specified hypothyroidism - needs follow-up  Osteoporosis - needs a follow-up bone density test  C. difficile colitis - resolved  Edema, unspecified type - improved  Pruritus - patient thinks this may have been related Prolia. He plans not to use anymore this despite his known osteoporosis. He went to see a dermatologist. He was given a prescription for clobetasol propionate 0.05% cream. He finds that this is giving him some relief.    Medications: Patient's Medications  New Prescriptions   No medications on file  Previous Medications   ASPIRIN 81 MG TABLET    Take 81 mg by mouth daily.   B COMPLEX VITAMINS (VITAMIN B COMPLEX PO)    Take 1 tablet by mouth daily.    CHOLECALCIFEROL (VITAMIN D) 2000 UNITS TABLET    Take 2,000 Units by mouth daily.   CLOBETASOL CREAM (TEMOVATE) 0.05 %    APPLY TO AFFECTED AREA EVERY DAY   DENOSUMAB (PROLIA) 60 MG/ML SOLN INJECTION    Inject 60 mg into the skin every 6 (six) months. Administer in upper arm, thigh, or abdomen   FEEDING SUPPLEMENT, ENSURE ENLIVE, (ENSURE ENLIVE) LIQD    Take 237 mLs by mouth 2 (two) times daily between meals.   FINASTERIDE (PROSCAR) 5 MG TABLET    Take 5 mg by mouth daily.     FUROSEMIDE (LASIX) 20 MG TABLET    Take 10 mg by mouth daily.    LEVOTHYROXINE (SYNTHROID, LEVOTHROID) 137 MCG TABLET    Take one tablet by mouth 30 minutes before breakfast for thyroid.   LOSARTAN (COZAAR) 50 MG TABLET    Take 1/2 tablet by mouth once daily to strengthen heart and to control blood pressure   MULTIPLE VITAMIN (MULTIVITAMIN) TABLET    Take 1 tablet by mouth daily.   OMEGA-3 300 MG CAPS    Take 1 capsule by mouth daily.    RESVERATROL 100 MG CAPS    Take 1 capsule by mouth daily.    TAMSULOSIN HCL (FLOMAX) 0.4 MG CAPS    Take 0.4 mg by mouth daily.   TRIAMCINOLONE CREAM (KENALOG) 0.1 %    Apply to  Lower legs twice daily   VITAMIN C (ASCORBIC ACID) 500 MG TABLET    Take 500 mg by mouth daily.   VITAMIN E (VITAMIN E) 400 UNIT CAPSULE    Take 400 Units by mouth daily.  Modified Medications   No medications on file  Discontinued Medications   No medications on file     Review of Systems  Constitutional: Negative for fever, chills and diaphoresis.  HENT: Positive for hearing  loss. Negative for congestion, ear discharge, ear pain, nosebleeds and tinnitus.   Eyes: Negative for photophobia, pain, discharge and redness.  Respiratory: Negative for cough, shortness of breath, wheezing and stridor.        Hoarse  Cardiovascular: Positive for leg swelling. Negative for chest pain.       Trace left pedal edema since the left hip fx surgical repair 12/2011-resolved.   Gastrointestinal: Positive for diarrhea. Negative for nausea, vomiting, abdominal pain, constipation and blood in stool.       FOBT negative. External hemorrhoids.   Endocrine: Negative for polydipsia.  Genitourinary: Positive for frequency. Negative for dysuria, urgency, hematuria and flank pain.  Musculoskeletal: Positive for back pain. Negative for myalgias and neck pain.       R sciatica nerve pain.  History of OP. Treated with Prolia.  Skin: Negative for rash.       Spontaneous echhymosis  Allergic/Immunologic:  Negative for environmental allergies.  Neurological: Negative for dizziness, tremors, seizures, weakness and headaches.  Hematological: Bruises/bleeds easily.       Limbs  Psychiatric/Behavioral: Negative for suicidal ideas and hallucinations. The patient is not nervous/anxious.     Filed Vitals:   06/11/15 1458  BP: 122/58  Pulse: 83  Temp: 97.7 F (36.5 C)  TempSrc: Oral  Resp: 20  Height: $Remove'5\' 7"'SimunMY$  (1.702 m)  Weight: 135 lb 3.2 oz (61.326 kg)  SpO2: 92%   Body mass index is 21.17 kg/(m^2).  Physical Exam  Constitutional: He is oriented to person, place, and time. He appears well-developed and well-nourished. No distress.  HENT:  Head: Normocephalic and atraumatic.  Right Ear: External ear normal.  Left Ear: External ear normal.  Nose: Nose normal.  Mouth/Throat: Oropharynx is clear and moist. No oropharyngeal exudate.  Eyes: Conjunctivae and EOM are normal. Pupils are equal, round, and reactive to light. Right eye exhibits no discharge. Left eye exhibits no discharge. No scleral icterus.  Neck: Normal range of motion. Neck supple. No JVD present. No tracheal deviation present. No thyromegaly present.  Cardiovascular: Normal rate, regular rhythm, normal heart sounds and intact distal pulses.   No murmur heard. Pulmonary/Chest: Effort normal and breath sounds normal. No stridor. No respiratory distress. He has no wheezes. He has no rales. He exhibits no tenderness.  Hoarse  Abdominal: Soft. Bowel sounds are normal. He exhibits no distension. There is no tenderness. There is no rebound and no guarding.  External hemorrhoids.   Genitourinary: Guaiac negative stool.  Musculoskeletal: Normal range of motion. He exhibits edema. He exhibits no tenderness.  Trace left pedal edema since the left hip surgery 12/2011.  Using cane for balance.  Lymphadenopathy:    He has no cervical adenopathy.  Neurological: He is alert and oriented to person, place, and time. He has normal reflexes. No  cranial nerve deficit. He exhibits normal muscle tone. Coordination normal.  Skin: Skin is warm and dry. No rash noted. He is not diaphoretic. No erythema. No pallor.  Spontaneous echhymosis.  Psychiatric: He has a normal mood and affect. His behavior is normal. Judgment and thought content normal.     Labs reviewed: Lab Summary Latest Ref Rng 03/06/2015 02/27/2015 02/26/2015 02/25/2015  Hemoglobin 12.6 - 17.7 g/dL 10.8(L) 9.6(L) 9.4(L) 10.7(L)  Hematocrit 37.5 - 51.0 % 31.6(L) 28.5(L) 27.8(L) 31.9(L)  White count 3.4 - 10.8 x10E3/uL 7.1 7.3 9.1 13.5(H)  Platelet count 150 - 400 K/uL (None) 247 205 236  Sodium 134 - 144 mmol/L 141 134(L) 131(L) 135  Potassium 3.5 -  5.2 mmol/L 5.0 3.3(L) 3.5 3.6  Calcium 8.6 - 10.2 mg/dL 9.9 8.4(L) 8.0(L) 9.3  Phosphorus - (None) (None) (None) (None)  Creatinine 0.76 - 1.27 mg/dL 0.98 0.93 0.97 0.99  AST 15 - 41 U/L (None) (None) 18 27  Alk Phos 38 - 126 U/L (None) (None) 38 46  Bilirubin 0.3 - 1.2 mg/dL (None) (None) 0.7 0.7  Glucose 65 - 99 mg/dL 94 101(H) 97 120(H)  Cholesterol - (None) (None) (None) (None)  HDL cholesterol - (None) (None) (None) (None)  Triglycerides - (None) (None) (None) (None)  LDL Direct - (None) (None) (None) (None)  LDL Calc - (None) (None) (None) (None)  Total protein 6.5 - 8.1 g/dL (None) (None) 5.3(L) 6.6  Albumin 3.5 - 5.0 g/dL (None) (None) 2.8(L) 3.4(L)   Lab Results  Component Value Date   TSH 2.23 10/18/2014   Lab Results  Component Value Date   BUN 29 03/06/2015   No results found for: HGBA1C     Assessment/Plan   1. Grief Recent death of his wife. Patient is at peace with this.  2. Anemia, unspecified anemia type - CBC With Differential  3. Essential hypertension Controlled  4. Hypokalemia - Comprehensive metabolic panel  5. Other specified hypothyroidism - TSH  6. Osteoporosis - DG Bone Density; Future  7. C. difficile colitis Resolved  8. Edema, unspecified type Improved

## 2015-06-12 LAB — COMPREHENSIVE METABOLIC PANEL
ALT: 12 IU/L (ref 0–44)
AST: 23 IU/L (ref 0–40)
Albumin/Globulin Ratio: 1.8 (ref 1.1–2.5)
Albumin: 4 g/dL (ref 3.2–4.6)
Alkaline Phosphatase: 50 IU/L (ref 39–117)
BUN/Creatinine Ratio: 20 (ref 10–22)
BUN: 17 mg/dL (ref 10–36)
Bilirubin Total: 0.3 mg/dL (ref 0.0–1.2)
CO2: 26 mmol/L (ref 18–29)
Calcium: 9.6 mg/dL (ref 8.6–10.2)
Chloride: 101 mmol/L (ref 97–106)
Creatinine, Ser: 0.85 mg/dL (ref 0.76–1.27)
GFR calc Af Amer: 86 mL/min/{1.73_m2} (ref >59)
GFR calc non Af Amer: 74 mL/min/{1.73_m2} (ref >59)
Globulin, Total: 2.2 g/dL (ref 1.5–4.5)
Glucose: 158 mg/dL — ABNORMAL HIGH (ref 65–99)
Potassium: 4.8 mmol/L (ref 3.5–5.2)
Sodium: 139 mmol/L (ref 136–144)
Total Protein: 6.2 g/dL (ref 6.0–8.5)

## 2015-06-12 LAB — TSH: TSH: 1.43 u[IU]/mL (ref 0.450–4.500)

## 2015-06-12 LAB — CBC WITH DIFFERENTIAL
Basophils Absolute: 0 10*3/uL (ref 0.0–0.2)
Basos: 0 %
EOS (ABSOLUTE): 0.3 10*3/uL (ref 0.0–0.4)
Eos: 5 %
Hematocrit: 34 % — ABNORMAL LOW (ref 37.5–51.0)
Hemoglobin: 11.3 g/dL — ABNORMAL LOW (ref 12.6–17.7)
Immature Grans (Abs): 0 10*3/uL (ref 0.0–0.1)
Immature Granulocytes: 0 %
Lymphocytes Absolute: 1.8 10*3/uL (ref 0.7–3.1)
Lymphs: 28 %
MCH: 34.2 pg — ABNORMAL HIGH (ref 26.6–33.0)
MCHC: 33.2 g/dL (ref 31.5–35.7)
MCV: 103 fL — ABNORMAL HIGH (ref 79–97)
Monocytes Absolute: 0.5 10*3/uL (ref 0.1–0.9)
Monocytes: 8 %
Neutrophils Absolute: 3.6 10*3/uL (ref 1.4–7.0)
Neutrophils: 59 %
RBC: 3.3 x10E6/uL — ABNORMAL LOW (ref 4.14–5.80)
RDW: 14.5 % (ref 12.3–15.4)
WBC: 6.3 10*3/uL (ref 3.4–10.8)

## 2015-07-16 ENCOUNTER — Other Ambulatory Visit: Payer: Self-pay | Admitting: *Deleted

## 2015-07-16 MED ORDER — LEVOTHYROXINE SODIUM 137 MCG PO TABS: ORAL_TABLET | ORAL | Status: DC

## 2015-07-16 NOTE — Telephone Encounter (Signed)
Patient called and requested to be faxed to [facility] home delivery

## 2015-07-31 ENCOUNTER — Other Ambulatory Visit: Payer: Self-pay | Admitting: *Deleted

## 2015-07-31 DIAGNOSIS — M810 Age-related osteoporosis without current pathological fracture: Secondary | ICD-10-CM

## 2015-08-01 ENCOUNTER — Telehealth: Payer: Self-pay

## 2015-08-01 ENCOUNTER — Non-Acute Institutional Stay: Payer: Medicare Other | Admitting: Nurse Practitioner

## 2015-08-01 ENCOUNTER — Encounter: Payer: Self-pay | Admitting: Nurse Practitioner

## 2015-08-01 VITALS — BP 126/78 | HR 60 | Temp 97.9°F | Ht 67.0 in | Wt 136.0 lb

## 2015-08-01 DIAGNOSIS — E039 Hypothyroidism, unspecified: Secondary | ICD-10-CM

## 2015-08-01 DIAGNOSIS — R609 Edema, unspecified: Secondary | ICD-10-CM

## 2015-08-01 DIAGNOSIS — F4321 Adjustment disorder with depressed mood: Secondary | ICD-10-CM | POA: Diagnosis not present

## 2015-08-01 DIAGNOSIS — N400 Enlarged prostate without lower urinary tract symptoms: Secondary | ICD-10-CM

## 2015-08-01 DIAGNOSIS — D649 Anemia, unspecified: Secondary | ICD-10-CM | POA: Diagnosis not present

## 2015-08-01 DIAGNOSIS — I10 Essential (primary) hypertension: Secondary | ICD-10-CM

## 2015-08-01 DIAGNOSIS — M810 Age-related osteoporosis without current pathological fracture: Secondary | ICD-10-CM

## 2015-08-01 NOTE — Telephone Encounter (Signed)
Patient was seen today by [place], he was asking about his appointment for Bone Density that Dr. Nyoka Cowden was going to schedule. He gets Prolia injections at Dr. Kathrin Penner office. I asked the patient when his last Density test was, he didn't know, asked if Dr. Kathrin Penner office has done one because she wouldn't had started the Prolia injections without doing a test first. Called Dr. Kathrin Penner office, they did bone density on 05/17/2014, asked for them to fax a copy to Dr. Nyoka Cowden, they need records release faxed to them at [phone]. Had patient to sign release to fax over.

## 2015-08-08 NOTE — Assessment & Plan Note (Signed)
Dc Prolia per the patient's request, he c/o itching skin associated with Prolia use.

## 2015-08-08 NOTE — Assessment & Plan Note (Addendum)
10/19/14 TSH 2.23 takes Levothyroxine 193mcg daily. Update TSH

## 2015-08-08 NOTE — Progress Notes (Signed)
Patient ID: Cory Burton, male   DOB: 1917/09/07, 81 y.o.   MRN: [id number]  Location:  clinic [place] Provider:  Marlana Latus NP  Code Status:  DNR Goals of care: Advanced Directive information Does patient have an advance directive?: Yes, Type of Advance Directive: [place];Living will  Chief Complaint  Patient presents with  . Medical Management of Chronic Issues    question about beening on Lasix. Doesn't have a bottle at home for it, should he continue. Doesn't know how long he has been without it.     HPI: Patient is a 81 y.o. male seen in the clinic at [facility] today for evaluation of BLE edema, previous Furosemide, c/o Prolia caused itching skin, desires no longer treating osteoporosis, stated he is not eating well since his wife passed away, but he declined antidepressant today.   Review of Systems  Constitutional: Negative for fever, chills and diaphoresis.  HENT: Positive for hearing loss. Negative for congestion, ear discharge, ear pain, nosebleeds and tinnitus.   Eyes: Negative for photophobia, pain, discharge and redness.  Respiratory: Negative for cough, shortness of breath, wheezing and stridor.        Hoarse  Cardiovascular: Positive for leg swelling. Negative for chest pain.       1+ edema BLE  Gastrointestinal: Positive for diarrhea. Negative for nausea, vomiting, abdominal pain, constipation and blood in stool.       FOBT negative. External hemorrhoids.   Genitourinary: Positive for frequency. Negative for dysuria, urgency, hematuria and flank pain.  Musculoskeletal: Positive for back pain. Negative for myalgias and neck pain.       R sciatica nerve pain.  History of OP. Treated with Prolia.  Skin: Negative for rash.       Spontaneous echhymosis  Neurological: Negative for dizziness, tremors, seizures, weakness and headaches.  Endo/Heme/Allergies: Negative for environmental allergies and polydipsia. Bruises/bleeds easily.       Limbs    Psychiatric/Behavioral: Negative for suicidal ideas and hallucinations. The patient is not nervous/anxious.     Past Medical History  Diagnosis Date  . Thyroid disease   . Hypertension   . BPH (benign prostatic hyperplasia)   . Fracture, intertrochanteric, left femur ([place]) 12/17/2011  . Senile osteoporosis 08/25/2012  . Edema 04/28/2012  . Unspecified hearing loss 12/24/2011  . Cardiomegaly 12/24/2011  . Degeneration of thoracic or thoracolumbar intervertebral disc 12/24/2011  . Spinal stenosis, lumbar region, without neurogenic claudication 12/24/2011  . Other abnormal blood chemistry 12/24/2011  . Unspecified hypothyroidism 12/23/2011  . Acute posthemorrhagic anemia 12/23/2011  . Anemia 03/06/2015    Patient Active Problem List   Diagnosis Date Noted  . Grief 06/11/2015  . Anemia 03/06/2015  . Hypokalemia   . C. difficile colitis 02/26/2015  . Osteoporosis 10/25/2014  . Sciatica of right side 04/26/2014  . HTN (hypertension) 04/28/2013  . Edema 04/28/2013  . BPH (benign prostatic hyperplasia) 04/28/2013  . Hypothyroidism 04/28/2013  . Fracture, intertrochanteric, left femur ([place]) 12/17/2011    Allergies  Allergen Reactions  . Clindamycin/Lincomycin     Developed C. dificile  . Horse-Derived Products     Horse serum  . Mold Extract [Trichophyton]     Medications: Patient's Medications  New Prescriptions   No medications on file  Previous Medications   ASPIRIN 81 MG TABLET    Take 81 mg by mouth daily.   B COMPLEX VITAMINS (VITAMIN B COMPLEX PO)    Take 1 tablet by mouth daily.    CHOLECALCIFEROL (  VITAMIN D) 2000 UNITS TABLET    Take 2,000 Units by mouth daily.   CLOBETASOL CREAM (TEMOVATE) 0.05 %    APPLY TO AFFECTED AREA EVERY DAY   DENOSUMAB (PROLIA) 60 MG/ML SOLN INJECTION    Inject 60 mg into the skin every 6 (six) months. Administer in upper arm, thigh, or abdomen   FEEDING SUPPLEMENT, ENSURE ENLIVE, (ENSURE ENLIVE) LIQD    Take 237 mLs by mouth 2 (two) times daily between  meals.   FINASTERIDE (PROSCAR) 5 MG TABLET    Take 5 mg by mouth daily.   FUROSEMIDE (LASIX) 20 MG TABLET    Take 10 mg by mouth daily. Reported on 08/01/2015   LEVOTHYROXINE (SYNTHROID, LEVOTHROID) 137 MCG TABLET    Take one tablet by mouth 30 minutes before breakfast for thyroid.   LOSARTAN (COZAAR) 50 MG TABLET    Take 1/2 tablet by mouth once daily to strengthen heart and to control blood pressure   MULTIPLE VITAMIN (MULTIVITAMIN) TABLET    Take 1 tablet by mouth daily.   OMEGA-3 300 MG CAPS    Take 1 capsule by mouth daily.    RESVERATROL 100 MG CAPS    Take 1 capsule by mouth daily.    TAMSULOSIN HCL (FLOMAX) 0.4 MG CAPS    Take 0.4 mg by mouth daily.   TRIAMCINOLONE CREAM (KENALOG) 0.1 %    Apply to  Lower legs twice daily   VITAMIN C (ASCORBIC ACID) 500 MG TABLET    Take 500 mg by mouth daily.   VITAMIN E (VITAMIN E) 400 UNIT CAPSULE    Take 400 Units by mouth daily.  Modified Medications   No medications on file  Discontinued Medications   No medications on file    Physical Exam: Filed Vitals:   08/01/15 1530  BP: 126/78  Pulse: 60  Temp: 97.9 F (36.6 C)  TempSrc: Oral  Height: 5\' 7"  (1.702 m)  Weight: 136 lb (61.689 kg)  SpO2: 92%   Body mass index is 21.3 kg/(m^2).  Physical Exam  Constitutional: He is oriented to person, place, and time. He appears well-developed and well-nourished. No distress.  HENT:  Head: Normocephalic and atraumatic.  Right Ear: External ear normal.  Left Ear: External ear normal.  Nose: Nose normal.  Mouth/Throat: Oropharynx is clear and moist. No oropharyngeal exudate.  Eyes: Conjunctivae and EOM are normal. Pupils are equal, round, and reactive to light. Right eye exhibits no discharge. Left eye exhibits no discharge. No scleral icterus.  Neck: Normal range of motion. Neck supple. No JVD present. No tracheal deviation present. No thyromegaly present.  Cardiovascular: Normal rate, regular rhythm, normal heart sounds and intact distal  pulses.   No murmur heard. Pulmonary/Chest: Effort normal and breath sounds normal. No stridor. No respiratory distress. He has no wheezes. He has no rales. He exhibits no tenderness.  Hoarse  Abdominal: Soft. Bowel sounds are normal. He exhibits no distension. There is no tenderness. There is no rebound and no guarding.  External hemorrhoids.   Genitourinary: Guaiac negative stool.  Musculoskeletal: Normal range of motion. He exhibits edema. He exhibits no tenderness.  1+ edema BLE  Lymphadenopathy:    He has no cervical adenopathy.  Neurological: He is alert and oriented to person, place, and time. He has normal reflexes. No cranial nerve deficit. He exhibits normal muscle tone. Coordination normal.  Skin: Skin is warm and dry. No rash noted. He is not diaphoretic. No erythema. No pallor.  Spontaneous echhymosis.  Psychiatric:  He has a normal mood and affect. His behavior is normal. Judgment and thought content normal.  Stated his appetite has been poor since his wife passed away    Labs reviewed: Basic Metabolic Panel:  Recent Labs  02/27/15 0410 03/06/15 1539 06/11/15  NA 134* 141 139  K 3.3* 5.0 4.8  CL 102 98 101  CO2 27 27 26   GLUCOSE 101* 94 158*  BUN 20 29 17   CREATININE 0.93 0.98 0.85  CALCIUM 8.4* 9.9 9.6    Liver Function Tests:  Recent Labs  02/25/15 1640 02/26/15 0513 06/11/15  AST 27 18 23   ALT 18 15* 12  ALKPHOS 46 38 50  BILITOT 0.7 0.7 0.3  PROT 6.6 5.3* 6.2  ALBUMIN 3.4* 2.8* 4.0    CBC:  Recent Labs  02/25/15 1640 02/26/15 0513 02/27/15 0410 03/06/15 1539 06/11/15  WBC 13.5* 9.1 7.3 7.1 6.3  NEUTROABS 11.1* 6.5  --  4.2 3.6  HGB 10.7* 9.4* 9.6*  --   --   HCT 31.9* 27.8* 28.5* 31.6* 34.0*  MCV 99.1 98.6 100.0  --   --   PLT 236 205 247  --   --     Lab Results  Component Value Date   TSH 1.430 06/11/2015   No results found for: HGBA1C No results found for: CHOL, HDL, LDLCALC, LDLDIRECT, TRIG, CHOLHDL  Significant Diagnostic  Results since last visit: none  Patient Care Team: Estill Dooms, MD as PCP - General (Internal Medicine) [place] Shimika Ames X, NP as Nurse Practitioner (Nurse Practitioner) Earlie Server, MD as Consulting Physician (Orthopedic Surgery) Jovita Gamma, MD as Consulting Physician (Neurosurgery) Rutherford Guys, MD as Consulting Physician (Ophthalmology) Jettie Booze, MD as Consulting Physician (Cardiology) Marchia Bond, MD as Consulting Physician (Orthopedic Surgery) Hurman Horn, MD as Consulting Physician (Ophthalmology) Verner Chol, MD as Consulting Physician (Sports Medicine)  Assessment/Plan Problem List Items Addressed This Visit    Anemia    Update CBC, last Hgb 9s 02/2015.       BPH (benign prostatic hyperplasia)    No urinary retention, takes Tamsulosin and Finasteride. 1-2x/night bathroom trips. 10/23/14 Urology: asymptomatic f/u 1 year       Edema - Primary    1+ edema BLE, no SOB or cough, will resume Furosemide 10mg  daily, update CMP      Grief    Supportive, the patient denied s/s of depression, declined trial of Sertraline.       HTN (hypertension)    Controlled on Losartan 25mg , update CMP      Hypothyroidism    10/19/14 TSH 2.23 takes Levothyroxine 172mcg daily. Update TSH      Osteoporosis    Dc Prolia per the patient's request, he c/o itching skin associated with Prolia use.           Family/ staff Communication: observe the patient, continue IL   Labs/tests ordered:  CBC, CMP, TSH  ManXie Zyler Hyson NP [facility] [address], [place] [address] On Call:  [phone] & follow prompts after 5pm & weekends Office Phone:  [phone] Office Fax:  [phone]

## 2015-08-08 NOTE — Assessment & Plan Note (Signed)
1+ edema BLE, no SOB or cough, will resume Furosemide 10mg  daily, update CMP

## 2015-08-09 ENCOUNTER — Encounter: Payer: Self-pay | Admitting: *Deleted

## 2015-08-09 NOTE — Assessment & Plan Note (Signed)
Update CBC, last Hgb 9s 02/2015.

## 2015-08-09 NOTE — Assessment & Plan Note (Signed)
No urinary retention, takes Tamsulosin and Finasteride. 1-2x/night bathroom trips. 10/23/14 Urology: asymptomatic f/u 1 year  

## 2015-08-09 NOTE — Assessment & Plan Note (Signed)
Supportive, the patient denied s/s of depression, declined trial of Sertraline.

## 2015-08-09 NOTE — Assessment & Plan Note (Signed)
Controlled on Losartan 25mg , update CMP

## 2015-09-05 ENCOUNTER — Other Ambulatory Visit: Payer: 59

## 2015-09-09 DIAGNOSIS — H353131 Nonexudative age-related macular degeneration, bilateral, early dry stage: Secondary | ICD-10-CM | POA: Diagnosis not present

## 2015-09-09 DIAGNOSIS — Z961 Presence of intraocular lens: Secondary | ICD-10-CM | POA: Diagnosis not present

## 2015-10-15 DIAGNOSIS — R609 Edema, unspecified: Secondary | ICD-10-CM | POA: Diagnosis not present

## 2015-10-15 DIAGNOSIS — E039 Hypothyroidism, unspecified: Secondary | ICD-10-CM | POA: Diagnosis not present

## 2015-10-15 DIAGNOSIS — I10 Essential (primary) hypertension: Secondary | ICD-10-CM | POA: Diagnosis not present

## 2015-10-15 LAB — BASIC METABOLIC PANEL
BUN: 24 mg/dL — AB (ref 4–21)
Creatinine: 1.1 mg/dL (ref 0.6–1.3)
Glucose: 90 mg/dL
Potassium: 4.4 mmol/L (ref 3.4–5.3)
Sodium: 139 mmol/L (ref 137–147)

## 2015-10-15 LAB — CBC AND DIFFERENTIAL
HCT: 34 % — AB (ref 41–53)
Hemoglobin: 11.3 g/dL — AB (ref 13.5–17.5)
Platelets: 226 10*3/uL (ref 150–399)
WBC: 6.3 10^3/mL

## 2015-10-15 LAB — HEPATIC FUNCTION PANEL
ALT: 12 U/L (ref 10–40)
AST: 20 U/L (ref 14–40)
Alkaline Phosphatase: 38 U/L (ref 25–125)
Bilirubin, Total: 0.4 mg/dL

## 2015-10-15 LAB — TSH: TSH: 1.47 u[IU]/mL (ref 0.41–5.90)

## 2015-10-18 ENCOUNTER — Encounter: Payer: Self-pay | Admitting: *Deleted

## 2015-10-24 ENCOUNTER — Non-Acute Institutional Stay: Payer: Medicare Other | Admitting: Nurse Practitioner

## 2015-10-24 ENCOUNTER — Encounter: Payer: Self-pay | Admitting: Nurse Practitioner

## 2015-10-24 VITALS — HR 65 | Temp 97.9°F | Ht 67.0 in | Wt 140.0 lb

## 2015-10-24 DIAGNOSIS — N400 Enlarged prostate without lower urinary tract symptoms: Secondary | ICD-10-CM

## 2015-10-24 DIAGNOSIS — I10 Essential (primary) hypertension: Secondary | ICD-10-CM

## 2015-10-24 DIAGNOSIS — R269 Unspecified abnormalities of gait and mobility: Secondary | ICD-10-CM | POA: Diagnosis not present

## 2015-10-24 DIAGNOSIS — E039 Hypothyroidism, unspecified: Secondary | ICD-10-CM | POA: Diagnosis not present

## 2015-10-24 DIAGNOSIS — E876 Hypokalemia: Secondary | ICD-10-CM | POA: Diagnosis not present

## 2015-10-24 DIAGNOSIS — R609 Edema, unspecified: Secondary | ICD-10-CM

## 2015-10-24 DIAGNOSIS — D649 Anemia, unspecified: Secondary | ICD-10-CM

## 2015-10-24 NOTE — Assessment & Plan Note (Signed)
Trace edema BLE, no SOB or cough, continue Furosemide 10mg daily, Na 139, K 4.4, Bun 24, creat 1.08 10/15/15   

## 2015-10-24 NOTE — Assessment & Plan Note (Signed)
10/15/15 K 4.4

## 2015-10-24 NOTE — Assessment & Plan Note (Signed)
10/19/14 TSH 2.23, 10/15/15 TSH 1.47, continue Levothyroxine 137mcg daily   

## 2015-10-24 NOTE — Progress Notes (Signed)
Patient ID: Cory Burton, male   DOB: 08-Feb-1918, 82 y.o.   MRN: [id number] MMSE 26/30 passed clock drawing

## 2015-10-24 NOTE — Assessment & Plan Note (Signed)
Controlled on Losartan 25mg ,

## 2015-10-24 NOTE — Assessment & Plan Note (Signed)
No urinary retention, continue Tamsulosin and Finasteride. 1-2x/night bathroom trips. 10/23/14 Urology: asymptomatic f/u 1 year  

## 2015-10-24 NOTE — Progress Notes (Signed)
Patient ID: Cory Burton, male   DOB: 1918/07/10, 82 y.o.   MRN: [id number]   Location:  [facility] (12) Provider: Marlana Latus NP  Code Status: DNR Goals of Care:  Advanced Directives 10/24/2015  Does patient have an advance directive? Yes  Type of Paramedic of [place];Living will  Copy of advanced directive(s) in chart? Yes     Chief Complaint  Patient presents with  . Annual Exam    wellness exam  . Medical Management of Chronic Issues    blood pressure, thyroid and anemia  . MMSE    26/30 passed clock drawing    HPI: Patient is a 82 y.o. male seen today for medical management of chronic diseases.  Hx of BPH, no urinary retention while on Finasteride and Tamsulosin. Chronic BLE edema, denied cough or paroxysmal nocturnal orthopnea, takes Furosemide 20mg  daily, Bun/creat 24/1.08 10/15/15, hypothyroidism, takes Levothyroxine 166mcg, last TSH 1.47 10/15/15. His blood pressure is controlled on Losartan 50mg .   C/o his balance is off, not walking straight, but no decreased muscle strength, change of vision, denied dizziness or headaches.   Past Medical History  Diagnosis Date  . Thyroid disease   . Hypertension   . BPH (benign prostatic hyperplasia)   . Fracture, intertrochanteric, left femur ([place]) 12/17/2011  . Senile osteoporosis 08/25/2012  . Edema 04/28/2012  . Unspecified hearing loss 12/24/2011  . Cardiomegaly 12/24/2011  . Degeneration of thoracic or thoracolumbar intervertebral disc 12/24/2011  . Spinal stenosis, lumbar region, without neurogenic claudication 12/24/2011  . Other abnormal blood chemistry 12/24/2011  . Unspecified hypothyroidism 12/23/2011  . Acute posthemorrhagic anemia 12/23/2011  . Anemia 03/06/2015    Past Surgical History  Procedure Laterality Date  . Foot surgery    . Compression hip screw  12/17/2011    Procedure: COMPRESSION HIP;  Surgeon: Johnny Bridge, MD;  Location: [facility]S;  Service: Orthopedics;   Laterality: Left;    Allergies  Allergen Reactions  . Clindamycin/Lincomycin     Developed C. dificile  . Horse-Derived Products     Horse serum  . Mold Extract [Trichophyton]       Medication List       This list is accurate as of: 10/24/15  5:09 PM.  Always use your most recent med list.               aspirin 81 MG tablet  Take 81 mg by mouth daily.     clobetasol cream 0.05 %  Commonly known as:  TEMOVATE  APPLY TO AFFECTED AREA EVERY DAY     denosumab 60 MG/ML Soln injection  Commonly known as:  PROLIA  Inject 60 mg into the skin every 6 (six) months. Administer in upper arm, thigh, or abdomen     feeding supplement (ENSURE ENLIVE) Liqd  Take 237 mLs by mouth 2 (two) times daily between meals.     finasteride 5 MG tablet  Commonly known as:  PROSCAR  Take 5 mg by mouth daily.     furosemide 20 MG tablet  Commonly known as:  LASIX  Take 10 mg by mouth daily. Reported on 08/01/2015     levothyroxine 137 MCG tablet  Commonly known as:  SYNTHROID, LEVOTHROID  Take one tablet by mouth 30 minutes before breakfast for thyroid.     losartan 50 MG tablet  Commonly known as:  COZAAR  Take 1/2 tablet by mouth once daily to strengthen heart and  to control blood pressure     multivitamin tablet  Take 1 tablet by mouth daily.     Omega-3 300 MG Caps  Take 1 capsule by mouth daily.     Resveratrol 100 MG Caps  Take 1 capsule by mouth daily.     tamsulosin 0.4 MG Caps capsule  Commonly known as:  FLOMAX  Take 0.4 mg by mouth daily.     triamcinolone cream 0.1 %  Commonly known as:  KENALOG  Apply to  Lower legs twice daily     VITAMIN B COMPLEX PO  Take 1 tablet by mouth daily.     vitamin C 500 MG tablet  Commonly known as:  ASCORBIC ACID  Take 500 mg by mouth daily.     Vitamin D 2000 units tablet  Take 2,000 Units by mouth daily.     vitamin E 400 UNIT capsule  Generic drug:  vitamin E  Take 400 Units by mouth daily.        Review of Systems:   Review of Systems  Constitutional: Negative for fever, chills and diaphoresis.  HENT: Positive for hearing loss. Negative for congestion, ear discharge, ear pain, nosebleeds and tinnitus.   Eyes: Negative for photophobia, pain, discharge and redness.  Respiratory: Negative for cough, shortness of breath, wheezing and stridor.        Hoarse  Cardiovascular: Positive for leg swelling. Negative for chest pain.       1+ edema BLE  Gastrointestinal: Positive for diarrhea. Negative for nausea, vomiting, abdominal pain, constipation and blood in stool.       FOBT negative. External hemorrhoids.   Endocrine: Negative for polydipsia.  Genitourinary: Positive for frequency. Negative for dysuria, urgency, hematuria and flank pain.  Musculoskeletal: Positive for back pain. Negative for myalgias and neck pain.       R sciatica nerve pain.  History of OP. Treated with Prolia. Ambulates with walker, c/o not walking straight, off balance.   Skin: Negative for rash.       Spontaneous echhymosis  Allergic/Immunologic: Negative for environmental allergies.  Neurological: Negative for dizziness, tremors, seizures, weakness and headaches.  Hematological: Bruises/bleeds easily.       Limbs  Psychiatric/Behavioral: Negative for suicidal ideas and hallucinations. The patient is not nervous/anxious.     Health Maintenance  Topic Date Due  . TETANUS/TDAP  04/06/1939  . ZOSTAVAX  04/05/1980  . PNA vac Low Risk Adult (2 of 2 - PCV13) 05/17/2012  . INFLUENZA VACCINE  03/17/2016  . DEXA SCAN  05/14/2016    Physical Exam: Filed Vitals:   10/24/15 1533  Pulse: 65  Temp: 97.9 F (36.6 C)  TempSrc: Oral  Height: 5\' 7"  (1.702 m)  Weight: 140 lb (63.504 kg)  SpO2: 96%   Body mass index is 21.92 kg/(m^2). Physical Exam  Constitutional: He is oriented to person, place, and time. He appears well-developed and well-nourished. No distress.  HENT:  Head: Normocephalic and atraumatic.  Right Ear: External  ear normal. A middle ear effusion is present.  Left Ear: External ear normal.  No middle ear effusion.  Nose: Nose normal.  Mouth/Throat: Oropharynx is clear and moist. No oropharyngeal exudate.  Eyes: Conjunctivae and EOM are normal. Pupils are equal, round, and reactive to light. Right eye exhibits no discharge. Left eye exhibits no discharge. No scleral icterus.  Neck: Normal range of motion. Neck supple. No JVD present. No tracheal deviation present. No thyromegaly present.  Cardiovascular: Normal rate, regular rhythm, normal heart  sounds and intact distal pulses.   No murmur heard. Pulmonary/Chest: Effort normal and breath sounds normal. No stridor. No respiratory distress. He has no wheezes. He has no rales. He exhibits no tenderness.  Hoarse  Abdominal: Soft. Bowel sounds are normal. He exhibits no distension. There is no tenderness. There is no rebound and no guarding.  External hemorrhoids.   Genitourinary: Guaiac negative stool.  Musculoskeletal: Normal range of motion. He exhibits edema. He exhibits no tenderness.  1+ edema BLE  Lymphadenopathy:    He has no cervical adenopathy.  Neurological: He is alert and oriented to person, place, and time. He has normal reflexes. No cranial nerve deficit. He exhibits normal muscle tone. Coordination normal.  Skin: Skin is warm and dry. No rash noted. He is not diaphoretic. No erythema. No pallor.  Spontaneous echhymosis.  Psychiatric: He has a normal mood and affect. His behavior is normal. Judgment and thought content normal.  Stated his appetite has been poor since his wife passed away    Labs reviewed: Basic Metabolic Panel:  Recent Labs  02/27/15 0410 03/06/15 1539 06/11/15 10/15/15  NA 134* 141 139 139  K 3.3* 5.0 4.8 4.4  CL 102 98 101  --   CO2 27 27 26   --   GLUCOSE 101* 94 158*  --   BUN 20 29 17  24*  CREATININE 0.93 0.98 0.85 1.1  CALCIUM 8.4* 9.9 9.6  --   TSH  --   --  1.430 1.47   Liver Function Tests:  Recent  Labs  02/25/15 1640 02/26/15 0513 06/11/15 10/15/15  AST 27 18 23 20   ALT 18 15* 12 12  ALKPHOS 46 38 50 38  BILITOT 0.7 0.7 0.3  --   PROT 6.6 5.3* 6.2  --   ALBUMIN 3.4* 2.8* 4.0  --    No results for input(s): LIPASE, AMYLASE in the last 8760 hours. No results for input(s): AMMONIA in the last 8760 hours. CBC:  Recent Labs  02/26/15 0513 02/27/15 0410 03/06/15 1539 06/11/15 10/15/15  WBC 9.1 7.3 7.1 6.3 6.3  NEUTROABS 6.5  --  4.2 3.6  --   HGB 9.4* 9.6*  --   --  11.3*  HCT 27.8* 28.5* 31.6* 34.0* 34*  MCV 98.6 100.0 100* 103*  --   PLT 205 247  --   --  226   Lipid Panel: No results for input(s): CHOL, HDL, LDLCALC, TRIG, CHOLHDL, LDLDIRECT in the last 8760 hours. No results found for: HGBA1C  Procedures since last visit: No results found.  Assessment/Plan  Abnormality of gait PT to eval and tx for gait and balance, may consider Neuro consult or CT head if no better.   Anemia Hgb 9s 02/2015. 10/15/15 Hgb 11.3  BPH (benign prostatic hyperplasia) No urinary retention, continue Tamsulosin and Finasteride. 1-2x/night bathroom trips. 10/23/14 Urology: asymptomatic f/u 1 year  Edema Trace edema BLE, no SOB or cough, continue Furosemide 10mg  daily, Na 139, K 4.4, Bun 24, creat 1.08 10/15/15  HTN (hypertension) Controlled on Losartan 25mg ,   Hypokalemia 10/15/15 K 4.4  Hypothyroidism 10/19/14 TSH 2.23, 10/15/15 TSH 1.47, continue Levothyroxine 121mcg daily    Labs/tests ordered: PT to eval and tx for balance and gait.   Next appt:  12/10/2015

## 2015-10-24 NOTE — Assessment & Plan Note (Signed)
PT to eval and tx for gait and balance, may consider Neuro consult or CT head if no better.

## 2015-10-24 NOTE — Assessment & Plan Note (Signed)
Hgb 9s 02/2015. 10/15/15 Hgb 11.3 

## 2015-10-30 DIAGNOSIS — M545 Low back pain: Secondary | ICD-10-CM | POA: Diagnosis not present

## 2015-10-30 DIAGNOSIS — M6281 Muscle weakness (generalized): Secondary | ICD-10-CM | POA: Diagnosis not present

## 2015-10-30 DIAGNOSIS — R2681 Unsteadiness on feet: Secondary | ICD-10-CM | POA: Diagnosis not present

## 2015-10-30 DIAGNOSIS — M79604 Pain in right leg: Secondary | ICD-10-CM | POA: Diagnosis not present

## 2015-11-01 DIAGNOSIS — M79604 Pain in right leg: Secondary | ICD-10-CM | POA: Diagnosis not present

## 2015-11-01 DIAGNOSIS — M545 Low back pain: Secondary | ICD-10-CM | POA: Diagnosis not present

## 2015-11-01 DIAGNOSIS — M6281 Muscle weakness (generalized): Secondary | ICD-10-CM | POA: Diagnosis not present

## 2015-11-01 DIAGNOSIS — R2681 Unsteadiness on feet: Secondary | ICD-10-CM | POA: Diagnosis not present

## 2015-11-04 DIAGNOSIS — M545 Low back pain: Secondary | ICD-10-CM | POA: Diagnosis not present

## 2015-11-04 DIAGNOSIS — R2681 Unsteadiness on feet: Secondary | ICD-10-CM | POA: Diagnosis not present

## 2015-11-04 DIAGNOSIS — M79604 Pain in right leg: Secondary | ICD-10-CM | POA: Diagnosis not present

## 2015-11-04 DIAGNOSIS — M6281 Muscle weakness (generalized): Secondary | ICD-10-CM | POA: Diagnosis not present

## 2015-11-06 DIAGNOSIS — R2681 Unsteadiness on feet: Secondary | ICD-10-CM | POA: Diagnosis not present

## 2015-11-06 DIAGNOSIS — M545 Low back pain: Secondary | ICD-10-CM | POA: Diagnosis not present

## 2015-11-06 DIAGNOSIS — M79604 Pain in right leg: Secondary | ICD-10-CM | POA: Diagnosis not present

## 2015-11-06 DIAGNOSIS — M6281 Muscle weakness (generalized): Secondary | ICD-10-CM | POA: Diagnosis not present

## 2015-11-11 DIAGNOSIS — M545 Low back pain: Secondary | ICD-10-CM | POA: Diagnosis not present

## 2015-11-11 DIAGNOSIS — M6281 Muscle weakness (generalized): Secondary | ICD-10-CM | POA: Diagnosis not present

## 2015-11-11 DIAGNOSIS — R2681 Unsteadiness on feet: Secondary | ICD-10-CM | POA: Diagnosis not present

## 2015-11-11 DIAGNOSIS — M79604 Pain in right leg: Secondary | ICD-10-CM | POA: Diagnosis not present

## 2015-11-13 DIAGNOSIS — M79604 Pain in right leg: Secondary | ICD-10-CM | POA: Diagnosis not present

## 2015-11-13 DIAGNOSIS — M6281 Muscle weakness (generalized): Secondary | ICD-10-CM | POA: Diagnosis not present

## 2015-11-13 DIAGNOSIS — R2681 Unsteadiness on feet: Secondary | ICD-10-CM | POA: Diagnosis not present

## 2015-11-13 DIAGNOSIS — M545 Low back pain: Secondary | ICD-10-CM | POA: Diagnosis not present

## 2015-11-15 DIAGNOSIS — M6281 Muscle weakness (generalized): Secondary | ICD-10-CM | POA: Diagnosis not present

## 2015-11-15 DIAGNOSIS — M79604 Pain in right leg: Secondary | ICD-10-CM | POA: Diagnosis not present

## 2015-11-15 DIAGNOSIS — M545 Low back pain: Secondary | ICD-10-CM | POA: Diagnosis not present

## 2015-11-15 DIAGNOSIS — R2681 Unsteadiness on feet: Secondary | ICD-10-CM | POA: Diagnosis not present

## 2015-11-18 DIAGNOSIS — R2681 Unsteadiness on feet: Secondary | ICD-10-CM | POA: Diagnosis not present

## 2015-11-18 DIAGNOSIS — M6281 Muscle weakness (generalized): Secondary | ICD-10-CM | POA: Diagnosis not present

## 2015-11-18 DIAGNOSIS — M79604 Pain in right leg: Secondary | ICD-10-CM | POA: Diagnosis not present

## 2015-11-18 DIAGNOSIS — M545 Low back pain: Secondary | ICD-10-CM | POA: Diagnosis not present

## 2015-11-20 DIAGNOSIS — R2681 Unsteadiness on feet: Secondary | ICD-10-CM | POA: Diagnosis not present

## 2015-11-20 DIAGNOSIS — M545 Low back pain: Secondary | ICD-10-CM | POA: Diagnosis not present

## 2015-11-20 DIAGNOSIS — M6281 Muscle weakness (generalized): Secondary | ICD-10-CM | POA: Diagnosis not present

## 2015-11-20 DIAGNOSIS — M79604 Pain in right leg: Secondary | ICD-10-CM | POA: Diagnosis not present

## 2015-11-22 DIAGNOSIS — M6281 Muscle weakness (generalized): Secondary | ICD-10-CM | POA: Diagnosis not present

## 2015-11-22 DIAGNOSIS — M545 Low back pain: Secondary | ICD-10-CM | POA: Diagnosis not present

## 2015-11-22 DIAGNOSIS — R2681 Unsteadiness on feet: Secondary | ICD-10-CM | POA: Diagnosis not present

## 2015-11-22 DIAGNOSIS — M79604 Pain in right leg: Secondary | ICD-10-CM | POA: Diagnosis not present

## 2015-11-25 DIAGNOSIS — M6281 Muscle weakness (generalized): Secondary | ICD-10-CM | POA: Diagnosis not present

## 2015-11-25 DIAGNOSIS — R2681 Unsteadiness on feet: Secondary | ICD-10-CM | POA: Diagnosis not present

## 2015-11-25 DIAGNOSIS — M545 Low back pain: Secondary | ICD-10-CM | POA: Diagnosis not present

## 2015-11-25 DIAGNOSIS — M79604 Pain in right leg: Secondary | ICD-10-CM | POA: Diagnosis not present

## 2015-11-27 DIAGNOSIS — M6281 Muscle weakness (generalized): Secondary | ICD-10-CM | POA: Diagnosis not present

## 2015-11-27 DIAGNOSIS — M79604 Pain in right leg: Secondary | ICD-10-CM | POA: Diagnosis not present

## 2015-11-27 DIAGNOSIS — R2681 Unsteadiness on feet: Secondary | ICD-10-CM | POA: Diagnosis not present

## 2015-11-27 DIAGNOSIS — M545 Low back pain: Secondary | ICD-10-CM | POA: Diagnosis not present

## 2015-11-29 DIAGNOSIS — R2681 Unsteadiness on feet: Secondary | ICD-10-CM | POA: Diagnosis not present

## 2015-11-29 DIAGNOSIS — M79604 Pain in right leg: Secondary | ICD-10-CM | POA: Diagnosis not present

## 2015-11-29 DIAGNOSIS — M545 Low back pain: Secondary | ICD-10-CM | POA: Diagnosis not present

## 2015-11-29 DIAGNOSIS — M6281 Muscle weakness (generalized): Secondary | ICD-10-CM | POA: Diagnosis not present

## 2015-12-02 DIAGNOSIS — R2681 Unsteadiness on feet: Secondary | ICD-10-CM | POA: Diagnosis not present

## 2015-12-02 DIAGNOSIS — M545 Low back pain: Secondary | ICD-10-CM | POA: Diagnosis not present

## 2015-12-02 DIAGNOSIS — M6281 Muscle weakness (generalized): Secondary | ICD-10-CM | POA: Diagnosis not present

## 2015-12-02 DIAGNOSIS — M79604 Pain in right leg: Secondary | ICD-10-CM | POA: Diagnosis not present

## 2015-12-04 DIAGNOSIS — R3915 Urgency of urination: Secondary | ICD-10-CM | POA: Diagnosis not present

## 2015-12-04 DIAGNOSIS — Z0000 Encounter for general adult medical examination without abnormal findings: Secondary | ICD-10-CM | POA: Diagnosis not present

## 2015-12-04 DIAGNOSIS — M6281 Muscle weakness (generalized): Secondary | ICD-10-CM | POA: Diagnosis not present

## 2015-12-04 DIAGNOSIS — N138 Other obstructive and reflux uropathy: Secondary | ICD-10-CM | POA: Diagnosis not present

## 2015-12-04 DIAGNOSIS — N401 Enlarged prostate with lower urinary tract symptoms: Secondary | ICD-10-CM | POA: Diagnosis not present

## 2015-12-04 DIAGNOSIS — M545 Low back pain: Secondary | ICD-10-CM | POA: Diagnosis not present

## 2015-12-04 DIAGNOSIS — R2681 Unsteadiness on feet: Secondary | ICD-10-CM | POA: Diagnosis not present

## 2015-12-04 DIAGNOSIS — M79604 Pain in right leg: Secondary | ICD-10-CM | POA: Diagnosis not present

## 2015-12-06 DIAGNOSIS — M545 Low back pain: Secondary | ICD-10-CM | POA: Diagnosis not present

## 2015-12-06 DIAGNOSIS — M79604 Pain in right leg: Secondary | ICD-10-CM | POA: Diagnosis not present

## 2015-12-06 DIAGNOSIS — M6281 Muscle weakness (generalized): Secondary | ICD-10-CM | POA: Diagnosis not present

## 2015-12-06 DIAGNOSIS — R2681 Unsteadiness on feet: Secondary | ICD-10-CM | POA: Diagnosis not present

## 2015-12-09 DIAGNOSIS — R2681 Unsteadiness on feet: Secondary | ICD-10-CM | POA: Diagnosis not present

## 2015-12-09 DIAGNOSIS — M79604 Pain in right leg: Secondary | ICD-10-CM | POA: Diagnosis not present

## 2015-12-09 DIAGNOSIS — M6281 Muscle weakness (generalized): Secondary | ICD-10-CM | POA: Diagnosis not present

## 2015-12-09 DIAGNOSIS — M545 Low back pain: Secondary | ICD-10-CM | POA: Diagnosis not present

## 2015-12-10 ENCOUNTER — Ambulatory Visit: Payer: 59 | Admitting: Internal Medicine

## 2015-12-11 DIAGNOSIS — M6281 Muscle weakness (generalized): Secondary | ICD-10-CM | POA: Diagnosis not present

## 2015-12-11 DIAGNOSIS — M79604 Pain in right leg: Secondary | ICD-10-CM | POA: Diagnosis not present

## 2015-12-11 DIAGNOSIS — R2681 Unsteadiness on feet: Secondary | ICD-10-CM | POA: Diagnosis not present

## 2015-12-11 DIAGNOSIS — M545 Low back pain: Secondary | ICD-10-CM | POA: Diagnosis not present

## 2016-02-05 DIAGNOSIS — D239 Other benign neoplasm of skin, unspecified: Secondary | ICD-10-CM | POA: Diagnosis not present

## 2016-02-05 DIAGNOSIS — D045 Carcinoma in situ of skin of trunk: Secondary | ICD-10-CM | POA: Diagnosis not present

## 2016-02-05 DIAGNOSIS — L821 Other seborrheic keratosis: Secondary | ICD-10-CM | POA: Diagnosis not present

## 2016-02-05 DIAGNOSIS — L219 Seborrheic dermatitis, unspecified: Secondary | ICD-10-CM | POA: Diagnosis not present

## 2016-04-23 ENCOUNTER — Encounter: Payer: Self-pay | Admitting: Nurse Practitioner

## 2016-04-23 ENCOUNTER — Non-Acute Institutional Stay: Payer: Medicare Other | Admitting: Nurse Practitioner

## 2016-04-23 DIAGNOSIS — I10 Essential (primary) hypertension: Secondary | ICD-10-CM | POA: Diagnosis not present

## 2016-04-23 DIAGNOSIS — R269 Unspecified abnormalities of gait and mobility: Secondary | ICD-10-CM

## 2016-04-23 DIAGNOSIS — N400 Enlarged prostate without lower urinary tract symptoms: Secondary | ICD-10-CM

## 2016-04-23 DIAGNOSIS — E039 Hypothyroidism, unspecified: Secondary | ICD-10-CM

## 2016-04-23 DIAGNOSIS — M810 Age-related osteoporosis without current pathological fracture: Secondary | ICD-10-CM | POA: Diagnosis not present

## 2016-04-23 DIAGNOSIS — R609 Edema, unspecified: Secondary | ICD-10-CM | POA: Diagnosis not present

## 2016-04-23 NOTE — Assessment & Plan Note (Signed)
10/19/14 TSH 2.23, 10/15/15 TSH 1.47, continue Levothyroxine 166mcg daily

## 2016-04-23 NOTE — Progress Notes (Signed)
Patient ID: Cory Burton, male   DOB: August 13, 1918, 82 y.o.   MRN: [id number]   Location:   [facility]   Place of Service:  Clinic (12)   Provider: Marlana Latus NP  Code Status: DNR Goals of Care:  Advanced Directives 04/23/2016  Does patient have an advance directive? Yes  Type of Paramedic of [place];Living will  Does patient want to make changes to advanced directive? No - Patient declined  Copy of advanced directive(s) in chart? Yes  Pre-existing out of facility DNR order (yellow form or pink MOST form) -     Chief Complaint  Patient presents with  . Medical Management of Chronic Issues    6 mo f/u    HPI: Patient is a 82 y.o. male seen today for medical management of chronic diseases.  Hx of BPH, no urinary retention while on Finasteride and Tamsulosin. Chronic BLE edema, denied cough or paroxysmal nocturnal orthopnea, takes Furosemide 20mg  daily, Bun/creat 24/1.08 10/15/15, hypothyroidism, takes Levothyroxine 170mcg, last TSH 1.47 10/15/15. His blood pressure is controlled on Losartan 50mg . 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    Past Medical History:  Diagnosis Date  . Acute posthemorrhagic anemia 12/23/2011  . Anemia 03/06/2015  . BPH (benign prostatic hyperplasia)   . Cardiomegaly 12/24/2011  . Degeneration of thoracic or thoracolumbar intervertebral disc 12/24/2011  . Edema 04/28/2012  . Fracture, intertrochanteric, left femur ([place]) 12/17/2011  . Hypertension   . Other abnormal blood chemistry 12/24/2011  . Senile osteoporosis 08/25/2012  . Spinal stenosis, lumbar region, without neurogenic claudication 12/24/2011  . Thyroid disease   . Unspecified hearing loss 12/24/2011  . Unspecified hypothyroidism 12/23/2011    Past Surgical History:  Procedure Laterality Date  . COMPRESSION HIP SCREW  12/17/2011   Procedure: COMPRESSION HIP;  Surgeon: Johnny Bridge, MD;  Location: [facility];  Service: Orthopedics;  Laterality: Left;    . FOOT SURGERY      Allergies  Allergen Reactions  . Clindamycin/Lincomycin     Developed C. dificile  . Horse-Derived Products     Horse serum  . Mold Extract [Trichophyton]       Medication List       Accurate as of 04/23/16  2:26 PM. Always use your most recent med list.          aspirin 81 MG tablet Take 81 mg by mouth daily.   clobetasol cream 0.05 % Commonly known as:  TEMOVATE APPLY TO AFFECTED AREA EVERY DAY   feeding supplement (ENSURE ENLIVE) Liqd Take 237 mLs by mouth 2 (two) times daily between meals.   finasteride 5 MG tablet Commonly known as:  PROSCAR Take 5 mg by mouth daily.   fluocinonide 0.05 % external solution Commonly known as:  LIDEX Apply 1 application topically daily.   furosemide 20 MG tablet Commonly known as:  LASIX Take 10 mg by mouth daily. Reported on 08/01/2015   levothyroxine 137 MCG tablet Commonly known as:  SYNTHROID, LEVOTHROID Take one tablet by mouth 30 minutes before breakfast for thyroid.   losartan 50 MG tablet Commonly known as:  COZAAR Take 1/2 tablet by mouth once daily to strengthen heart and to control blood pressure   multivitamin tablet Take 1 tablet by mouth daily.   Omega-3 300 MG Caps Take 1 capsule by mouth daily.   Resveratrol 100 MG Caps Take 1 capsule by mouth daily.   tamsulosin 0.4 MG Caps capsule Commonly  known as:  FLOMAX Take 0.4 mg by mouth daily.   VITAMIN B COMPLEX PO Take 1 tablet by mouth daily.   vitamin C 500 MG tablet Commonly known as:  ASCORBIC ACID Take 500 mg by mouth daily.   Vitamin D 2000 units tablet Take 2,000 Units by mouth daily.   vitamin E 400 UNIT capsule Generic drug:  vitamin E Take 400 Units by mouth daily.       Review of Systems:  Review of Systems  Constitutional: Negative for chills, diaphoresis and fever.  HENT: Positive for hearing loss. Negative for congestion, ear discharge, ear pain, nosebleeds and tinnitus.   Eyes: Negative for  photophobia, pain, discharge and redness.  Respiratory: Negative for cough, shortness of breath, wheezing and stridor.        Hoarse  Cardiovascular: Positive for leg swelling. Negative for chest pain.       1+ edema BLE  Gastrointestinal: Positive for diarrhea. Negative for abdominal pain, blood in stool, constipation, nausea and vomiting.       FOBT negative. External hemorrhoids.   Endocrine: Negative for polydipsia.  Genitourinary: Positive for frequency. Negative for dysuria, flank pain, hematuria and urgency.  Musculoskeletal: Positive for back pain. Negative for myalgias and neck pain.       R sciatica nerve pain.  History of OP. Treated with Prolia. Ambulates with walker, not straight, last fall a few weeks ago, lost balance, left upper arm ecchymosis is resolving.   Skin: Negative for rash.       Spontaneous echhymosis  Allergic/Immunologic: Negative for environmental allergies.  Neurological: Negative for dizziness, tremors, seizures, weakness and headaches.  Hematological: Bruises/bleeds easily.       Limbs  Psychiatric/Behavioral: Negative for hallucinations and suicidal ideas. The patient is not nervous/anxious.     Health Maintenance  Topic Date Due  . TETANUS/TDAP  04/06/1939  . ZOSTAVAX  04/05/1980  . PNA vac Low Risk Adult (2 of 2 - PCV13) 05/17/2012  . INFLUENZA VACCINE  03/17/2016  . DEXA SCAN  05/14/2016    Physical Exam: Vitals:   04/23/16 1347  BP: 132/60  Pulse: 71  Resp: 20  Temp: 98.6 F (37 C)  Weight: 138 lb 12.8 oz (63 kg)  Height: 5\' 5"  (1.651 m)   Body mass index is 23.1 kg/m. Physical Exam  Constitutional: He is oriented to person, place, and time. He appears well-developed and well-nourished. No distress.  HENT:  Head: Normocephalic and atraumatic.  Right Ear: External ear normal. A middle ear effusion is present.  Left Ear: External ear normal.  No middle ear effusion.  Nose: Nose normal.  Mouth/Throat: Oropharynx is clear and moist.  No oropharyngeal exudate.  Eyes: Conjunctivae and EOM are normal. Pupils are equal, round, and reactive to light. Right eye exhibits no discharge. Left eye exhibits no discharge. No scleral icterus.  Neck: Normal range of motion. Neck supple. No JVD present. No tracheal deviation present. No thyromegaly present.  Cardiovascular: Normal rate, regular rhythm, normal heart sounds and intact distal pulses.   No murmur heard. Pulmonary/Chest: Effort normal and breath sounds normal. No stridor. No respiratory distress. He has no wheezes. He has no rales. He exhibits no tenderness.  Hoarse  Abdominal: Soft. Bowel sounds are normal. He exhibits no distension. There is no tenderness. There is no rebound and no guarding.  External hemorrhoids.   Genitourinary: Rectal exam shows guaiac negative stool.  Musculoskeletal: Normal range of motion. He exhibits edema. He exhibits no tenderness.  1+  edema BLE  Lymphadenopathy:    He has no cervical adenopathy.  Neurological: He is alert and oriented to person, place, and time. He has normal reflexes. No cranial nerve deficit. He exhibits normal muscle tone. Coordination normal.  Skin: Skin is warm and dry. No rash noted. He is not diaphoretic. No erythema. No pallor.  Spontaneous echhymosis. Left upper arm ecchymosis is resolving.   Psychiatric: He has a normal mood and affect. His behavior is normal. Judgment and thought content normal.  Stated his appetite has been poor since his wife passed away    Labs reviewed: Basic Metabolic Panel:  Recent Labs  06/11/15 0000 10/15/15  NA 139 139  K 4.8 4.4  CL 101  --   CO2 26  --   GLUCOSE 158*  --   BUN 17 24*  CREATININE 0.85 1.1  CALCIUM 9.6  --   TSH 1.430 1.47   Liver Function Tests:  Recent Labs  06/11/15 0000 10/15/15  AST 23 20  ALT 12 12  ALKPHOS 50 38  BILITOT 0.3  --   PROT 6.2  --   ALBUMIN 4.0  --    No results for input(s): LIPASE, AMYLASE in the last 8760 hours. No results for  input(s): AMMONIA in the last 8760 hours. CBC:  Recent Labs  06/11/15 0000 10/15/15  WBC 6.3 6.3  NEUTROABS 3.6  --   HGB  --  11.3*  HCT 34.0* 34*  MCV 103*  --   PLT  --  226   Lipid Panel: No results for input(s): CHOL, HDL, LDLCALC, TRIG, CHOLHDL, LDLDIRECT in the last 8760 hours. No results found for: HGBA1C  Procedures since last visit: No results found.  Assessment/Plan  HTN (hypertension) Controlled, continue Losartan 25mg ,    Hypothyroidism 10/19/14 TSH 2.23, 10/15/15 TSH 1.47, continue Levothyroxine 123mcg daily   Osteoporosis Continue Prolia  BPH (benign prostatic hyperplasia) No urinary retention, continue Tamsulosin and Finasteride. 1-2x/night bathroom trips. 10/23/14 Urology: asymptomatic f/u 1 year   Edema Trace edema BLE, no SOB or cough, continue Furosemide 10mg  daily, Na 139, K 4.4, Bun 24, creat 1.08 10/15/15   Abnormality of gait Ambulates with walker, prn PT, risk falling, last fall was a few weeks ago, lost balance in dinning room, left upper arm ecchymosis is resolving.    Labs/tests ordered: CBC, CMP, TSH, Hgb A1c prior to next appointment.   Next appt:  6 months.

## 2016-04-23 NOTE — Assessment & Plan Note (Signed)
Continue Prolia

## 2016-04-23 NOTE — Assessment & Plan Note (Addendum)
Ambulates with walker, prn PT, risk falling, last fall was a few weeks ago, lost balance in dinning room, left upper arm ecchymosis is resolving.

## 2016-04-23 NOTE — Assessment & Plan Note (Signed)
Trace edema BLE, no SOB or cough, continue Furosemide 10mg  daily, Na 139, K 4.4, Bun 24, creat 1.08 10/15/15

## 2016-04-23 NOTE — Assessment & Plan Note (Signed)
Controlled, continue Losartan 25mg ,

## 2016-04-23 NOTE — Assessment & Plan Note (Signed)
No urinary retention, continue Tamsulosin and Finasteride. 1-2x/night bathroom trips. 10/23/14 Urology: asymptomatic f/u 1 year

## 2016-05-01 ENCOUNTER — Other Ambulatory Visit: Payer: Self-pay | Admitting: Internal Medicine

## 2016-05-15 DIAGNOSIS — M810 Age-related osteoporosis without current pathological fracture: Secondary | ICD-10-CM | POA: Diagnosis not present

## 2016-05-15 LAB — HM DEXA SCAN

## 2016-05-20 ENCOUNTER — Encounter: Payer: Self-pay | Admitting: *Deleted

## 2016-05-27 DIAGNOSIS — Z23 Encounter for immunization: Secondary | ICD-10-CM | POA: Diagnosis not present

## 2016-06-15 ENCOUNTER — Emergency Department (HOSPITAL_COMMUNITY): Payer: Medicare Other

## 2016-06-15 ENCOUNTER — Encounter (HOSPITAL_COMMUNITY): Payer: Self-pay | Admitting: Family Medicine

## 2016-06-15 ENCOUNTER — Emergency Department (HOSPITAL_COMMUNITY)
Admission: EM | Admit: 2016-06-15 | Discharge: 2016-06-15 | Disposition: A | Discharge status: Home / Self Care | Payer: Medicare Other | Attending: Emergency Medicine | Admitting: Emergency Medicine

## 2016-06-15 DIAGNOSIS — S0180XA Unspecified open wound of other part of head, initial encounter: Secondary | ICD-10-CM | POA: Diagnosis not present

## 2016-06-15 DIAGNOSIS — Y929 Unspecified place or not applicable: Secondary | ICD-10-CM | POA: Diagnosis not present

## 2016-06-15 DIAGNOSIS — W0110XA Fall on same level from slipping, tripping and stumbling with subsequent striking against unspecified object, initial encounter: Secondary | ICD-10-CM | POA: Diagnosis not present

## 2016-06-15 DIAGNOSIS — S0181XA Laceration without foreign body of other part of head, initial encounter: Secondary | ICD-10-CM | POA: Insufficient documentation

## 2016-06-15 DIAGNOSIS — E039 Hypothyroidism, unspecified: Secondary | ICD-10-CM | POA: Insufficient documentation

## 2016-06-15 DIAGNOSIS — S0003XA Contusion of scalp, initial encounter: Secondary | ICD-10-CM | POA: Diagnosis not present

## 2016-06-15 DIAGNOSIS — W19XXXA Unspecified fall, initial encounter: Secondary | ICD-10-CM

## 2016-06-15 DIAGNOSIS — Y939 Activity, unspecified: Secondary | ICD-10-CM | POA: Diagnosis not present

## 2016-06-15 DIAGNOSIS — Z7982 Long term (current) use of aspirin: Secondary | ICD-10-CM | POA: Insufficient documentation

## 2016-06-15 DIAGNOSIS — Z87891 Personal history of nicotine dependence: Secondary | ICD-10-CM | POA: Insufficient documentation

## 2016-06-15 DIAGNOSIS — Z79899 Other long term (current) drug therapy: Secondary | ICD-10-CM | POA: Diagnosis not present

## 2016-06-15 DIAGNOSIS — I10 Essential (primary) hypertension: Secondary | ICD-10-CM | POA: Diagnosis not present

## 2016-06-15 DIAGNOSIS — Y999 Unspecified external cause status: Secondary | ICD-10-CM | POA: Diagnosis not present

## 2016-06-15 DIAGNOSIS — S0990XA Unspecified injury of head, initial encounter: Secondary | ICD-10-CM | POA: Diagnosis not present

## 2016-06-15 MED ORDER — LIDOCAINE-EPINEPHRINE (PF) 2 %-1:200000 IJ SOLN
10.0000 mL | Freq: Once | INTRAMUSCULAR | Status: AC
  Administered 2016-06-15: 10 mL
  Filled 2016-06-15: qty 20

## 2016-06-15 NOTE — ED Provider Notes (Signed)
[facility] DEPT Provider Note   CSN: [id number] Arrival date & time: 06/15/16  1807  History   Chief Complaint Chief Complaint  Patient presents with  . Fall    HPI Cory Burton is a 82 y.o. male.  HPI  82 y.o. male with a hx of HTN, presents to the Emergency Department today s/p mechanical fall. [place] and [place] on uneven brick. Notes head trauma with no LOC. Laceration noted on forehead. EMS notified and placed gauze over wound. Notes no headaches. NO vision changes. No Numbness/tingling. No neck pain. No CP/SOB/ABD pain. No N/V. Pt does not take any blood thinners. No other symptoms noted.   Past Medical History:  Diagnosis Date  . Acute posthemorrhagic anemia 12/23/2011  . Anemia 03/06/2015  . BPH (benign prostatic hyperplasia)   . Cardiomegaly 12/24/2011  . Degeneration of thoracic or thoracolumbar intervertebral disc 12/24/2011  . Edema 04/28/2012  . Fracture, intertrochanteric, left femur ([place]) 12/17/2011  . Hypertension   . Other abnormal blood chemistry 12/24/2011  . Senile osteoporosis 08/25/2012  . Spinal stenosis, lumbar region, without neurogenic claudication 12/24/2011  . Thyroid disease   . Unspecified hearing loss 12/24/2011  . Unspecified hypothyroidism 12/23/2011    Patient Active Problem List   Diagnosis Date Noted  . Abnormality of gait 10/24/2015  . Grief 06/11/2015  . Anemia 03/06/2015  . Hypokalemia   . C. difficile colitis 02/26/2015  . Osteoporosis 10/25/2014  . Sciatica of right side 04/26/2014  . HTN (hypertension) 04/28/2013  . Edema 04/28/2013  . BPH (benign prostatic hyperplasia) 04/28/2013  . Hypothyroidism 04/28/2013  . Fracture, intertrochanteric, left femur ([place]) 12/17/2011    Past Surgical History:  Procedure Laterality Date  . COMPRESSION HIP SCREW  12/17/2011   Procedure: COMPRESSION HIP;  Surgeon: Johnny Bridge, MD;  Location: [facility];  Service: Orthopedics;  Laterality: Left;  . FOOT SURGERY          Home Medications    Prior to Admission medications   Medication Sig Start Date End Date Taking? Authorizing Provider  aspirin 81 MG tablet Take 81 mg by mouth daily.    Historical Provider, MD  B Complex Vitamins (VITAMIN B COMPLEX PO) Take 1 tablet by mouth daily.     Historical Provider, MD  Cholecalciferol (VITAMIN D) 2000 UNITS tablet Take 2,000 Units by mouth daily.    Historical Provider, MD  clobetasol cream (TEMOVATE) 0.05 % APPLY TO AFFECTED AREA EVERY DAY 06/04/15   Historical Provider, MD  feeding supplement, ENSURE ENLIVE, (ENSURE ENLIVE) LIQD Take 237 mLs by mouth 2 (two) times daily between meals. 02/27/15   Nishant Dhungel, MD  finasteride (PROSCAR) 5 MG tablet Take 5 mg by mouth daily.    Historical Provider, MD  fluocinonide (LIDEX) 0.05 % external solution Apply 1 application topically daily.    Historical Provider, MD  furosemide (LASIX) 20 MG tablet Take 10 mg by mouth daily. Reported on 08/01/2015    Historical Provider, MD  levothyroxine (SYNTHROID, LEVOTHROID) 137 MCG tablet Take one tablet by mouth 30 minutes before breakfast for thyroid. 07/16/15   Estill Dooms, MD  losartan (COZAAR) 50 MG tablet Take 1/2 tablet by mouth once daily to strengthen heart and to control blood pressure 04/16/15   Estill Dooms, MD  losartan (COZAAR) 50 MG tablet TAKE 1/2 TABLET DAILY TO   STRENGTHEN HEART AND       CONTROL BLOOD PRESSURE 05/01/16   Estill Dooms,  MD  Multiple Vitamin (MULTIVITAMIN) tablet Take 1 tablet by mouth daily.    Historical Provider, MD  Omega-3 300 MG CAPS Take 1 capsule by mouth daily.     Historical Provider, MD  Resveratrol 100 MG CAPS Take 1 capsule by mouth daily.     Historical Provider, MD  Tamsulosin HCl (FLOMAX) 0.4 MG CAPS Take 0.4 mg by mouth daily.    Historical Provider, MD  vitamin C (ASCORBIC ACID) 500 MG tablet Take 500 mg by mouth daily.    Historical Provider, MD  vitamin E (VITAMIN E) 400 UNIT capsule Take 400 Units by mouth daily.     Historical Provider, MD    Family History Family History  Problem Relation Age of Onset  . Cancer Mother     Social History Social History  Substance Use Topics  . Smoking status: Former Smoker    Types: Pipe    Quit date: 04/27/1985  . Smokeless tobacco: Never Used  . Alcohol use 4.2 oz/week    7 Glasses of wine per week     Comment: 1-2 glasses a day     Allergies   Clindamycin/lincomycin; Horse-derived products; and Mold extract [trichophyton]   Review of Systems Review of Systems ROS reviewed and all are negative for acute change except as noted in the HPI.  Physical Exam Updated Vital Signs BP 172/87   Pulse 72   Temp 99 F (37.2 C)   Resp 16   Wt 61.2 kg   SpO2 100%   BMI 22.47 kg/m   Physical Exam  Constitutional: He is oriented to person, place, and time. Vital signs are normal. He appears well-developed and well-nourished.  HENT:  Head: Normocephalic. Head is with laceration.  Right Ear: Hearing normal.  Left Ear: Hearing normal.  Nose: Nose lacerations and nasal deformity present. No sinus tenderness, septal deviation or nasal septal hematoma.  Superficial 1cm laceration on bridge of nose. Mild deformity noted. Nasal passages patent. 1cm superficial laceration noted on scalp. Hematoma present.   Eyes: Conjunctivae and EOM are normal. Pupils are equal, round, and reactive to light.  Neck: Normal range of motion. Neck supple.  Cardiovascular: Normal rate, regular rhythm, normal heart sounds and intact distal pulses.   Pulmonary/Chest: Effort normal and breath sounds normal.  Neurological: He is alert and oriented to person, place, and time. He has normal strength. No cranial nerve deficit or sensory deficit.  Cranial Nerves:  II: Pupils equal, round, reactive to light III,IV, VI: ptosis not present, extra-ocular motions intact bilaterally  V,VII: smile symmetric, facial light touch sensation equal VIII: hearing grossly normal bilaterally  IX,X:  midline uvula rise  XI: bilateral shoulder shrug equal and strong XII: midline tongue extension  Skin: Skin is warm and dry.  Psychiatric: He has a normal mood and affect. His speech is normal and behavior is normal. Thought content normal.  Nursing note and vitals reviewed.  ED Treatments / Results  Labs (all labs ordered are listed, but only abnormal results are displayed) Labs Reviewed - No data to display  EKG  EKG Interpretation None      Radiology Ct Head Wo Contrast  Result Date: 06/15/2016 CLINICAL DATA:  Fall striking head. Laceration along the left forehead. EXAM: CT HEAD WITHOUT CONTRAST TECHNIQUE: Contiguous axial images were obtained from the base of the skull through the vertex without intravenous contrast. COMPARISON:  None. FINDINGS: Brain: Symmetric calcifications in the cerebellum and lentiform nuclei it also with some linear calcifications in the periventricular  white matter and corona radiata, most typically physiologic although those could seen in the setting of Fahr disease, os a processes such as carbon monoxide poisoning ; parathyroid disorder; and in some other conditions. Periventricular white matter and corona radiata hypodensities favor chronic ischemic microvascular white matter disease. No intracranial hemorrhage, mass lesion, or acute CVA. Vascular: There is atherosclerotic calcification of the cavernous carotid arteries bilaterally. Skull: Unremarkable Sinuses/Orbits: Unremarkable Other: Left upper periorbital hematoma extending into the left forehead/scalp. IMPRESSION: 1. Left frontal forehead and upper periorbital scalp and facial hematoma. No intraorbital abnormality or underlying bony abnormality. 2. No acute intracranial findings. 3. Calcifications along the dentate nuclei of the cerebellum, the lentiform nucleus of the basal ganglia, and faint linear calcifications in the periventricular white matter and corona radiata. Although most likely benign  physiologic calcifications, other entities the could be associated with calcifications in a similar pattern include Fahr disease, toxic processes such as carbon monoxide poisoning; parathyroid disorders ; and certain other inherited and infectious conditions. Electronically Signed   By: Van Clines M.D.   On: 06/15/2016 20:22    Procedures .Marland KitchenLaceration Repair Date/Time: 06/15/2016 8:45 PM Performed by: Shary Decamp Authorized by: Shary Decamp   Consent:    Consent obtained:  Verbal   Consent given by:  Patient   Alternatives discussed:  Delayed treatment Laceration details:    Location:  Scalp   Scalp location:  Frontal   Length (cm):  1 Repair type:    Repair type:  Simple Exploration:    Hemostasis achieved with:  Direct pressure   Wound exploration: wound explored through full range of motion   Treatment:    Area cleansed with:  Hibiclens   Amount of cleaning:  Extensive   Irrigation solution:  Sterile saline   Irrigation method:  Syringe Skin repair:    Repair method:  Sutures   Suture size:  4-0   Suture material:  Prolene   Suture technique:  Simple interrupted   Number of sutures:  3 Approximation:    Approximation:  Close   Vermilion border: well-aligned   Post-procedure details:    Dressing:  Antibiotic ointment and non-adherent dressing   Patient tolerance of procedure:  Tolerated well, no immediate complications   (including critical care time)  Medications Ordered in ED Medications  lidocaine-EPINEPHrine (XYLOCAINE W/EPI) 2 %-1:200000 (PF) injection 10 mL (not administered)   Initial Impression / Assessment and Plan / ED Course  I have reviewed the triage vital signs and the nursing notes.  Pertinent labs & imaging results that were available during my care of the patient were reviewed by me and considered in my medical decision making (see chart for details).  Clinical Course   Final Clinical Impressions(s) / ED Diagnoses  I have reviewed and  evaluated the relevant imaging studies.  I have reviewed the relevant previous healthcare records. I obtained HPI from historian. Patient discussed with supervising physician  ED Course:  Assessment: Patient is a 89yM that presents with laceration to scalp s/p mechanical fall. No blood thinners. Tdap UTD. Pressure irrigation performed. Bottom of the wound visualized with bleeding controled. Laceration occurred < 8 hours prior to repair which was well tolerated. Pt has no co morbidities to effect normal wound healing. Discussed suture home care w pt and answered questions. Pt to f-u for wound check and suture removal in 5-7 days. Mild nasal deformity noted. Nasal passages patent. CT head unremarkable for acute intracranial abnormalities. Pt is hemodynamically stable w no complaints prior to  dc.    Disposition/Plan:  DC Home Additional Verbal discharge instructions given and discussed with patient.  Pt Instructed to f/u with PCP in the next week for evaluation and treatment of symptoms. Return precautions given Pt acknowledges and agrees with plan  Supervising Physician Leo Grosser, MD   Final diagnoses:  Fall, initial encounter  Laceration of forehead, initial encounter    New Prescriptions New Prescriptions   No medications on file     Shary Decamp, PA-C 06/15/16 2052    Leo Grosser, MD 06/16/16 [phone]

## 2016-06-15 NOTE — ED Triage Notes (Signed)
[place] [facility]. Patient lives at [facility] but [place]. It was uneven ground, he tripped and fell. EMS denies any LOC, neck or back pain. EMS reports he has a minor laceration above his left eyebrow with bleeding controlled by a pressure dressing and a laceration to the bridge of his nose.

## 2016-06-15 NOTE — Discharge Instructions (Signed)
Please read and follow all provided instructions.  Your diagnoses today include:  1. Fall, initial encounter   2. Laceration of forehead, initial encounter    Tests performed today include: CT Scan of the affected area that did not show any foreign bodies or broken bones Vital signs. See below for your results today.   Medications prescribed:   Take any prescribed medications only as directed.   Home care instructions:  Follow any educational materials and wound care instructions contained in this packet.   You may shower and wash the area with soap and water, just be sure to pat the area dry and not rub over the stitches. Do no put your stiches underwater (in a bath, pool, or lake). Getting stiches wet can slow down healing and increase your chances of getting an infection. You may apply Bacitracin or Neosporin twice a day for 7 days, and keep the ara clean with  bandage or gauze. Do not apply alcohol or hydrogen peroxide. Cover the area if it draining or weeping.   Follow-up instructions: Suture Removal: Return to the Emergency Department or see your primary care care doctor in 5-7 days for a recheck of your wound and removal of your sutures or staples.    Return instructions:  Return to the Emergency Department if you have: Fever Worsening pain Worsening swelling of the wound Pus draining from the wound Redness of the skin that moves away from the wound, especially if it streaks away from the affected area  Any other emergent concerns  Your vital signs today were: BP 172/87    Pulse 72    Temp 99 F (37.2 C)    Resp 16    Wt 61.2 kg    SpO2 100%    BMI 22.47 kg/m  If your blood pressure (BP) was elevated above 135/85 this visit, please have this repeated by your doctor within one month. --------------

## 2016-06-15 NOTE — ED Notes (Signed)
Bed: [facility] Expected date:  Expected time:  Means of arrival:  Comments: EMS- 82yo M, fall/head lac

## 2016-06-18 ENCOUNTER — Telehealth: Payer: Self-pay

## 2016-06-18 ENCOUNTER — Ambulatory Visit: Payer: Self-pay | Admitting: Nurse Practitioner

## 2016-06-18 NOTE — Telephone Encounter (Signed)
Message left triage voicemail: Patient called c/o leg swelling and requested appointment to see ManX or Dr.Green.  I called patient, patient with right leg swelling and would like to be seen. Patient states he is willing to come to [facility] vs waiting for next available at [facility] 07/02/16. Patient scheduled to see Janett Billow today at 1:00 pm (patient was unable to come at 11:30 am)

## 2016-06-18 NOTE — Telephone Encounter (Signed)
Patient called back and stated he is having car repairs and needs to reschedule appointment to Monday.  Patient will now see Dr.Reed on Monday at 3:30 pm. Patient aware if symptoms progress, redness, pain, or fever to seek medical attention at Urgent Care or the Emergency Room. Patient verbalized understanding and states he will be ok until Monday.

## 2016-06-22 ENCOUNTER — Ambulatory Visit: Payer: Medicare Other | Admitting: Internal Medicine

## 2016-06-22 ENCOUNTER — Telehealth: Payer: Self-pay | Admitting: *Deleted

## 2016-06-22 NOTE — Telephone Encounter (Signed)
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 I called him back to reschedule and left message for him to call the office back to reschedule his appointment.

## 2016-06-24 ENCOUNTER — Non-Acute Institutional Stay: Payer: Medicare Other | Admitting: Nurse Practitioner

## 2016-06-24 ENCOUNTER — Encounter: Payer: Self-pay | Admitting: Nurse Practitioner

## 2016-06-24 DIAGNOSIS — N400 Enlarged prostate without lower urinary tract symptoms: Secondary | ICD-10-CM

## 2016-06-24 DIAGNOSIS — R269 Unspecified abnormalities of gait and mobility: Secondary | ICD-10-CM | POA: Diagnosis not present

## 2016-06-24 DIAGNOSIS — E039 Hypothyroidism, unspecified: Secondary | ICD-10-CM | POA: Diagnosis not present

## 2016-06-24 DIAGNOSIS — I10 Essential (primary) hypertension: Secondary | ICD-10-CM

## 2016-06-24 DIAGNOSIS — R609 Edema, unspecified: Secondary | ICD-10-CM | POA: Diagnosis not present

## 2016-06-24 DIAGNOSIS — L03116 Cellulitis of left lower limb: Secondary | ICD-10-CM

## 2016-06-24 DIAGNOSIS — D649 Anemia, unspecified: Secondary | ICD-10-CM | POA: Diagnosis not present

## 2016-06-24 NOTE — Assessment & Plan Note (Signed)
Doxycycline 100mg  bid x 10 days. Mycolog II cream bid to LLE. Bactroban daily to the left knee abrasion until healed.

## 2016-06-24 NOTE — Assessment & Plan Note (Signed)
No urinary retention, continue Tamsulosin and Finasteride. 1-2x/night bathroom trips. 10/23/14 Urology: asymptomatic f/u 1 year

## 2016-06-24 NOTE — Assessment & Plan Note (Signed)
Hgb 9s 02/2015. 10/15/15 Hgb 11.3

## 2016-06-24 NOTE — Assessment & Plan Note (Signed)
Trace edema BLE, no SOB or cough, continue Furosemide 10mg  daily, Na 139, K 4.4, Bun 24, creat 1.08 10/15/15

## 2016-06-24 NOTE — Assessment & Plan Note (Signed)
Controlled, continue Losartan 25mg ,

## 2016-06-24 NOTE — Assessment & Plan Note (Signed)
Ambulates with walker, prn PT, risk falling, last fall was 06/15/16, mechanical in nature, sustained left forehead contusion and hematoma, sutures x3 removed today.

## 2016-06-24 NOTE — Progress Notes (Signed)
Location:  [facility] (12) Provider:  Aarik Blank, Manxie  NP  Jeanmarie Hubert, MD  Patient Care Team: Estill Dooms, MD as PCP - General (Internal Medicine) [place] Otho Darner, NP as Nurse Practitioner (Nurse Practitioner) Earlie Server, MD as Consulting Physician (Orthopedic Surgery) Jovita Gamma, MD as Consulting Physician (Neurosurgery) Rutherford Guys, MD as Consulting Physician (Ophthalmology) Jettie Booze, MD as Consulting Physician (Cardiology) Marchia Bond, MD as Consulting Physician (Orthopedic Surgery) Hurman Horn, MD as Consulting Physician (Ophthalmology) Verner Chol, MD as Consulting Physician (Sports Medicine)  Extended Emergency Contact Information Primary Emergency Contact: Qin,Priscilla Address: [address]          [place], [place] [address] [place] Phone: [phone] Relation: Spouse  Code Status:  DNR Goals of care: Advanced Directive information Advanced Directives 06/24/2016  Does patient have an advance directive? Yes  Type of Advance Directive Living will;Healthcare Power of Attorney  Does patient want to make changes to advanced directive? No - Patient declined  Copy of advanced directive(s) in chart? Yes  Would patient like information on creating an advanced directive? -  Pre-existing out of facility DNR order (yellow form or pink MOST form) -     Chief Complaint  Patient presents with  . Acute Visit    suture removal, cellulitis,    HPI:  Pt is a 82 y.o. male seen today for an acute visit for f/u ED evaluation for the right head contusion sustained from a mechanical fall 06/15/16, CT head w/o CM unremarkable. Left knee abrasion, LLE swelling, fever, redness.    Hx of BPH, no urinary retention while on Finasteride and Tamsulosin. Chronic BLE edema, denied cough or paroxysmal nocturnal orthopnea, takes Furosemide 20mg  daily, Bun/creat 24/1.08 10/15/15,  hypothyroidism, takes Levothyroxine 181mcg, last TSH 1.47 10/15/15. His blood pressure is controlled on Losartan 50mg . 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    Past Medical History:  Diagnosis Date  . Acute posthemorrhagic anemia 12/23/2011  . Anemia 03/06/2015  . BPH (benign prostatic hyperplasia)   . Cardiomegaly 12/24/2011  . Degeneration of thoracic or thoracolumbar intervertebral disc 12/24/2011  . Edema 04/28/2012  . Fracture, intertrochanteric, left femur ([place]) 12/17/2011  . Hypertension   . Other abnormal blood chemistry 12/24/2011  . Senile osteoporosis 08/25/2012  . Spinal stenosis, lumbar region, without neurogenic claudication 12/24/2011  . Thyroid disease   . Unspecified hearing loss 12/24/2011  . Unspecified hypothyroidism 12/23/2011   Past Surgical History:  Procedure Laterality Date  . COMPRESSION HIP SCREW  12/17/2011   Procedure: COMPRESSION HIP;  Surgeon: Johnny Bridge, MD;  Location: [facility];  Service: Orthopedics;  Laterality: Left;  . FOOT SURGERY      Allergies  Allergen Reactions  . Clindamycin/Lincomycin     Developed C. dificile  . Horse-Derived Products     Horse serum  . Mold Extract [Trichophyton]       Medication List       Accurate as of 06/24/16  2:51 PM. Always use your most recent med list.          aspirin 81 MG tablet Take 81 mg by mouth daily.   feeding supplement (ENSURE ENLIVE) Liqd Take 237 mLs by mouth 2 (two) times daily between meals.   finasteride 5 MG tablet Commonly known as:  PROSCAR Take 5 mg by mouth daily.  furosemide 20 MG tablet Commonly known as:  LASIX Take 10 mg by mouth daily. Reported on 08/01/2015   levothyroxine 137 MCG tablet Commonly known as:  SYNTHROID, LEVOTHROID Take one tablet by mouth 30 minutes before breakfast for thyroid.   losartan 50 MG tablet Commonly known as:  COZAAR Take 1/2 tablet by mouth once daily to strengthen heart and to control blood  pressure   multivitamin tablet Take 1 tablet by mouth daily.   Resveratrol 250 MG Caps Take 250 mg by mouth daily.   tamsulosin 0.4 MG Caps capsule Commonly known as:  FLOMAX Take 0.4 mg by mouth daily.   VITAMIN B COMPLEX PO Take 1 tablet by mouth daily.   vitamin C 500 MG tablet Commonly known as:  ASCORBIC ACID Take 500 mg by mouth daily.   Vitamin D 2000 units tablet Take 2,000 Units by mouth daily.   vitamin E 400 UNIT capsule Generic drug:  vitamin E Take 400 Units by mouth daily.       Review of Systems  Constitutional: Negative for chills, diaphoresis and fever.  HENT: Positive for hearing loss. Negative for congestion, ear discharge, ear pain, nosebleeds and tinnitus.   Eyes: Negative for photophobia, pain, discharge and redness.  Respiratory: Negative for cough, shortness of breath, wheezing and stridor.        Hoarse  Cardiovascular: Positive for leg swelling. Negative for chest pain.       1+ edema BLE  Gastrointestinal: Positive for diarrhea. Negative for abdominal pain, blood in stool, constipation, nausea and vomiting.       FOBT negative. External hemorrhoids.   Endocrine: Negative for polydipsia.  Genitourinary: Positive for frequency. Negative for dysuria, flank pain, hematuria and urgency.  Musculoskeletal: Positive for back pain. Negative for myalgias and neck pain.       R sciatica nerve pain.  History of OP. Treated with Prolia. Ambulates with walker, not straight, last fall a few weeks ago, lost balance, left upper arm ecchymosis is resolving.   Skin: Negative for rash.       Spontaneous ecchymosis. Left forehead hematoma, sutures x3 removed, ecchymosis left face and neck. LLE erythema, swelling, warmth, tenderness. Left knee abrasion.   Allergic/Immunologic: Negative for environmental allergies.  Neurological: Negative for dizziness, tremors, seizures, weakness and headaches.  Hematological: Bruises/bleeds easily.       Limbs   Psychiatric/Behavioral: Negative for hallucinations and suicidal ideas. The patient is not nervous/anxious.     Immunization History  Administered Date(s) Administered  . Influenza Whole 05/17/2012  . Influenza-Unspecified 05/17/2013, 05/17/2014, 05/10/2015  . Pneumococcal Polysaccharide-23 05/18/2011   Pertinent  Health Maintenance Due  Topic Date Due  . PNA vac Low Risk Adult (2 of 2 - PCV13) 05/17/2012  . INFLUENZA VACCINE  03/17/2016  . DEXA SCAN  05/15/2018   Fall Risk  10/24/2015 06/11/2015 03/06/2015 02/20/2015 10/26/2013  Falls in the past year? Yes No Yes Yes No  Number falls in past yr: 1 - 1 1 -  Injury with Fall? No - No Yes -   Functional Status Survey:    Vitals:   06/24/16 1429  BP: 132/66  Pulse: 67  Resp: 16  Temp: 98.5 F (36.9 C)  Weight: 138 lb (62.6 kg)  Height: 5\' 5"  (1.651 m)   Body mass index is 22.96 kg/m. Physical Exam  Constitutional: He is oriented to person, place, and time. He appears well-developed and well-nourished. No distress.  HENT:  Head: Normocephalic and atraumatic.  Right Ear: External  ear normal. A middle ear effusion is present.  Left Ear: External ear normal.  No middle ear effusion.  Nose: Nose normal.  Mouth/Throat: Oropharynx is clear and moist. No oropharyngeal exudate.  Eyes: Conjunctivae and EOM are normal. Pupils are equal, round, and reactive to light. Right eye exhibits no discharge. Left eye exhibits no discharge. No scleral icterus.  Neck: Normal range of motion. Neck supple. No JVD present. No tracheal deviation present. No thyromegaly present.  Cardiovascular: Normal rate, regular rhythm, normal heart sounds and intact distal pulses.   No murmur heard. Pulmonary/Chest: Effort normal and breath sounds normal. No stridor. No respiratory distress. He has no wheezes. He has no rales. He exhibits no tenderness.  Hoarse  Abdominal: Soft. Bowel sounds are normal. He exhibits no distension. There is no tenderness. There is  no rebound and no guarding.  External hemorrhoids.   Genitourinary: Rectal exam shows guaiac negative stool.  Musculoskeletal: Normal range of motion. He exhibits edema. He exhibits no tenderness.  1+ edema BLE  Lymphadenopathy:    He has no cervical adenopathy.  Neurological: He is alert and oriented to person, place, and time. He has normal reflexes. No cranial nerve deficit. He exhibits normal muscle tone. Coordination normal.  Skin: Skin is warm and dry. No rash noted. He is not diaphoretic. No erythema. No pallor.  Spontaneous ecchymosis. Left forehead hematoma, sutures x3 removed, ecchymosis left face and neck. LLE erythema, swelling, warmth, tenderness. Left knee abrasion.    Psychiatric: He has a normal mood and affect. His behavior is normal. Judgment and thought content normal.  Stated his appetite has been poor since his wife passed away    Labs reviewed:  Recent Labs  10/15/15  NA 139  K 4.4  BUN 24*  CREATININE 1.1    Recent Labs  10/15/15  AST 20  ALT 12  ALKPHOS 38    Recent Labs  10/15/15  WBC 6.3  HGB 11.3*  HCT 34*  PLT 226   Lab Results  Component Value Date   TSH 1.47 10/15/2015   No results found for: HGBA1C No results found for: CHOL, HDL, LDLCALC, LDLDIRECT, TRIG, CHOLHDL  Significant Diagnostic Results in last 30 days:  Ct Head Wo Contrast  Result Date: 06/15/2016 CLINICAL DATA:  Fall striking head. Laceration along the left forehead. EXAM: CT HEAD WITHOUT CONTRAST TECHNIQUE: Contiguous axial images were obtained from the base of the skull through the vertex without intravenous contrast. COMPARISON:  None. FINDINGS: Brain: Symmetric calcifications in the cerebellum and lentiform nuclei it also with some linear calcifications in the periventricular white matter and corona radiata, most typically physiologic although those could seen in the setting of Fahr disease, os a processes such as carbon monoxide poisoning ; parathyroid disorder; and in  some other conditions. Periventricular white matter and corona radiata hypodensities favor chronic ischemic microvascular white matter disease. No intracranial hemorrhage, mass lesion, or acute CVA. Vascular: There is atherosclerotic calcification of the cavernous carotid arteries bilaterally. Skull: Unremarkable Sinuses/Orbits: Unremarkable Other: Left upper periorbital hematoma extending into the left forehead/scalp. IMPRESSION: 1. Left frontal forehead and upper periorbital scalp and facial hematoma. No intraorbital abnormality or underlying bony abnormality. 2. No acute intracranial findings. 3. Calcifications along the dentate nuclei of the cerebellum, the lentiform nucleus of the basal ganglia, and faint linear calcifications in the periventricular white matter and corona radiata. Although most likely benign physiologic calcifications, other entities the could be associated with calcifications in a similar pattern include Fahr disease, toxic  processes such as carbon monoxide poisoning; parathyroid disorders ; and certain other inherited and infectious conditions. Electronically Signed   By: Van Clines M.D.   On: 06/15/2016 20:22    Assessment/Plan Cellulitis of leg, left Doxycycline 100mg  bid x 10 days. Mycolog II cream bid to LLE. Bactroban daily to the left knee abrasion until healed.   HTN (hypertension) Controlled, continue Losartan 25mg ,    Hypothyroidism 10/19/14 TSH 2.23, 10/15/15 TSH 1.47, continue Levothyroxine 172mcg daily    BPH (benign prostatic hyperplasia) No urinary retention, continue Tamsulosin and Finasteride. 1-2x/night bathroom trips. 10/23/14 Urology: asymptomatic f/u 1 year   Edema Trace edema BLE, no SOB or cough, continue Furosemide 10mg  daily, Na 139, K 4.4, Bun 24, creat 1.08 10/15/15    Abnormality of gait Ambulates with walker, prn PT, risk falling, last fall was 06/15/16, mechanical in nature, sustained left forehead contusion and hematoma, sutures x3  removed today.   Anemia Hgb 9s 02/2015. 10/15/15 Hgb 11.3     Family/ staff Communication: continue IL  Labs/tests ordered:  none

## 2016-06-24 NOTE — Assessment & Plan Note (Signed)
10/19/14 TSH 2.23, 10/15/15 TSH 1.47, continue Levothyroxine 13mcg daily

## 2016-07-17 ENCOUNTER — Other Ambulatory Visit: Payer: Self-pay | Admitting: Nurse Practitioner

## 2016-08-24 ENCOUNTER — Other Ambulatory Visit: Payer: Self-pay | Admitting: *Deleted

## 2016-08-24 MED ORDER — LEVOTHYROXINE SODIUM 137 MCG PO TABS: ORAL_TABLET | ORAL | 3 refills | Status: DC

## 2016-08-24 NOTE — Telephone Encounter (Signed)
Patient requested Refill to be faxed to [facility]

## 2016-09-14 ENCOUNTER — Encounter: Payer: Self-pay | Admitting: Nurse Practitioner

## 2016-09-14 ENCOUNTER — Ambulatory Visit (INDEPENDENT_AMBULATORY_CARE_PROVIDER_SITE_OTHER): Payer: Medicare Other | Admitting: Nurse Practitioner

## 2016-09-14 VITALS — BP 124/68 | HR 62 | Temp 97.7°F | Resp 18 | Ht 65.0 in | Wt 141.6 lb

## 2016-09-14 DIAGNOSIS — I872 Venous insufficiency (chronic) (peripheral): Secondary | ICD-10-CM | POA: Diagnosis not present

## 2016-09-14 DIAGNOSIS — R609 Edema, unspecified: Secondary | ICD-10-CM | POA: Diagnosis not present

## 2016-09-14 DIAGNOSIS — R600 Localized edema: Principal | ICD-10-CM

## 2016-09-14 NOTE — Progress Notes (Signed)
Careteam: Patient Care Team: Estill Dooms, MD as PCP - General (Internal Medicine) [place] Otho Darner, NP as Nurse Practitioner (Nurse Practitioner) Earlie Server, MD as Consulting Physician (Orthopedic Surgery) Jovita Gamma, MD as Consulting Physician (Neurosurgery) Rutherford Guys, MD as Consulting Physician (Ophthalmology) Jettie Booze, MD as Consulting Physician (Cardiology) Marchia Bond, MD as Consulting Physician (Orthopedic Surgery) Hurman Horn, MD as Consulting Physician (Ophthalmology) Verner Chol, MD as Consulting Physician (Sports Medicine)  Advanced Directive information Does Patient Have a Medical Advance Directive?: Yes, Type of Advance Directive: Healthcare Power of [place];Living will  Allergies  Allergen Reactions  . Clindamycin/Lincomycin     Developed C. dificile  . Horse-Derived Products     Horse serum  . Mold Extract [Trichophyton]     Chief Complaint  Patient presents with  . Acute Visit    Swelling in both ankles x 6 months. Currently on 10mg  lasix but pt feels it is not working,.      HPI: Patient is a 83 y.o. male seen in the office today due to edema around his ankle. When asked what bothers you the most about this he says "does not have girlish figure anymore".  No pain. No drainage. Been going on for months.  Has been tired of this and that's why he made the appt.  Uses salt a lot to make food Sits with legs down all day Swelling worse in the evening then in the morning No redness.  Review of Systems:  Review of Systems  Constitutional: Negative for chills, diaphoresis and fever.  HENT: Positive for hearing loss. Negative for congestion, ear discharge, ear pain, nosebleeds and tinnitus.   Eyes: Negative for photophobia, pain, discharge and redness.  Respiratory: Negative for cough, shortness of breath, wheezing and stridor.   Cardiovascular: Positive for leg swelling. Negative for chest pain.       1+  edema BLE  Endocrine: Negative for polydipsia.  Allergic/Immunologic: Negative for environmental allergies.  Neurological: Negative for dizziness, tremors, seizures, weakness and headaches.  Hematological: Bruises/bleeds easily.    Past Medical History:  Diagnosis Date  . Acute posthemorrhagic anemia 12/23/2011  . Anemia 03/06/2015  . BPH (benign prostatic hyperplasia)   . Cardiomegaly 12/24/2011  . Degeneration of thoracic or thoracolumbar intervertebral disc 12/24/2011  . Edema 04/28/2012  . Fracture, intertrochanteric, left femur ([place]) 12/17/2011  . Hypertension   . Other abnormal blood chemistry 12/24/2011  . Senile osteoporosis 08/25/2012  . Spinal stenosis, lumbar region, without neurogenic claudication 12/24/2011  . Thyroid disease   . Unspecified hearing loss 12/24/2011  . Unspecified hypothyroidism 12/23/2011   Past Surgical History:  Procedure Laterality Date  . COMPRESSION HIP SCREW  12/17/2011   Procedure: COMPRESSION HIP;  Surgeon: Johnny Bridge, MD;  Location: [facility];  Service: Orthopedics;  Laterality: Left;  . FOOT SURGERY     Social History:   reports that he quit smoking about 31 years ago. His smoking use included Pipe. He has never used smokeless tobacco. He reports that he drinks about 4.2 oz of alcohol per week . He reports that he does not use drugs.  Family History  Problem Relation Age of Onset  . Cancer Mother     Medications: Patient's Medications  New Prescriptions   No medications on file  Previous Medications   ASPIRIN 81 MG TABLET    Take 81 mg by mouth daily.   B COMPLEX VITAMINS (VITAMIN B COMPLEX PO)    Take  1 tablet by mouth daily.    CHOLECALCIFEROL (VITAMIN D) 2000 UNITS TABLET    Take 2,000 Units by mouth daily.   FEEDING SUPPLEMENT, ENSURE ENLIVE, (ENSURE ENLIVE) LIQD    Take 237 mLs by mouth 2 (two) times daily between meals.   FINASTERIDE (PROSCAR) 5 MG TABLET    Take 5 mg by mouth daily.   FUROSEMIDE (LASIX) 20 MG TABLET    TAKE 1/2 TABLET DAILY     LEVOTHYROXINE (SYNTHROID, LEVOTHROID) 137 MCG TABLET    Take one tablet by mouth 30 minutes before breakfast for thyroid.   LOSARTAN (COZAAR) 50 MG TABLET    Take 1/2 tablet by mouth once daily to strengthen heart and to control blood pressure   MULTIPLE VITAMIN (MULTIVITAMIN) TABLET    Take 1 tablet by mouth daily.   MUPIROCIN OINTMENT (BACTROBAN) 2 %    APPLY TO LEFT KNEE WOUND DAILY   NYSTATIN-TRIAMCINOLONE (MYCOLOG II) CREAM    APPLY TO LOWER LEFT EXTREMITY TWICE A DAY   RESVERATROL 250 MG CAPS    Take 250 mg by mouth daily.   TAMSULOSIN HCL (FLOMAX) 0.4 MG CAPS    Take 0.4 mg by mouth daily.   VITAMIN C (ASCORBIC ACID) 500 MG TABLET    Take 500 mg by mouth daily.   VITAMIN E (VITAMIN E) 400 UNIT CAPSULE    Take 400 Units by mouth daily.  Modified Medications   No medications on file  Discontinued Medications   No medications on file     Physical Exam:  Vitals:   09/14/16 1039  BP: 124/68  Pulse: 62  Resp: 18  Temp: 97.7 F (36.5 C)  TempSrc: Oral  SpO2: 96%  Weight: 141 lb 9.6 oz (64.2 kg)  Height: 5\' 5"  (1.651 m)   Body mass index is 23.56 kg/m.  Physical Exam  Constitutional: He is oriented to person, place, and time. He appears well-developed and well-nourished. No distress.  HENT:  Head: Normocephalic and atraumatic.  Mouth/Throat: Oropharynx is clear and moist. No oropharyngeal exudate.  Eyes: Conjunctivae and EOM are normal. Pupils are equal, round, and reactive to light.  Neck: Normal range of motion. Neck supple.  Cardiovascular: Normal rate, regular rhythm and normal heart sounds.   Pulmonary/Chest: Effort normal and breath sounds normal.  Abdominal: Soft. Bowel sounds are normal.  Musculoskeletal: He exhibits edema (1+ bilaterally). He exhibits no tenderness.  Neurological: He is alert and oriented to person, place, and time.  Skin: Skin is warm and dry. He is not diaphoretic.  Psychiatric: He has a normal mood and affect.    Labs reviewed: Basic  Metabolic Panel:  Recent Labs  10/15/15  NA 139  K 4.4  BUN 24*  CREATININE 1.1  TSH 1.47   Liver Function Tests:  Recent Labs  10/15/15  AST 20  ALT 12  ALKPHOS 38   No results for input(s): LIPASE, AMYLASE in the last 8760 hours. No results for input(s): AMMONIA in the last 8760 hours. CBC:  Recent Labs  10/15/15  WBC 6.3  HGB 11.3*  HCT 34*  PLT 226   Lipid Panel: No results for input(s): CHOL, HDL, LDLCALC, TRIG, CHOLHDL, LDLDIRECT in the last 8760 hours. TSH:  Recent Labs  10/15/15  TSH 1.47   A1C: No results found for: HGBA1C   Assessment/Plan 1. 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 Harle Battiest  [facility] [phone] 8 am - 5 pm) [phone] (after hours)

## 2016-09-14 NOTE — Patient Instructions (Signed)
To wear compression hose daily- on when you wake up and take off at bedtime No added salt Elevate legs as much as tolerate   Peripheral Edema Peripheral edema is swelling that is caused by a buildup of fluid. Peripheral edema most often affects the lower legs, ankles, and feet. It can also develop in the arms, hands, and face. The area of the body that has peripheral edema will look swollen. It may also feel heavy or warm. Your clothes may start to feel tight. Pressing on the area may make a temporary dent in your skin. You may not be able to move your arm or leg as much as usual. There are many causes of peripheral edema. It can be a complication of other diseases, such as congestive heart failure, kidney disease, or a problem with your blood circulation. It also can be a side effect of certain medicines. It often happens to women during pregnancy. Sometimes, the cause is not known. Treating the underlying condition is often the only treatment for peripheral edema. Follow these instructions at home: Pay attention to any changes in your symptoms. Take these actions to help with your discomfort:  Raise (elevate) your legs while you are sitting or lying down.  Move around often to prevent stiffness and to lessen swelling. Do not sit or stand for long periods of time.  Wear support stockings as told by your health care provider.  Follow instructions from your health care provider about limiting salt (sodium) in your diet. Sometimes eating less salt can reduce swelling.  Take over-the-counter and prescription medicines only as told by your health care provider. Your health care provider may prescribe medicine to help your body get rid of excess water (diuretic).  Keep all follow-up visits as told by your health care provider. This is important. Contact a health care provider if:  You have a fever.  Your edema starts suddenly or is getting worse, especially if you are pregnant or have a medical  condition.  You have swelling in only one leg.  You have increased swelling and pain in your legs. Get help right away if:  You develop shortness of breath, especially when you are lying down.  You have pain in your chest or abdomen.  You feel weak.  You faint. This information is not intended to replace advice given to you by your health care provider. Make sure you discuss any questions you have with your health care provider. Document Released: 09/10/2004 Document Revised: 01/06/2016 Document Reviewed: 02/13/2015 Elsevier Interactive Patient Education  2017 [employer].

## 2016-09-21 ENCOUNTER — Other Ambulatory Visit: Payer: Self-pay | Admitting: *Deleted

## 2016-09-21 DIAGNOSIS — R269 Unspecified abnormalities of gait and mobility: Secondary | ICD-10-CM

## 2016-09-21 DIAGNOSIS — E039 Hypothyroidism, unspecified: Secondary | ICD-10-CM

## 2016-09-21 DIAGNOSIS — D509 Iron deficiency anemia, unspecified: Secondary | ICD-10-CM

## 2016-09-21 DIAGNOSIS — I10 Essential (primary) hypertension: Secondary | ICD-10-CM

## 2016-09-21 DIAGNOSIS — R609 Edema, unspecified: Secondary | ICD-10-CM

## 2016-10-08 DIAGNOSIS — L299 Pruritus, unspecified: Secondary | ICD-10-CM | POA: Diagnosis not present

## 2016-10-08 DIAGNOSIS — I872 Venous insufficiency (chronic) (peripheral): Secondary | ICD-10-CM | POA: Diagnosis not present

## 2016-10-15 ENCOUNTER — Other Ambulatory Visit: Payer: Medicare Other

## 2016-10-19 ENCOUNTER — Other Ambulatory Visit: Payer: Medicare Other

## 2016-10-19 DIAGNOSIS — I10 Essential (primary) hypertension: Secondary | ICD-10-CM | POA: Diagnosis not present

## 2016-10-19 DIAGNOSIS — D509 Iron deficiency anemia, unspecified: Secondary | ICD-10-CM | POA: Diagnosis not present

## 2016-10-19 DIAGNOSIS — E039 Hypothyroidism, unspecified: Secondary | ICD-10-CM | POA: Diagnosis not present

## 2016-10-20 ENCOUNTER — Other Ambulatory Visit: Payer: Medicare Other

## 2016-10-20 LAB — CBC
HCT: 33.4 % — ABNORMAL LOW (ref 38.5–50.0)
Hemoglobin: 11.2 g/dL — ABNORMAL LOW (ref 13.2–17.1)
MCH: 33.9 pg — ABNORMAL HIGH (ref 27.0–33.0)
MCHC: 33.5 g/dL (ref 32.0–36.0)
MCV: 101.2 fL — ABNORMAL HIGH (ref 80.0–100.0)
MPV: 9.7 fL (ref 7.5–12.5)
Platelets: 246 10*3/uL (ref 140–400)
RBC: 3.3 MIL/uL — ABNORMAL LOW (ref 4.20–5.80)
RDW: 13.7 % (ref 11.0–15.0)
WBC: 8.1 10*3/uL (ref 3.8–10.8)

## 2016-10-20 LAB — COMPREHENSIVE METABOLIC PANEL
ALT: 9 U/L (ref 9–46)
AST: 20 U/L (ref 10–35)
Albumin: 3.7 g/dL (ref 3.6–5.1)
Alkaline Phosphatase: 71 U/L (ref 40–115)
BUN: 26 mg/dL — ABNORMAL HIGH (ref 7–25)
CO2: 25 mmol/L (ref 20–31)
Calcium: 9.4 mg/dL (ref 8.6–10.3)
Chloride: 105 mmol/L (ref 98–110)
Creat: 1.01 mg/dL (ref 0.70–1.11)
Glucose, Bld: 96 mg/dL (ref 65–99)
Potassium: 4.3 mmol/L (ref 3.5–5.3)
Sodium: 136 mmol/L (ref 135–146)
Total Bilirubin: 0.5 mg/dL (ref 0.2–1.2)
Total Protein: 6 g/dL — ABNORMAL LOW (ref 6.1–8.1)

## 2016-10-20 LAB — TSH: TSH: 0.16 mIU/L — ABNORMAL LOW (ref 0.40–4.50)

## 2016-10-22 ENCOUNTER — Non-Acute Institutional Stay: Payer: Medicare Other | Admitting: Nurse Practitioner

## 2016-10-22 ENCOUNTER — Encounter: Payer: Self-pay | Admitting: Nurse Practitioner

## 2016-10-22 DIAGNOSIS — E039 Hypothyroidism, unspecified: Secondary | ICD-10-CM | POA: Diagnosis not present

## 2016-10-22 DIAGNOSIS — D649 Anemia, unspecified: Secondary | ICD-10-CM | POA: Diagnosis not present

## 2016-10-22 DIAGNOSIS — I10 Essential (primary) hypertension: Secondary | ICD-10-CM

## 2016-10-22 DIAGNOSIS — N400 Enlarged prostate without lower urinary tract symptoms: Secondary | ICD-10-CM

## 2016-10-22 DIAGNOSIS — R609 Edema, unspecified: Secondary | ICD-10-CM

## 2016-10-22 DIAGNOSIS — E876 Hypokalemia: Secondary | ICD-10-CM | POA: Diagnosis not present

## 2016-10-22 MED ORDER — LEVOTHYROXINE SODIUM 125 MCG PO TABS: 125.0000 ug | ORAL_TABLET | Freq: Every day | ORAL | 3 refills | Status: DC

## 2016-10-22 NOTE — Assessment & Plan Note (Signed)
No urinary retention, continue Tamsulosin and Finasteride. 1-2x/night bathroom trips. 10/23/14 Urology: asymptomatic

## 2016-10-22 NOTE — Assessment & Plan Note (Signed)
10/19/16 Hgb 11.2

## 2016-10-22 NOTE — Assessment & Plan Note (Signed)
10/19/16 K 4.3

## 2016-10-22 NOTE — Assessment & Plan Note (Signed)
10/19/16 TSH 0.16 10/22/16 decrease Levothyroxine 160mcg, update TSH 12 weeks.

## 2016-10-22 NOTE — Assessment & Plan Note (Signed)
Controlled, continue Losartan 25mg , 10/19/16 Na 136, K 4.3, Bun 26, creat 1.01.

## 2016-10-22 NOTE — Assessment & Plan Note (Signed)
Trace edema BLE, R>L, no SOB or cough, continue Furosemide 10mg  daily,

## 2016-10-22 NOTE — Progress Notes (Signed)
Location:  [facility] (12) Provider:  Micha Erck, Manxie  NP  Jeanmarie Hubert, MD  Patient Care Team: Estill Dooms, MD as PCP - General (Internal Medicine) Wilber Tamika Nou Otho Darner, NP as Nurse Practitioner (Nurse Practitioner) Earlie Server, MD as Consulting Physician (Orthopedic Surgery) Jovita Gamma, MD as Consulting Physician (Neurosurgery) Rutherford Guys, MD as Consulting Physician (Ophthalmology) Jettie Booze, MD as Consulting Physician (Cardiology) Marchia Bond, MD as Consulting Physician (Orthopedic Surgery) Hurman Horn, MD as Consulting Physician (Ophthalmology) Verner Chol, MD as Consulting Physician (Sports Medicine)  Extended Emergency Contact Information Primary Emergency Contact: Incorvaia,Priscilla Address: [address]          [place], [place] [address] [place] Phone: [phone] Relation: Spouse  Code Status:  DNR Goals of care: Advanced Directive information Advanced Directives 10/22/2016  Does Patient Have a Medical Advance Directive? Yes  Type of Paramedic of [place];Living will  Does patient want to make changes to medical advance directive? No - Patient declined  Copy of [place] in Chart? Yes  Would patient like information on creating a medical advance directive? -  Pre-existing out of facility DNR order (yellow form or pink MOST form) -     Chief Complaint  Patient presents with  . Medical Management of Chronic Issues    HPI:  Pt is a 83 y.o. male seen today for evaluation of chronic medical conditions.   Hx of BPH, no urinary retention while on Finasteride and Tamsulosin. Chronic BLE edema, denied cough or paroxysmal nocturnal orthopnea, takes Furosemide 10mg  daily, Bun/creat 26/1/1.01 10/19/16,  hypothyroidism, takes Levothyroxine 132mcg, last TSH 0.16 10/19/16. His blood pressure is controlled on Losartan 50mg . 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 Acute posthemorrhagic anemia 12/23/2011  . Anemia 03/06/2015  . BPH (benign prostatic hyperplasia)   . Cardiomegaly 12/24/2011  . Degeneration of thoracic or thoracolumbar intervertebral disc 12/24/2011  . Edema 04/28/2012  . Fracture, intertrochanteric, left femur ([place]) 12/17/2011  . Hypertension   . Other abnormal blood chemistry 12/24/2011  . Senile osteoporosis 08/25/2012  . Spinal stenosis, lumbar region, without neurogenic claudication 12/24/2011  . Thyroid disease   . Unspecified hearing loss 12/24/2011  . Unspecified hypothyroidism 12/23/2011   Past Surgical History:  Procedure Laterality Date  . COMPRESSION HIP SCREW  12/17/2011   Procedure: COMPRESSION HIP;  Surgeon: Johnny Bridge, MD;  Location: [facility];  Service: Orthopedics;  Laterality: Left;  . FOOT SURGERY      Allergies  Allergen Reactions  . Clindamycin/Lincomycin     Developed C. dificile  . Horse-Derived Products     Horse serum  . Mold Extract [Trichophyton]     Allergies as of 10/22/2016      Reactions   Clindamycin/lincomycin    Developed C. dificile   Horse-derived Products    Horse serum   Mold Extract [trichophyton]       Medication List       Accurate as of 10/22/16  2:05 PM. Always use your most recent med list.          aspirin 81 MG tablet Take 81 mg by mouth daily.   Clobetasol Prop Emollient Base 0.05 % emollient cream Apply topically 2 (two) times daily.   feeding supplement (ENSURE ENLIVE) Liqd Take 237 mLs  by mouth 2 (two) times daily between meals.   finasteride 5 MG tablet Commonly known as:  PROSCAR Take 5 mg by mouth daily.   furosemide 20 MG tablet Commonly known as:  LASIX TAKE 1/2 TABLET DAILY   levothyroxine 137 MCG tablet Commonly known as:  SYNTHROID, LEVOTHROID Take one tablet by mouth 30 minutes before breakfast for thyroid.   losartan 50 MG  tablet Commonly known as:  COZAAR Take 1/2 tablet by mouth once daily to strengthen heart and to control blood pressure   multivitamin tablet Take 1 tablet by mouth daily.   mupirocin ointment 2 % Commonly known as:  BACTROBAN APPLY TO LEFT KNEE WOUND DAILY   nystatin-triamcinolone cream Commonly known as:  MYCOLOG II APPLY TO LOWER LEFT EXTREMITY TWICE A DAY   Resveratrol 250 MG Caps Take 250 mg by mouth daily.   tamsulosin 0.4 MG Caps capsule Commonly known as:  FLOMAX Take 0.4 mg by mouth daily.   VITAMIN B COMPLEX PO Take 1 tablet by mouth daily.   vitamin C 500 MG tablet Commonly known as:  ASCORBIC ACID Take 500 mg by mouth daily.   Vitamin D 2000 units tablet Take 2,000 Units by mouth daily.   vitamin E 400 UNIT capsule Generic drug:  vitamin E Take 400 Units by mouth daily.       Review of Systems  Constitutional: Negative for chills, diaphoresis and fever.  HENT: Positive for hearing loss. Negative for congestion, ear discharge, ear pain, nosebleeds and tinnitus.   Eyes: Negative for photophobia, pain, discharge and redness.  Respiratory: Negative for cough, shortness of breath, wheezing and stridor.        Hoarse  Cardiovascular: Positive for leg swelling. Negative for chest pain.       1+ edema BLE  Gastrointestinal: Positive for diarrhea. Negative for abdominal pain, blood in stool, constipation, nausea and vomiting.       FOBT negative. External hemorrhoids.   Endocrine: Negative for polydipsia.  Genitourinary: Positive for frequency. Negative for dysuria, flank pain, hematuria and urgency.  Musculoskeletal: Positive for back pain. Negative for myalgias and neck pain.       R sciatica nerve pain.  History of OP. Treated with Prolia. Ambulates with walker, not straight, last fall a few weeks ago, lost balance, left upper arm ecchymosis is resolving.   Skin: Negative for rash.       Spontaneous ecchymosis. Left forehead hematoma, sutures x3 removed,  ecchymosis left face and neck. LLE erythema, swelling, warmth, tenderness. Left knee abrasion.   Allergic/Immunologic: Negative for environmental allergies.  Neurological: Negative for dizziness, tremors, seizures, weakness and headaches.  Hematological: Bruises/bleeds easily.       Limbs  Psychiatric/Behavioral: Negative for hallucinations and suicidal ideas. The patient is not nervous/anxious.     Immunization History  Administered Date(s) Administered  . Influenza Whole 05/17/2012  . Influenza, High Dose Seasonal PF 05/18/2016  . Influenza-Unspecified 05/17/2013, 05/17/2014, 05/10/2015  . Pneumococcal Polysaccharide-23 05/18/2011   Pertinent  Health Maintenance Due  Topic Date Due  . PNA vac Low Risk Adult (2 of 2 - PCV13) 05/17/2012  . DEXA SCAN  05/15/2018  . INFLUENZA VACCINE  Completed   Fall Risk  09/14/2016 10/24/2015 06/11/2015 03/06/2015 02/20/2015  Falls in the past year? Yes Yes No Yes Yes  Number falls in past yr: 1 1 - 1 1  Injury with Fall? Yes No - No Yes   Functional Status Survey:    Vitals:   10/22/16 1329  BP: 108/72  Pulse: 74  Resp: 18  Temp: 98.5 F (36.9 C)  SpO2: 98%  Weight: 140 lb (63.5 kg)  Height: 5\' 5"  (1.651 m)   Body mass index is 23.3 kg/m. Physical Exam  Constitutional: He is oriented to person, place, and time. He appears well-developed and well-nourished. No distress.  HENT:  Head: Normocephalic and atraumatic.  Right Ear: External ear normal. A middle ear effusion is present.  Left Ear: External ear normal.  No middle ear effusion.  Nose: Nose normal.  Mouth/Throat: Oropharynx is clear and moist. No oropharyngeal exudate.  Eyes: Conjunctivae and EOM are normal. Pupils are equal, round, and reactive to light. Right eye exhibits no discharge. Left eye exhibits no discharge. No scleral icterus.  Neck: Normal range of motion. Neck supple. No JVD present. No tracheal deviation present. No thyromegaly present.  Cardiovascular: Normal rate,  regular rhythm, normal heart sounds and intact distal pulses.   No murmur heard. Pulmonary/Chest: Effort normal and breath sounds normal. No stridor. No respiratory distress. He has no wheezes. He has no rales. He exhibits no tenderness.  Hoarse  Abdominal: Soft. Bowel sounds are normal. He exhibits no distension. There is no tenderness. There is no rebound and no guarding.  External hemorrhoids.   Genitourinary: Rectal exam shows guaiac negative stool.  Musculoskeletal: Normal range of motion. He exhibits edema. He exhibits no tenderness.  1+ edema BLE  Lymphadenopathy:    He has no cervical adenopathy.  Neurological: He is alert and oriented to person, place, and time. He has normal reflexes. No cranial nerve deficit. He exhibits normal muscle tone. Coordination normal.  Skin: Skin is warm and dry. No rash noted. He is not diaphoretic. No erythema. No pallor.  Spontaneous ecchymosis. Left forehead hematoma, sutures x3 removed, ecchymosis left face and neck. LLE erythema, swelling, warmth, tenderness. Left knee abrasion.    Psychiatric: He has a normal mood and affect. His behavior is normal. Judgment and thought content normal.  Stated his appetite has been poor since his wife passed away    Labs reviewed:  Recent Labs  10/19/16 0001  NA 136  K 4.3  CL 105  CO2 25  GLUCOSE 96  BUN 26*  CREATININE 1.01  CALCIUM 9.4    Recent Labs  10/19/16 0001  AST 20  ALT 9  ALKPHOS 71  BILITOT 0.5  PROT 6.0*  ALBUMIN 3.7    Recent Labs  10/19/16 0001  WBC 8.1  HGB 11.2*  HCT 33.4*  MCV 101.2*  PLT 246   Lab Results  Component Value Date   TSH 0.16 (L) 10/19/2016   No results found for: HGBA1C No results found for: CHOL, HDL, LDLCALC, LDLDIRECT, TRIG, CHOLHDL  Significant Diagnostic Results in last 30 days:  No results found.  Assessment/Plan Hypothyroidism 10/19/16 TSH 0.16 10/22/16 decrease Levothyroxine 114mcg, update TSH 12 weeks.    HTN  (hypertension) Controlled, continue Losartan 25mg , 10/19/16 Na 136, K 4.3, Bun 26, creat 1.01.   BPH (benign prostatic hyperplasia) No urinary retention, continue Tamsulosin and Finasteride. 1-2x/night bathroom trips. 10/23/14 Urology: asymptomatic     Edema Trace edema BLE, R>L, no SOB or cough, continue Furosemide 10mg  daily,  Hypokalemia 10/19/16 K 4.3  Anemia 10/19/16 Hgb 11.2     Family/ staff Communication: continue IL  Labs/tests ordered:  TSH 12 weeks.

## 2016-10-29 DIAGNOSIS — H04123 Dry eye syndrome of bilateral lacrimal glands: Secondary | ICD-10-CM | POA: Diagnosis not present

## 2016-10-29 DIAGNOSIS — H1850 Unspecified hereditary corneal dystrophies: Secondary | ICD-10-CM | POA: Diagnosis not present

## 2016-10-29 DIAGNOSIS — Z961 Presence of intraocular lens: Secondary | ICD-10-CM | POA: Diagnosis not present

## 2016-10-29 DIAGNOSIS — H353131 Nonexudative age-related macular degeneration, bilateral, early dry stage: Secondary | ICD-10-CM | POA: Diagnosis not present

## 2016-11-17 ENCOUNTER — Other Ambulatory Visit: Payer: Self-pay | Admitting: *Deleted

## 2016-11-17 MED ORDER — FUROSEMIDE 20 MG PO TABS: 10.0000 mg | ORAL_TABLET | Freq: Every day | ORAL | 3 refills | Status: DC

## 2016-11-17 MED ORDER — FUROSEMIDE 20 MG PO TABS: 10.0000 mg | ORAL_TABLET | Freq: Every day | ORAL | 0 refills | Status: DC

## 2016-11-17 NOTE — Telephone Encounter (Signed)
Patient called and stated that he ran out of his Furosemide and needed a local supply sent to pharmacy and a Mail order Rx sent. Faxed to pharmacy.

## 2016-12-02 ENCOUNTER — Ambulatory Visit (INDEPENDENT_AMBULATORY_CARE_PROVIDER_SITE_OTHER): Payer: Medicare Other | Admitting: Internal Medicine

## 2016-12-02 ENCOUNTER — Encounter: Payer: Self-pay | Admitting: Internal Medicine

## 2016-12-02 DIAGNOSIS — M25512 Pain in left shoulder: Secondary | ICD-10-CM

## 2016-12-02 MED ORDER — IBUPROFEN 200 MG PO TABS: ORAL_TABLET | ORAL | 0 refills | Status: DC

## 2016-12-02 NOTE — Progress Notes (Signed)
Facility  [facility]    Place of Service:   OFFICE    Allergies  Allergen Reactions  . Clindamycin/Lincomycin     Developed C. dificile  . Horse-Derived Products     Horse serum  . Mold Extract [Trichophyton]     Chief Complaint  Patient presents with  . Arm Pain    left arm for 10 days. No injury. Takes Advil at night to sleep.    HPI:  Acute pain of left shoulder - no injury or exercise or other identified precipitating factors. Denies electric like pains or hot or cold. Has a ache that runs from the left shoulder to the left hand. Aggravants include rotation and elevation at times he is putting dishes away. G\Denies problems with putting on shoes or pants, but it is difficult to put on a T shirt. No neck pains.    Medications: Patient's Medications  New Prescriptions   No medications on file  Previous Medications   ASPIRIN 81 MG TABLET    Take 81 mg by mouth daily.   B COMPLEX VITAMINS (VITAMIN B COMPLEX PO)    Take 1 tablet by mouth daily.    CHOLECALCIFEROL (VITAMIN D) 2000 UNITS TABLET    Take 2,000 Units by mouth daily.   CLOBETASOL PROP EMOLLIENT BASE 0.05 % EMOLLIENT CREAM    Apply topically 2 (two) times daily.   FEEDING SUPPLEMENT, ENSURE ENLIVE, (ENSURE ENLIVE) LIQD    Take 237 mLs by mouth 2 (two) times daily between meals.   FINASTERIDE (PROSCAR) 5 MG TABLET    Take 5 mg by mouth daily.   FUROSEMIDE (LASIX) 20 MG TABLET    Take 0.5 tablets (10 mg total) by mouth daily.   LEVOTHYROXINE (SYNTHROID, LEVOTHROID) 125 MCG TABLET    Take 1 tablet (125 mcg total) by mouth daily.   LOSARTAN (COZAAR) 50 MG TABLET    Take 1/2 tablet by mouth once daily to strengthen heart and to control blood pressure   MULTIPLE VITAMIN (MULTIVITAMIN) TABLET    Take 1 tablet by mouth daily.   MUPIROCIN OINTMENT (BACTROBAN) 2 %    APPLY TO LEFT KNEE WOUND DAILY   NYSTATIN-TRIAMCINOLONE (MYCOLOG II) CREAM    APPLY TO LOWER LEFT EXTREMITY TWICE A DAY   RESVERATROL 250 MG CAPS    Take 250 mg  by mouth daily.   TAMSULOSIN HCL (FLOMAX) 0.4 MG CAPS    Take 0.4 mg by mouth daily.   VITAMIN C (ASCORBIC ACID) 500 MG TABLET    Take 500 mg by mouth daily.   VITAMIN E (VITAMIN E) 400 UNIT CAPSULE    Take 400 Units by mouth daily.  Modified Medications   No medications on file  Discontinued Medications   No medications on file    Review of Systems  Constitutional: Negative for chills, diaphoresis and fever.  HENT: Positive for hearing loss. Negative for congestion, ear discharge, ear pain, nosebleeds and tinnitus.   Eyes: Negative for photophobia, pain, discharge and redness.  Respiratory: Negative for cough, shortness of breath, wheezing and stridor.        Hoarse  Cardiovascular: Positive for leg swelling. Negative for chest pain.       1+ edema BLE  Gastrointestinal: Positive for diarrhea. Negative for abdominal pain, blood in stool, constipation, nausea and vomiting.       FOBT negative. External hemorrhoids.   Endocrine: Negative for polydipsia.  Genitourinary: Positive for frequency. Negative for dysuria, flank pain, hematuria and urgency.  Musculoskeletal: Positive for back pain. Negative for myalgias and neck pain.       R sciatica nerve pain.  History of OP. Treated with Prolia. Ambulates with walker, not straight, last fall a few weeks ago, lost balance, left upper arm ecchymosis is resolving.  Pain in the left shoulder with radiation down the left arm. Variable intensity. Comfortable at rest.  Skin: Negative for rash.       Spontaneous ecchymosis. Left forehead hematoma, sutures x3 removed, ecchymosis left face and neck. LLE erythema, swelling, warmth, tenderness. Left knee abrasion.   Allergic/Immunologic: Negative for environmental allergies.  Neurological: Negative for dizziness, tremors, seizures, weakness and headaches.  Hematological: Bruises/bleeds easily.       Limbs  Psychiatric/Behavioral: Negative for hallucinations and suicidal ideas. The patient is not  nervous/anxious.     Vitals:   12/02/16 1451  BP: 134/64  Pulse: 73  Temp: 98.3 F (36.8 C)  TempSrc: Oral  SpO2: 97%  Weight: 137 lb (62.1 kg)  Height: _0  (1.651 m)   Body mass index is 22.8 kg/m. Wt Readings from Last 3 Encounters:  12/02/16 137 lb (62.1 kg)  10/22/16 140 lb (63.5 kg)  09/14/16 141 lb 9.6 oz (64.2 kg)      Physical Exam  Constitutional: He is oriented to person, place, and time. He appears well-developed and well-nourished. No distress.  HENT:  Head: Normocephalic and atraumatic.  Right Ear: External ear normal. A middle ear effusion is present.  Left Ear: External ear normal.  No middle ear effusion.  Nose: Nose normal.  Mouth/Throat: Oropharynx is clear and moist. No oropharyngeal exudate.  Eyes: Conjunctivae and EOM are normal. Pupils are equal, round, and reactive to light. Right eye exhibits no discharge. Left eye exhibits no discharge. No scleral icterus.  Neck: Normal range of motion. Neck supple. No JVD present. No tracheal deviation present. No thyromegaly present.  Cardiovascular: Normal rate, regular rhythm, normal heart sounds and intact distal pulses.   No murmur heard. Pulmonary/Chest: Effort normal and breath sounds normal. No stridor. No respiratory distress. He has no wheezes. He has no rales. He exhibits no tenderness.  Hoarse  Abdominal: Soft. Bowel sounds are normal. He exhibits no distension. There is no tenderness. There is no rebound and no guarding.  External hemorrhoids.   Genitourinary: Rectal exam shows guaiac negative stool.  Musculoskeletal: Normal range of motion. He exhibits edema. He exhibits no tenderness.  1+ edema BLE Pain in the left shoulder, but no point tenderness. Uncimfortable with elevation and rotation of the shoulder.  Lymphadenopathy:    He has no cervical adenopathy.  Neurological: He is alert and oriented to person, place, and time. He has normal reflexes. No cranial nerve deficit. He exhibits normal  muscle tone. Coordination normal.  Skin: Skin is warm and dry. No rash noted. He is not diaphoretic. No erythema. No pallor.  Psychiatric: He has a normal mood and affect. His behavior is normal. Judgment and thought content normal.    Labs reviewed: Lab Summary Latest Ref Rng & Units 10/19/2016 10/15/2015 06/11/2015  Hemoglobin 13.2 - 17.1 g/dL 11.2(L) 11.3(A) 11.3(L)  Hematocrit 38.5 - 50.0 % 33.4(L) 34(A) 34.0(L)  White count 3.8 - 10.8 K/uL 8.1 6.3 6.3  Platelet count 140 - 400 K/uL 246 226 (None)  Sodium 135 - 146 mmol/L 136 139 139  Potassium 3.5 - 5.3 mmol/L 4.3 4.4 4.8  Calcium 8.6 - 10.3 mg/dL 9.4 (None) 9.6  Phosphorus - (None) (None) (None)  Creatinine 0.70 -  1.11 mg/dL 1.01 1.1 0.85  AST 10 - 35 U/L _0 Alk Phos 40 - 115 U/L 71 38 50  Bilirubin 0.2 - 1.2 mg/dL 0.5 (None) 0.3  Glucose 65 - 99 mg/dL 96 90 158(H)  Cholesterol - (None) (None) (None)  HDL cholesterol - (None) (None) (None)  Triglycerides - (None) (None) (None)  LDL Direct - (None) (None) (None)  LDL Calc - (None) (None) (None)  Total protein 6.1 - 8.1 g/dL 6.0(L) (None) (None)  Albumin 3.6 - 5.1 g/dL 3.7 (None) 4.0  Some recent data might be hidden   Lab Results  Component Value Date   TSH 0.16 (L) 10/19/2016   TSH 1.47 10/15/2015   TSH 1.430 06/11/2015   Lab Results  Component Value Date   BUN 26 (H) 10/19/2016   BUN 24 (A) 10/15/2015   BUN 17 06/11/2015   No results found for: HGBA1C  Assessment/Plan  1. Acute pain of left shoulder - Physical therapy at [facility] - ibuprofen (ADVIL) 200 MG tablet; Take one tablet at breakfast and supper  Dispense: 30 tablet; Refill: 0  If he does not note improvement within 2 weeks, he will call for referral to Dr. French Ana.

## 2016-12-07 DIAGNOSIS — N401 Enlarged prostate with lower urinary tract symptoms: Secondary | ICD-10-CM | POA: Diagnosis not present

## 2016-12-07 DIAGNOSIS — R3915 Urgency of urination: Secondary | ICD-10-CM | POA: Diagnosis not present

## 2016-12-08 DIAGNOSIS — M545 Low back pain: Secondary | ICD-10-CM | POA: Diagnosis not present

## 2016-12-08 DIAGNOSIS — R2681 Unsteadiness on feet: Secondary | ICD-10-CM | POA: Diagnosis not present

## 2016-12-08 DIAGNOSIS — R293 Abnormal posture: Secondary | ICD-10-CM | POA: Diagnosis not present

## 2016-12-08 DIAGNOSIS — M79604 Pain in right leg: Secondary | ICD-10-CM | POA: Diagnosis not present

## 2016-12-09 DIAGNOSIS — R2681 Unsteadiness on feet: Secondary | ICD-10-CM | POA: Diagnosis not present

## 2016-12-09 DIAGNOSIS — M545 Low back pain: Secondary | ICD-10-CM | POA: Diagnosis not present

## 2016-12-09 DIAGNOSIS — M79604 Pain in right leg: Secondary | ICD-10-CM | POA: Diagnosis not present

## 2016-12-09 DIAGNOSIS — R293 Abnormal posture: Secondary | ICD-10-CM | POA: Diagnosis not present

## 2016-12-11 DIAGNOSIS — M545 Low back pain: Secondary | ICD-10-CM | POA: Diagnosis not present

## 2016-12-11 DIAGNOSIS — R293 Abnormal posture: Secondary | ICD-10-CM | POA: Diagnosis not present

## 2016-12-11 DIAGNOSIS — M79604 Pain in right leg: Secondary | ICD-10-CM | POA: Diagnosis not present

## 2016-12-11 DIAGNOSIS — R2681 Unsteadiness on feet: Secondary | ICD-10-CM | POA: Diagnosis not present

## 2016-12-14 ENCOUNTER — Other Ambulatory Visit: Payer: Self-pay | Admitting: Internal Medicine

## 2016-12-15 DIAGNOSIS — M545 Low back pain: Secondary | ICD-10-CM | POA: Diagnosis not present

## 2016-12-15 DIAGNOSIS — M79604 Pain in right leg: Secondary | ICD-10-CM | POA: Diagnosis not present

## 2016-12-15 DIAGNOSIS — R293 Abnormal posture: Secondary | ICD-10-CM | POA: Diagnosis not present

## 2016-12-15 DIAGNOSIS — R2681 Unsteadiness on feet: Secondary | ICD-10-CM | POA: Diagnosis not present

## 2016-12-18 DIAGNOSIS — M79604 Pain in right leg: Secondary | ICD-10-CM | POA: Diagnosis not present

## 2016-12-18 DIAGNOSIS — R293 Abnormal posture: Secondary | ICD-10-CM | POA: Diagnosis not present

## 2016-12-18 DIAGNOSIS — R2681 Unsteadiness on feet: Secondary | ICD-10-CM | POA: Diagnosis not present

## 2016-12-18 DIAGNOSIS — M545 Low back pain: Secondary | ICD-10-CM | POA: Diagnosis not present

## 2016-12-23 DIAGNOSIS — R293 Abnormal posture: Secondary | ICD-10-CM | POA: Diagnosis not present

## 2016-12-23 DIAGNOSIS — R2681 Unsteadiness on feet: Secondary | ICD-10-CM | POA: Diagnosis not present

## 2016-12-23 DIAGNOSIS — M545 Low back pain: Secondary | ICD-10-CM | POA: Diagnosis not present

## 2016-12-23 DIAGNOSIS — M79604 Pain in right leg: Secondary | ICD-10-CM | POA: Diagnosis not present

## 2016-12-25 DIAGNOSIS — R2681 Unsteadiness on feet: Secondary | ICD-10-CM | POA: Diagnosis not present

## 2016-12-25 DIAGNOSIS — M79604 Pain in right leg: Secondary | ICD-10-CM | POA: Diagnosis not present

## 2016-12-25 DIAGNOSIS — M545 Low back pain: Secondary | ICD-10-CM | POA: Diagnosis not present

## 2016-12-25 DIAGNOSIS — R293 Abnormal posture: Secondary | ICD-10-CM | POA: Diagnosis not present

## 2016-12-30 DIAGNOSIS — R293 Abnormal posture: Secondary | ICD-10-CM | POA: Diagnosis not present

## 2016-12-30 DIAGNOSIS — M545 Low back pain: Secondary | ICD-10-CM | POA: Diagnosis not present

## 2016-12-30 DIAGNOSIS — R2681 Unsteadiness on feet: Secondary | ICD-10-CM | POA: Diagnosis not present

## 2016-12-30 DIAGNOSIS — M79604 Pain in right leg: Secondary | ICD-10-CM | POA: Diagnosis not present

## 2017-01-01 DIAGNOSIS — R293 Abnormal posture: Secondary | ICD-10-CM | POA: Diagnosis not present

## 2017-01-01 DIAGNOSIS — M79604 Pain in right leg: Secondary | ICD-10-CM | POA: Diagnosis not present

## 2017-01-01 DIAGNOSIS — M545 Low back pain: Secondary | ICD-10-CM | POA: Diagnosis not present

## 2017-01-01 DIAGNOSIS — R2681 Unsteadiness on feet: Secondary | ICD-10-CM | POA: Diagnosis not present

## 2017-01-07 DIAGNOSIS — R293 Abnormal posture: Secondary | ICD-10-CM | POA: Diagnosis not present

## 2017-01-07 DIAGNOSIS — M545 Low back pain: Secondary | ICD-10-CM | POA: Diagnosis not present

## 2017-01-07 DIAGNOSIS — R2681 Unsteadiness on feet: Secondary | ICD-10-CM | POA: Diagnosis not present

## 2017-01-07 DIAGNOSIS — M79604 Pain in right leg: Secondary | ICD-10-CM | POA: Diagnosis not present

## 2017-01-12 ENCOUNTER — Other Ambulatory Visit: Payer: Self-pay

## 2017-01-12 MED ORDER — LOSARTAN POTASSIUM 50 MG PO TABS: ORAL_TABLET | ORAL | 3 refills | Status: DC

## 2017-01-12 NOTE — Telephone Encounter (Signed)
Patient called and requested a refill of losartan. Rx was sent to [facility] delivery.

## 2017-01-13 DIAGNOSIS — E039 Hypothyroidism, unspecified: Secondary | ICD-10-CM | POA: Diagnosis not present

## 2017-01-14 ENCOUNTER — Other Ambulatory Visit: Payer: Medicare Other

## 2017-01-14 LAB — TSH: TSH: 4.86 mIU/L — ABNORMAL HIGH (ref 0.40–4.50)

## 2017-01-20 ENCOUNTER — Telehealth: Payer: Self-pay | Admitting: *Deleted

## 2017-01-20 DIAGNOSIS — E039 Hypothyroidism, unspecified: Secondary | ICD-10-CM

## 2017-01-20 NOTE — Telephone Encounter (Signed)
-----   Message from Man Otho Darner, NP sent at 01/18/2017  9:52 AM EDT ----- Regarding: TSH Would you let the patient know his TSH 4.86 5.31.18 and schedule an repeated TSH in 3 months. Thank you

## 2017-01-20 NOTE — Telephone Encounter (Signed)
Spoke with patient regarding his TSH results, he stated that he understood and had no questions at this time. I did remind him that he had an appointment with her on 02/04/17.

## 2017-02-03 ENCOUNTER — Other Ambulatory Visit: Payer: Self-pay | Admitting: Dermatology

## 2017-02-03 DIAGNOSIS — C44329 Squamous cell carcinoma of skin of other parts of face: Secondary | ICD-10-CM | POA: Diagnosis not present

## 2017-02-03 DIAGNOSIS — L570 Actinic keratosis: Secondary | ICD-10-CM | POA: Diagnosis not present

## 2017-02-03 DIAGNOSIS — C44519 Basal cell carcinoma of skin of other part of trunk: Secondary | ICD-10-CM | POA: Diagnosis not present

## 2017-02-03 DIAGNOSIS — D229 Melanocytic nevi, unspecified: Secondary | ICD-10-CM | POA: Diagnosis not present

## 2017-02-03 DIAGNOSIS — C44319 Basal cell carcinoma of skin of other parts of face: Secondary | ICD-10-CM | POA: Diagnosis not present

## 2017-02-04 ENCOUNTER — Non-Acute Institutional Stay: Payer: Medicare Other | Admitting: Nurse Practitioner

## 2017-02-04 ENCOUNTER — Encounter: Payer: Self-pay | Admitting: Nurse Practitioner

## 2017-02-04 DIAGNOSIS — E876 Hypokalemia: Secondary | ICD-10-CM | POA: Diagnosis not present

## 2017-02-04 DIAGNOSIS — R609 Edema, unspecified: Secondary | ICD-10-CM | POA: Diagnosis not present

## 2017-02-04 DIAGNOSIS — E039 Hypothyroidism, unspecified: Secondary | ICD-10-CM

## 2017-02-04 DIAGNOSIS — N400 Enlarged prostate without lower urinary tract symptoms: Secondary | ICD-10-CM | POA: Diagnosis not present

## 2017-02-04 DIAGNOSIS — M25512 Pain in left shoulder: Secondary | ICD-10-CM | POA: Diagnosis not present

## 2017-02-04 DIAGNOSIS — I10 Essential (primary) hypertension: Secondary | ICD-10-CM | POA: Diagnosis not present

## 2017-02-04 DIAGNOSIS — D649 Anemia, unspecified: Secondary | ICD-10-CM | POA: Diagnosis not present

## 2017-02-04 NOTE — Progress Notes (Signed)
Location:  [facility] of Service:  Clinic (12) Provider:  Nadira Single, Manxie  NP  Blanchie Serve, MD  Patient Care Team: Blanchie Serve, MD as PCP - General (Internal Medicine) [place] Paiden Caraveo X, NP as Nurse Practitioner (Nurse Practitioner) Earlie Server, MD as Consulting Physician (Orthopedic Surgery) Jovita Gamma, MD as Consulting Physician (Neurosurgery) Rutherford Guys, MD as Consulting Physician (Ophthalmology) Jettie Booze, MD as Consulting Physician (Cardiology) Marchia Bond, MD as Consulting Physician (Orthopedic Surgery) Hurman Horn, MD as Consulting Physician (Ophthalmology) Verner Chol, MD as Consulting Physician (Sports Medicine)  Extended Emergency Contact Information Primary Emergency Contact: Cory Burton Address: [address]          [place], [place] [address] [place] Phone: [phone] Relation: Spouse  Code Status:  DNR Goals of care: Advanced Directive information Advanced Directives 02/04/2017  Does Patient Have a Medical Advance Directive? Yes  Type of Paramedic of [place];Living will  Does patient want to make changes to medical advance directive? No - Patient declined  Copy of [place] in Chart? Yes  Would patient like information on creating a medical advance directive? -  Pre-existing out of facility DNR order (yellow form or pink MOST form) -     Chief Complaint  Patient presents with  . Medical Management of Chronic Issues    HPI:  Pt is a 82 y.o. male seen today for evaluation of chronic medical conditions.   Hx of BPH, no urinary retention while on Finasteride and Tamsulosin. Chronic edema, mainly in the left lower leg,  denied cough or paroxysmal nocturnal orthopnea, takes Furosemide 10mg  daily, Bun/creat 26/1/1.01 10/19/16,  hypothyroidism, takes Levothyroxine 131mcg, TSH 4.86 01/13/17, hiis blood pressure is  controlled on Losartan 25mg . 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 Acute posthemorrhagic anemia 12/23/2011  . Anemia 03/06/2015  . BPH (benign prostatic hyperplasia)   . Cardiomegaly 12/24/2011  . Degeneration of thoracic or thoracolumbar intervertebral disc 12/24/2011  . Edema 04/28/2012  . Fracture, intertrochanteric, left femur ([place]) 12/17/2011  . Hypertension   . Other abnormal blood chemistry 12/24/2011  . Senile osteoporosis 08/25/2012  . Spinal stenosis, lumbar region, without neurogenic claudication 12/24/2011  . Thyroid disease   . Unspecified hearing loss 12/24/2011  . Unspecified hypothyroidism 12/23/2011   Past Surgical History:  Procedure Laterality Date  . COMPRESSION HIP SCREW  12/17/2011   Procedure: COMPRESSION HIP;  Surgeon: Johnny Bridge, MD;  Location: [facility];  Service: Orthopedics;  Laterality: Left;  . FOOT SURGERY      Allergies  Allergen Reactions  . Clindamycin/Lincomycin     Developed C. dificile  . Horse-Derived Products     Horse serum  . Mold Extract [Trichophyton]     Allergies as of 02/04/2017      Reactions   Clindamycin/lincomycin    Developed C. dificile   Horse-derived Products    Horse serum   Mold Extract [trichophyton]       Medication List       Accurate as of 02/04/17  2:32 PM. Always use your most recent med list.          aspirin 81 MG tablet Take 81 mg by mouth daily.   Clobetasol Prop Emollient Base 0.05 % emollient cream Apply topically 2 (two) times daily.   feeding supplement (ENSURE ENLIVE)  Liqd Take 237 mLs by mouth 2 (two) times daily between meals.   finasteride 5 MG tablet Commonly known as:  PROSCAR Take 5 mg by mouth daily.   furosemide 20 MG tablet Commonly known as:  LASIX TAKE 0.5 TABLETS (10 MG TOTAL) BY MOUTH DAILY.   ibuprofen 200 MG tablet Commonly known as:  ADVIL Take one tablet at breakfast and supper     levothyroxine 125 MCG tablet Commonly known as:  SYNTHROID, LEVOTHROID Take 1 tablet (125 mcg total) by mouth daily.   losartan 50 MG tablet Commonly known as:  COZAAR Take 1/2 tablet by mouth once daily to strengthen heart and to control blood pressure   multivitamin tablet Take 1 tablet by mouth daily.   mupirocin ointment 2 % Commonly known as:  BACTROBAN APPLY TO LEFT KNEE WOUND DAILY   nystatin-triamcinolone cream Commonly known as:  MYCOLOG II APPLY TO LOWER LEFT EXTREMITY TWICE A DAY   Resveratrol 250 MG Caps Take 250 mg by mouth daily.   tamsulosin 0.4 MG Caps capsule Commonly known as:  FLOMAX Take 0.4 mg by mouth daily.   VITAMIN B COMPLEX PO Take 1 tablet by mouth daily.   vitamin C 500 MG tablet Commonly known as:  ASCORBIC ACID Take 500 mg by mouth daily.   Vitamin D 2000 units tablet Take 2,000 Units by mouth daily.   vitamin E 400 UNIT capsule Generic drug:  vitamin E Take 400 Units by mouth daily.       Review of Systems  Constitutional: Negative for chills, diaphoresis and fever.  HENT: Positive for hearing loss. Negative for congestion, ear discharge, ear pain, nosebleeds and tinnitus.   Eyes: Negative for photophobia, pain, discharge and redness.  Respiratory: Negative for cough, shortness of breath, wheezing and stridor.        Hoarse  Cardiovascular: Positive for leg swelling. Negative for chest pain.       Trace edema mainly in the left lower leg.   Gastrointestinal: Negative for abdominal pain, blood in stool, constipation, diarrhea, nausea and vomiting.       External hemorrhoids.  Endocrine: Negative for polydipsia.  Genitourinary: Positive for frequency. Negative for dysuria, flank pain, hematuria and urgency.  Musculoskeletal: Positive for back pain. Negative for myalgias and neck pain.       R sciatica nerve pain.  History of OP. Treated with Prolia. Ambulates with walker, not straight,  left scapular, right leg pain  Skin:  Negative for rash.       Spontaneous ecchymosis. Right temporal area skin lesion excisions x2, healing nicely.   Allergic/Immunologic: Negative for environmental allergies.  Neurological: Negative for dizziness, tremors, seizures, weakness and headaches.  Hematological: Bruises/bleeds easily.       Limbs  Psychiatric/Behavioral: Negative for hallucinations and suicidal ideas. The patient is not nervous/anxious.     Immunization History  Administered Date(s) Administered  . Influenza Whole 05/17/2012  . Influenza, High Dose Seasonal PF 05/18/2016  . Influenza-Unspecified 05/17/2013, 05/17/2014, 05/10/2015  . Pneumococcal Polysaccharide-23 05/18/2011   Pertinent  Health Maintenance Due  Topic Date Due  . PNA vac Low Risk Adult (2 of 2 - PCV13) 05/17/2012  . INFLUENZA VACCINE  03/17/2017  . DEXA SCAN  05/15/2018   Fall Risk  09/14/2016 10/24/2015 06/11/2015 03/06/2015 02/20/2015  Falls in the past year? Yes Yes No Yes Yes  Number falls in past yr: 1 1 - 1 1  Injury with Fall? Yes No - No Yes   Functional Status Survey:  Vitals:   02/04/17 1333  BP: (!) 112/52  Pulse: 80  Resp: 20  Temp: 98.2 F (36.8 C)  TempSrc: Oral  Weight: 132 lb 12.8 oz (60.2 kg)  Height: 5\' 5"  (1.651 m)   Body mass index is 22.1 kg/m. Physical Exam  Constitutional: He is oriented to person, place, and time. He appears well-developed and well-nourished. No distress.  HENT:  Head: Normocephalic and atraumatic.  Right Ear: External ear normal. A middle ear effusion is present.  Left Ear: External ear normal.  No middle ear effusion.  Nose: Nose normal.  Mouth/Throat: Oropharynx is clear and moist. No oropharyngeal exudate.  Eyes: Conjunctivae and EOM are normal. Pupils are equal, round, and reactive to light. Right eye exhibits no discharge. Left eye exhibits no discharge. No scleral icterus.  Neck: Normal range of motion. Neck supple. No JVD present. No tracheal deviation present. No thyromegaly  present.  Cardiovascular: Normal rate, regular rhythm, normal heart sounds and intact distal pulses.   No murmur heard. Pulmonary/Chest: Effort normal and breath sounds normal. No stridor. No respiratory distress. He has no wheezes. He has no rales. He exhibits no tenderness.  Hoarse  Abdominal: Soft. Bowel sounds are normal. He exhibits no distension. There is no tenderness. There is no rebound and no guarding.  External hemorrhoids.   Genitourinary: Rectal exam shows guaiac negative stool.  Musculoskeletal: Normal range of motion. He exhibits edema. He exhibits no tenderness.  Trace edema right lower leg  Lymphadenopathy:    He has no cervical adenopathy.  Neurological: He is alert and oriented to person, place, and time. He has normal reflexes. No cranial nerve deficit. He exhibits normal muscle tone. Coordination normal.  Skin: Skin is warm and dry. No rash noted. He is not diaphoretic. No erythema. No pallor.  Spontaneous ecchymosis. Right temporal area skin lesion excisions x2, healing nicely.   Psychiatric: He has a normal mood and affect. His behavior is normal. Judgment and thought content normal.  Stated his appetite has been poor since his wife passed away    Labs reviewed:  Recent Labs  10/19/16 0001  NA 136  K 4.3  CL 105  CO2 25  GLUCOSE 96  BUN 26*  CREATININE 1.01  CALCIUM 9.4    Recent Labs  10/19/16 0001  AST 20  ALT 9  ALKPHOS 71  BILITOT 0.5  PROT 6.0*  ALBUMIN 3.7    Recent Labs  10/19/16 0001  WBC 8.1  HGB 11.2*  HCT 33.4*  MCV 101.2*  PLT 246   Lab Results  Component Value Date   TSH 4.86 (H) 01/13/2017   No results found for: HGBA1C No results found for: CHOL, HDL, LDLCALC, LDLDIRECT, TRIG, CHOLHDL  Significant Diagnostic Results in last 30 days:  No results found.  Assessment/Plan Hypothyroidism 01/14/17 TSH 4.86 02/03/17 increased Levothyroxine 17mcg, 2 on Sunday, 1 daily Monday through Saturday. Scheduled TSH Aug 2018.    HTN (hypertension) Controlled, continue Losartan 25mg , 10/19/16 Na 136, K 4.3, Bun 26, creat 1.01.   BPH (benign prostatic hyperplasia) No urinary retention, continue Tamsulosin and Finasteride. 1-2x/night bathroom trips. 10/23/14 Urology: asymptomatic    Edema Trace edema left lower leg, no SOB or cough, continue Furosemide 10mg  daily,  Hypokalemia Normalized, last K 4.3 10/19/16  Anemia Last Hgb 11.2 10/19/16  Shoulder pain, left Resolved after working with PT     Family/ staff Communication: continue IL, f/u 6 months.   Labs/tests ordered:  TSH scheduled Aug 2018

## 2017-02-04 NOTE — Assessment & Plan Note (Addendum)
Trace edema left lower leg, no SOB or cough, continue Furosemide 10mg  daily,

## 2017-02-04 NOTE — Assessment & Plan Note (Signed)
Controlled, continue Losartan 25mg , 10/19/16 Na 136, K 4.3, Bun 26, creat 1.01.

## 2017-02-04 NOTE — Assessment & Plan Note (Signed)
01/14/17 TSH 4.86 02/03/17 increased Levothyroxine 166mcg, 2 on Sunday, 1 daily Monday through Saturday. Scheduled TSH Aug 2018.

## 2017-02-04 NOTE — Assessment & Plan Note (Signed)
Normalized, last K 4.3 10/19/16

## 2017-02-04 NOTE — Assessment & Plan Note (Signed)
No urinary retention, continue Tamsulosin and Finasteride. 1-2x/night bathroom trips. 10/23/14 Urology: asymptomatic

## 2017-02-04 NOTE — Assessment & Plan Note (Signed)
Last Hgb 11.2 10/19/16

## 2017-02-04 NOTE — Assessment & Plan Note (Signed)
Resolved after working with PT

## 2017-02-16 ENCOUNTER — Other Ambulatory Visit: Payer: Self-pay | Admitting: Internal Medicine

## 2017-02-21 DIAGNOSIS — Z23 Encounter for immunization: Secondary | ICD-10-CM | POA: Diagnosis not present

## 2017-02-24 ENCOUNTER — Other Ambulatory Visit: Payer: Self-pay | Admitting: Internal Medicine

## 2017-03-04 ENCOUNTER — Other Ambulatory Visit: Payer: Self-pay | Admitting: Dermatology

## 2017-03-04 DIAGNOSIS — C44329 Squamous cell carcinoma of skin of other parts of face: Secondary | ICD-10-CM | POA: Diagnosis not present

## 2017-03-04 DIAGNOSIS — L988 Other specified disorders of the skin and subcutaneous tissue: Secondary | ICD-10-CM | POA: Diagnosis not present

## 2017-03-09 ENCOUNTER — Encounter: Payer: Self-pay | Admitting: Nurse Practitioner

## 2017-03-09 DIAGNOSIS — Z85828 Personal history of other malignant neoplasm of skin: Secondary | ICD-10-CM | POA: Insufficient documentation

## 2017-03-12 DIAGNOSIS — Z4802 Encounter for removal of sutures: Secondary | ICD-10-CM | POA: Diagnosis not present

## 2017-04-08 DIAGNOSIS — C44319 Basal cell carcinoma of skin of other parts of face: Secondary | ICD-10-CM | POA: Diagnosis not present

## 2017-04-15 ENCOUNTER — Encounter: Payer: Self-pay | Admitting: Nurse Practitioner

## 2017-04-15 ENCOUNTER — Ambulatory Visit: Payer: Medicare Other | Admitting: Nurse Practitioner

## 2017-04-15 DIAGNOSIS — H00019 Hordeolum externum unspecified eye, unspecified eyelid: Secondary | ICD-10-CM | POA: Insufficient documentation

## 2017-04-15 DIAGNOSIS — H00011 Hordeolum externum right upper eyelid: Secondary | ICD-10-CM

## 2017-04-15 DIAGNOSIS — E039 Hypothyroidism, unspecified: Secondary | ICD-10-CM | POA: Diagnosis not present

## 2017-04-15 DIAGNOSIS — R269 Unspecified abnormalities of gait and mobility: Secondary | ICD-10-CM | POA: Diagnosis not present

## 2017-04-15 LAB — TSH: TSH: 0.87 mIU/L (ref 0.40–4.50)

## 2017-04-15 NOTE — Assessment & Plan Note (Signed)
A sty @ right inner corner of the right upper eyelid, the patient was instructed to apply warm compress 36min/each, up to 4 x/day. Observe.

## 2017-04-15 NOTE — Patient Instructions (Signed)
Referral to Neurology, schedule TSH 3 months.

## 2017-04-15 NOTE — Assessment & Plan Note (Addendum)
04/15/17 TSH 0.7 no change of Levothyroxine. Update TSH 3 months.

## 2017-04-15 NOTE — Progress Notes (Addendum)
Location:   [facility]   Place of Service:   clinic [facility]  Provider: Marlana Latus NP  Code Status: DNR Goals of Care:  Advanced Directives 04/15/2017  Does Patient Have a Medical Advance Directive? Yes  Type of Paramedic of [place];Living will  Does patient want to make changes to medical advance directive? No - Patient declined  Copy of [place] in Chart? Yes  Would patient like information on creating a medical advance directive? -  Pre-existing out of facility DNR order (yellow form or pink MOST form) -     Chief Complaint  Patient presents with  . Acute Visit    Right eye irratation, ? stye    HPI: Patient is a 83 y.o. male seen today for an acute visit for the right upper eyelid  inner corner pain for x 1 day, denied vision change, denied eye pain, no noted eye redness or discharge.   Today's f/u TSH wnl, 0.86.   He is also concern about his effort to walk straight with walker, denied headache, dizziness, tremor, focal weakness, gradual onset, duration about 3 years. Last CT head 06/15/16, showed no acute intracranial findings.   Past Medical History:  Diagnosis Date  . Acute posthemorrhagic anemia 12/23/2011  . Anemia 03/06/2015  . BPH (benign prostatic hyperplasia)   . Cardiomegaly 12/24/2011  . Degeneration of thoracic or thoracolumbar intervertebral disc 12/24/2011  . Edema 04/28/2012  . Fracture, intertrochanteric, left femur ([place]) 12/17/2011  . Hypertension   . Other abnormal blood chemistry 12/24/2011  . Senile osteoporosis 08/25/2012  . Spinal stenosis, lumbar region, without neurogenic claudication 12/24/2011  . Thyroid disease   . Unspecified hearing loss 12/24/2011  . Unspecified hypothyroidism 12/23/2011    Past Surgical History:  Procedure Laterality Date  . COMPRESSION HIP SCREW  12/17/2011   Procedure: COMPRESSION HIP;  Surgeon: Johnny Bridge, MD;  Location: [facility];  Service: Orthopedics;  Laterality: Left;  . FOOT SURGERY       Allergies  Allergen Reactions  . Clindamycin/Lincomycin     Developed C. dificile  . Horse-Derived Products     Horse serum  . Mold Extract [Trichophyton]     Allergies as of 04/15/2017      Reactions   Clindamycin/lincomycin    Developed C. dificile   Horse-derived Products    Horse serum   Mold Extract [trichophyton]       Medication List       Accurate as of 04/15/17 11:59 PM. Always use your most recent med list.          aspirin 81 MG tablet Take 81 mg by mouth daily.   Clobetasol Prop Emollient Base 0.05 % emollient cream Apply topically 2 (two) times daily.   feeding supplement (ENSURE ENLIVE) Liqd Take 237 mLs by mouth 2 (two) times daily between meals.   finasteride 5 MG tablet Commonly known as:  PROSCAR Take 5 mg by mouth daily.   furosemide 20 MG tablet Commonly known as:  LASIX TAKE 0.5 TABLETS (10 MG TOTAL) BY MOUTH DAILY.   ibuprofen 200 MG tablet Commonly known as:  ADVIL Take one tablet at breakfast and supper   levothyroxine 125 MCG tablet Commonly known as:  SYNTHROID, LEVOTHROID Take 1 tablet (125 mcg total) by mouth daily.   losartan 50 MG tablet Commonly known as:  COZAAR TAKE 1/2 TABLET DAILY TO   STRENGTHEN HEART AND       CONTROL BLOOD PRESSURE  multivitamin tablet Take 1 tablet by mouth daily.   mupirocin ointment 2 % Commonly known as:  BACTROBAN APPLY TO LEFT KNEE WOUND DAILY   nystatin-triamcinolone cream Commonly known as:  MYCOLOG II APPLY TO LOWER LEFT EXTREMITY TWICE A DAY   Resveratrol 250 MG Caps Take 250 mg by mouth daily.   tamsulosin 0.4 MG Caps capsule Commonly known as:  FLOMAX Take 0.4 mg by mouth daily.   VITAMIN B COMPLEX PO Take 1 tablet by mouth daily.   vitamin C 500 MG tablet Commonly known as:  ASCORBIC ACID Take 500 mg by mouth daily.   Vitamin D 2000 units tablet Take 2,000 Units by mouth daily.   vitamin E 400 UNIT capsule Generic drug:  vitamin E Take 400 Units by mouth  daily.            Discharge Care Instructions        Start     Ordered   04/15/17 0000  TSH     04/15/17 1509   04/15/17 0000  Ambulatory referral to Neurology    Comments:  An appointment is requested in approximately: one month   04/15/17 1509      Review of Systems:  Review of Systems  Constitutional: Negative for activity change, appetite change, chills, diaphoresis, fatigue and fever.  HENT: Positive for hearing loss. Negative for congestion.   Eyes: Positive for pain.       Right upper eyelid pain.   Endocrine: Negative for cold intolerance and heat intolerance.  Musculoskeletal: Positive for gait problem. Negative for arthralgias, back pain, joint swelling, myalgias, neck pain and neck stiffness.       Walking with walker, takes an effort to walk straight  Neurological: Negative for dizziness, tremors, seizures, syncope, facial asymmetry, speech difficulty, weakness, light-headedness, numbness and headaches.    Health Maintenance  Topic Date Due  . TETANUS/TDAP  04/06/1937  . PNA vac Low Risk Adult (2 of 2 - PCV13) 05/17/2012  . INFLUENZA VACCINE  03/17/2017  . DEXA SCAN  05/15/2018    Physical Exam: Vitals:   04/15/17 1408  BP: 114/60  Pulse: 63  Resp: 18  Temp: 97.7 F (36.5 C)  TempSrc: Oral  SpO2: 98%  Weight: 132 lb (59.9 kg)  Height: 5\' 5"  (1.651 m)   Body mass index is 21.97 kg/m. Physical Exam  Constitutional: He is oriented to person, place, and time. He appears well-developed and well-nourished. No distress.  HENT:  Head: Normocephalic and atraumatic.  Eyes: Pupils are equal, round, and reactive to light. Conjunctivae and EOM are normal. Right eye exhibits no discharge. Left eye exhibits no discharge. No scleral icterus.  A sty @ the right upper eyelid  inner corner  Cardiovascular: Normal rate, regular rhythm and normal heart sounds.   Musculoskeletal: Normal range of motion. He exhibits no edema or tenderness.  Neurological: He is  alert and oriented to person, place, and time. He displays normal reflexes. No cranial nerve deficit. He exhibits normal muscle tone. Coordination normal.  Skin: He is not diaphoretic.    Labs reviewed: Basic Metabolic Panel:  Recent Labs  10/19/16 0001 01/13/17 0801 04/15/17 0745  NA 136  --   --   K 4.3  --   --   CL 105  --   --   CO2 25  --   --   GLUCOSE 96  --   --   BUN 26*  --   --   CREATININE 1.01  --   --  CALCIUM 9.4  --   --   TSH 0.16* 4.86* 0.87   Liver Function Tests:  Recent Labs  10/19/16 0001  AST 20  ALT 9  ALKPHOS 71  BILITOT 0.5  PROT 6.0*  ALBUMIN 3.7   No results for input(s): LIPASE, AMYLASE in the last 8760 hours. No results for input(s): AMMONIA in the last 8760 hours. CBC:  Recent Labs  10/19/16 0001  WBC 8.1  HGB 11.2*  HCT 33.4*  MCV 101.2*  PLT 246   Lipid Panel: No results for input(s): CHOL, HDL, LDLCALC, TRIG, CHOLHDL, LDLDIRECT in the last 8760 hours. No results found for: HGBA1C  Procedures since last visit: No results found.  Assessment/Plan Abnormality of gait He is also concern about his effort to walk straight with walker, denied headache, dizziness, tremor, focal weakness, gradual onset, duration about 3 years. Last CT head 06/15/16, showed no acute intracranial findings.  May consider Neurology evaluation if the patient desires.   Stye A sty @ right inner corner of the right upper eyelid, the patient was instructed to apply warm compress 63min/each, up to 4 x/day. Observe.   Hypothyroidism 04/15/17 TSH 0.7 no change of Levothyroxine. Update TSH 3 months.     Labs/tests ordered: TSH 3 months  Next appt:  08/05/2017  Plan of care reviewed with the patient, Neurology evaluation

## 2017-04-15 NOTE — Assessment & Plan Note (Signed)
He is also concern about his effort to walk straight with walker, denied headache, dizziness, tremor, focal weakness, gradual onset, duration about 3 years. Last CT head 06/15/16, showed no acute intracranial findings.  May consider Neurology evaluation if the patient desires.

## 2017-04-16 ENCOUNTER — Encounter: Payer: Self-pay | Admitting: Neurology

## 2017-05-14 DIAGNOSIS — Z23 Encounter for immunization: Secondary | ICD-10-CM | POA: Diagnosis not present

## 2017-06-21 ENCOUNTER — Telehealth: Payer: Self-pay | Admitting: *Deleted

## 2017-06-21 ENCOUNTER — Encounter: Payer: Self-pay | Admitting: *Deleted

## 2017-06-21 DIAGNOSIS — E039 Hypothyroidism, unspecified: Secondary | ICD-10-CM

## 2017-06-21 NOTE — Telephone Encounter (Signed)
Called patient regarding his thyroid medication directions, LMOM for him to return call to clinic.

## 2017-06-23 ENCOUNTER — Telehealth: Payer: Self-pay | Admitting: *Deleted

## 2017-06-23 NOTE — Telephone Encounter (Signed)
Called to try an catch Cory Burton so that I could confirm that he was taking his levothyroxine medication correctly. I was unable to speak with him, will try again tomorrow.

## 2017-06-24 ENCOUNTER — Telehealth: Payer: Self-pay | Admitting: *Deleted

## 2017-06-24 NOTE — Telephone Encounter (Signed)
Cory Burton came to see me in the clinic today with his bottle of 0.137 mcg of levothyroxine, he stated that he ordered this medication in error and ask if I could discard it for him. I ask if he had taken any of this medication and he stated that it has not even been opened, I told him there is a possibility that they would take it back because the seal has not been broken. After speaking with [facility] they stated that they would send a package to return the medication to them

## 2017-06-24 NOTE — Telephone Encounter (Signed)
Spoke with Cory Burton regarding his thyroid medication, he stated that he had gotten a little confused when he found that old bottle of thyroid medicine. He also stated that he had been taking it correctly, he just needed to dispose of the 137 mg tablets.

## 2017-07-01 ENCOUNTER — Encounter: Payer: Self-pay | Admitting: Nurse Practitioner

## 2017-07-15 ENCOUNTER — Non-Acute Institutional Stay: Payer: Medicare Other | Admitting: Nurse Practitioner

## 2017-07-15 ENCOUNTER — Encounter: Payer: Self-pay | Admitting: Nurse Practitioner

## 2017-07-15 DIAGNOSIS — I10 Essential (primary) hypertension: Secondary | ICD-10-CM

## 2017-07-15 DIAGNOSIS — R609 Edema, unspecified: Secondary | ICD-10-CM | POA: Diagnosis not present

## 2017-07-15 MED ORDER — FUROSEMIDE 20 MG PO TABS: 20.0000 mg | ORAL_TABLET | Freq: Every day | ORAL | 3 refills | Status: DC

## 2017-07-15 MED ORDER — POTASSIUM CHLORIDE CRYS ER 20 MEQ PO TBCR: 20.0000 meq | EXTENDED_RELEASE_TABLET | Freq: Every day | ORAL | 3 refills | Status: DC

## 2017-07-15 NOTE — Assessment & Plan Note (Signed)
increased leg swelling, increase Furosemide /2010mg  daily, adding Kcl 28meq po daily. , the patient denied cough, SOB, sputum production, chest pain/pressure/palpitation, or paroxysmal nocturnal orthopnea. F/u BMP one week. Encourage compression hosiery daily. Monitor weight at home.

## 2017-07-15 NOTE — Patient Instructions (Addendum)
Increase Furosemide 20mg  po daily, Kcl 10 meq daily. BMP 2 weeks. F/u 4 weeks.

## 2017-07-15 NOTE — Progress Notes (Signed)
Location:   [facility]   Place of Service:  Clinic (12) Provider: Marlana Latus NP  Code Status: DNR Goals of Care: [place]ives 04/15/2017  Does Patient Have a Medical Advance Directive? Yes  Type of Paramedic of [place];Living will  Does patient want to make changes to medical advance directive? No - Patient declined  Copy of [place] in Chart? Yes  Would patient like information on creating a medical advance directive? -  Pre-existing out of facility DNR order (yellow form or pink MOST form) -     Chief Complaint  Patient presents with  . Acute Visit    swelling in right ankle x 1 mo    HPI: Patient is a 83 y.o. male seen today for an acute visit for increased leg swelling about , he takes Furosemide 10mg  daily, the patient denied cough, SOB, sputum production, chest pain/pressure/palpitation, or paroxysmal nocturnal orthopnea. History of HTN, blood pressure is controlled on Losartan 50mg  daily  Past Medical History:  Diagnosis Date  . Acute posthemorrhagic anemia 12/23/2011  . Anemia 03/06/2015  . BPH (benign prostatic hyperplasia)   . Cardiomegaly 12/24/2011  . Degeneration of thoracic or thoracolumbar intervertebral disc 12/24/2011  . Edema 04/28/2012  . Fracture, intertrochanteric, left femur ([place]) 12/17/2011  . Hypertension   . Other abnormal blood chemistry 12/24/2011  . Senile osteoporosis 08/25/2012  . Spinal stenosis, lumbar region, without neurogenic claudication 12/24/2011  . Thyroid disease   . Unspecified hearing loss 12/24/2011  . Unspecified hypothyroidism 12/23/2011    Past Surgical History:  Procedure Laterality Date  . COMPRESSION HIP SCREW  12/17/2011   Procedure: COMPRESSION HIP;  Surgeon: Johnny Bridge, MD;  Location: [facility];  Service: Orthopedics;  Laterality: Left;  . FOOT SURGERY      Allergies  Allergen Reactions  . Clindamycin/Lincomycin     Developed C. dificile  . Horse-Derived Products     Horse serum   . Mold Extract [Trichophyton]     Allergies as of 07/15/2017      Reactions   Clindamycin/lincomycin    Developed C. dificile   Horse-derived Products    Horse serum   Mold Extract [trichophyton]       Medication List        Accurate as of 07/15/17  4:51 PM. Always use your most recent med list.          aspirin 81 MG tablet Take 81 mg by mouth daily.   Clobetasol Prop Emollient Base 0.05 % emollient cream Apply topically 2 (two) times daily.   feeding supplement (ENSURE ENLIVE) Liqd Take 237 mLs by mouth 2 (two) times daily between meals.   finasteride 5 MG tablet Commonly known as:  PROSCAR Take 5 mg by mouth daily.   furosemide 20 MG tablet Commonly known as:  LASIX TAKE 0.5 TABLETS (10 MG TOTAL) BY MOUTH DAILY.   ibuprofen 200 MG tablet Commonly known as:  ADVIL Take one tablet at breakfast and supper   levothyroxine 125 MCG tablet Commonly known as:  SYNTHROID, LEVOTHROID Take 1 tablet (125 mcg total) by mouth daily.   losartan 50 MG tablet Commonly known as:  COZAAR TAKE 1/2 TABLET DAILY TO   STRENGTHEN HEART AND       CONTROL BLOOD PRESSURE   multivitamin tablet Take 1 tablet by mouth daily.   mupirocin ointment 2 % Commonly known as:  BACTROBAN APPLY TO LEFT KNEE WOUND DAILY   nystatin-triamcinolone cream Commonly known  as:  MYCOLOG II APPLY TO LOWER LEFT EXTREMITY TWICE A DAY   PROBIOTIC ACIDOPHILUS PO Take by mouth daily.   Resveratrol 250 MG Caps Take 250 mg by mouth daily.   tamsulosin 0.4 MG Caps capsule Commonly known as:  FLOMAX Take 0.4 mg by mouth daily.   VITAMIN B COMPLEX PO Take 1 tablet by mouth daily.   vitamin C 500 MG tablet Commonly known as:  ASCORBIC ACID Take 500 mg by mouth daily.   Vitamin D 2000 units tablet Take 2,000 Units by mouth daily.   vitamin E 400 UNIT capsule Generic drug:  vitamin E Take 400 Units by mouth daily.       Review of Systems:  Review of Systems  Constitutional: Negative for  activity change, appetite change, chills, diaphoresis, fatigue and fever.  HENT: Positive for hearing loss. Negative for congestion, trouble swallowing and voice change.   Eyes: Negative for visual disturbance.  Respiratory: Negative for cough, choking, chest tightness, shortness of breath and wheezing.   Cardiovascular: Positive for leg swelling. Negative for chest pain and palpitations.  Gastrointestinal: Negative for abdominal distention, abdominal pain, constipation, diarrhea, nausea and vomiting.  Endocrine: Negative for cold intolerance.  Genitourinary: Negative for difficulty urinating, dysuria, frequency and urgency.  Musculoskeletal: Positive for gait problem.  Skin: Negative for color change, pallor, rash and wound.       Mild redness in BLE  Neurological: Negative for tremors, speech difficulty, weakness and headaches.  Psychiatric/Behavioral: Negative for agitation, behavioral problems, confusion and sleep disturbance. The patient is not nervous/anxious.     Health Maintenance  Topic Date Due  . TETANUS/TDAP  04/06/1937  . PNA vac Low Risk Adult (2 of 2 - PCV13) 05/17/2012  . DEXA SCAN  05/15/2018  . INFLUENZA VACCINE  Completed    Physical Exam: Vitals:   07/15/17 1555  BP: 120/70  Pulse: 68  Resp: 20  Temp: 98.6 F (37 C)  SpO2: 94%  Weight: 143 lb 9.6 oz (65.1 kg)  Height: 5\' 5"  (1.651 m)   Body mass index is 23.9 kg/m. Physical Exam  Constitutional: He appears well-developed and well-nourished. No distress.  HENT:  Head: Normocephalic and atraumatic.  Eyes: Conjunctivae and EOM are normal. Pupils are equal, round, and reactive to light.  Neck: Normal range of motion. Neck supple. No JVD present. No thyromegaly present.  Cardiovascular: Normal rate, regular rhythm and normal heart sounds.  No murmur heard. Pulmonary/Chest: Effort normal and breath sounds normal. He has no wheezes. He has no rales.  Abdominal: Soft. Bowel sounds are normal. He exhibits no  distension. There is no tenderness.  Musculoskeletal: Normal range of motion. He exhibits edema. He exhibits no tenderness.  BLE edema 2+  Neurological: He exhibits abnormal muscle tone. Coordination normal.  Skin: Skin is warm and dry. No rash noted. He is not diaphoretic. There is erythema. No pallor.  Scaly dry redness and mild warmth BLE 1/2 above the ankles.   Psychiatric: He has a normal mood and affect. His behavior is normal. Judgment and thought content normal.    Labs reviewed: Basic Metabolic Panel: Recent Labs    10/19/16 0001 01/13/17 0801 04/15/17 0745  NA 136  --   --   K 4.3  --   --   CL 105  --   --   CO2 25  --   --   GLUCOSE 96  --   --   BUN 26*  --   --  CREATININE 1.01  --   --   CALCIUM 9.4  --   --   TSH 0.16* 4.86* 0.87   Liver Function Tests: Recent Labs    10/19/16 0001  AST 20  ALT 9  ALKPHOS 71  BILITOT 0.5  PROT 6.0*  ALBUMIN 3.7   No results for input(s): LIPASE, AMYLASE in the last 8760 hours. No results for input(s): AMMONIA in the last 8760 hours. CBC: Recent Labs    10/19/16 0001  WBC 8.1  HGB 11.2*  HCT 33.4*  MCV 101.2*  PLT 246   Lipid Panel: No results for input(s): CHOL, HDL, LDLCALC, TRIG, CHOLHDL, LDLDIRECT in the last 8760 hours. No results found for: HGBA1C  Procedures since last visit: No results found.  Assessment/Plan Edema  increased leg swelling, increase Furosemide /2010mg  daily, adding Kcl 58meq po daily. , the patient denied cough, SOB, sputum production, chest pain/pressure/palpitation, or paroxysmal nocturnal orthopnea. F/u BMP one week. Encourage compression hosiery daily. Monitor weight at home.   HTN (hypertension)  History of HTN, blood pressure is controlled, continue Losartan 50mg  daily     Labs/tests ordered: BMP one week  Next appt:  08/05/2017  Time spend 25 minutes.

## 2017-07-15 NOTE — Assessment & Plan Note (Signed)
History of HTN, blood pressure is controlled, continue Losartan 50mg  daily

## 2017-07-23 ENCOUNTER — Ambulatory Visit (INDEPENDENT_AMBULATORY_CARE_PROVIDER_SITE_OTHER): Payer: Medicare Other | Admitting: Neurology

## 2017-07-23 ENCOUNTER — Other Ambulatory Visit (INDEPENDENT_AMBULATORY_CARE_PROVIDER_SITE_OTHER): Payer: Medicare Other

## 2017-07-23 ENCOUNTER — Encounter: Payer: Self-pay | Admitting: Neurology

## 2017-07-23 VITALS — BP 144/80 | HR 77 | Ht 65.0 in | Wt 133.2 lb

## 2017-07-23 DIAGNOSIS — M48061 Spinal stenosis, lumbar region without neurogenic claudication: Secondary | ICD-10-CM | POA: Diagnosis not present

## 2017-07-23 DIAGNOSIS — R269 Unspecified abnormalities of gait and mobility: Secondary | ICD-10-CM

## 2017-07-23 DIAGNOSIS — R270 Ataxia, unspecified: Secondary | ICD-10-CM

## 2017-07-23 LAB — VITAMIN B12: Vitamin B-12: >1500 pg/mL — ABNORMAL HIGH (ref 211–911)

## 2017-07-23 NOTE — Patient Instructions (Signed)
1.  Check vitamin B12 2.  Please let me know if you would like to start physical therapy for balance training  Return to clinic as needed

## 2017-07-23 NOTE — Progress Notes (Signed)
[facility] Clinic Note - Initial Visit   Date: 07/23/17  Cory Burton MRN: [id number] DOB: 09-22-1917   Dear Dr. Marda Stalker:  Thank you for your kind referral of Cory Burton for consultation of gait abnormality. Although his history is well known to you, please allow [place] to reiterate it for the purpose of our medical record. The patient was accompanied to the clinic by self.   History of Present Illness: Cory Burton is a 83 y.o. Caucasian male with hypothyroidism, lumbar spinal stenosis hypertension, and BPH presenting for evaluation of gait imbalance.   He is in remarkably great health for his advanced age.  He lives at [facility].  He began having difficulty with gait, low back pain, and radicular pain in the right leg in 2002.  MRI lumbar spine 2002 which showed multilevel degenerative changes with spinal stenosis and foraminal stenosis.  He received ESI which helped briefly with back and radicular right leg pain.  He tried physical therapy and has home exercises which he is compliant with.  Over the past several years, he has very low grade dull low back pain and has right leg radicular pain about once per month.  He denies any numbness/tingling or weakness.  He currently walks with a cane and walker, as needed, for the past 4-5 years.  He has not suffered any falls.    He and wife moved into [facility] home in 1998. He lost his wife in 2016 after being married an impressive 49 years.  He is a retired [employer].  Out-side paper records, electronic medical record, and images have been reviewed where available and summarized as:  Lab Results  Component Value Date   TSH 0.87 04/15/2017   Lab Results  Component Value Date   TSH 0.87 04/15/2017   CT head wo contrast 06/15/2016: 1. Left frontal forehead and upper periorbital scalp and facial hematoma. No intraorbital abnormality or underlying bony abnormality. 2. No acute intracranial  findings. 3. Calcifications along the dentate nuclei of the cerebellum, the lentiform nucleus of the basal ganglia, and faint linear calcifications in the periventricular white matter and corona radiata. Although most likely benign physiologic calcifications, other entities the could be associated with calcifications in a similar pattern include Fahr disease, toxic processes such as carbon monoxide poisoning; parathyroid disorders ; and certain other inherited and infectious conditions.  MRI lumbar spine wo contrast 02/19/2001: 1.    LIMITED EXAM SECONDARY TO PROMINENT PATIENT MOTION DEGRADATION. 2.    L4-5 MULTIFACTORIAL MODERATE SPINAL STENOSIS WITH MODERATE RIGHT-SIDED AND MILD TO MODERATE LEFT-SIDED NEURAL FORAMINAL NARROWING.  THIS IS CAUSING ENCROACHMENT UPON THE COURSE OF THE EXITING RIGHT L4 NERVE ROOT.  ADDITIONALLY, THERE MAY BE A SMALL CEPHALAD EXTENDING EXTRUDED DISK IN A RIGHT POSTEROLATERAL POSITION. 3.    L5-S1 MULTIFACTORIAL MILD TO SLIGHTLY MODERATE SPINAL STENOSIS, BILATERAL SUBARTICULAR LATERAL RECESS STENOSIS, AND MODERATE BILATERAL NEURAL FORAMINAL NARROWING.  4.    L3-4 MULTIFACTORIAL MILD SPINAL STENOSIS AND BILATERAL NEURAL FORAMINAL NARROWING. PLEASE SEE ABOVE FOR FURTHER DETAIL.  Past Medical History:  Diagnosis Date  . Acute posthemorrhagic anemia 12/23/2011  . Anemia 03/06/2015  . BPH (benign prostatic hyperplasia)   . Cardiomegaly 12/24/2011  . Degeneration of thoracic or thoracolumbar intervertebral disc 12/24/2011  . Edema 04/28/2012  . Fracture, intertrochanteric, left femur ([place]) 12/17/2011  . Hypertension   . Other abnormal blood chemistry 12/24/2011  . Senile osteoporosis 08/25/2012  . Spinal stenosis, lumbar region, without neurogenic claudication 12/24/2011  . Thyroid  disease   . Unspecified hearing loss 12/24/2011  . Unspecified hypothyroidism 12/23/2011    Past Surgical History:  Procedure Laterality Date  . COMPRESSION HIP SCREW  12/17/2011   Procedure: COMPRESSION  HIP;  Surgeon: Johnny Bridge, MD;  Location: [facility];  Service: Orthopedics;  Laterality: Left;  . FOOT SURGERY       Medications:  Outpatient Encounter Medications as of 07/23/2017  Medication Sig  . aspirin 81 MG tablet Take 81 mg by mouth daily.  . B Complex Vitamins (VITAMIN B COMPLEX PO) Take 1 tablet by mouth daily.   . Cholecalciferol (VITAMIN D) 2000 UNITS tablet Take 2,000 Units by mouth daily.  . Clobetasol Prop Emollient Base 0.05 % emollient cream Apply topically 2 (two) times daily.  . feeding supplement, ENSURE ENLIVE, (ENSURE ENLIVE) LIQD Take 237 mLs by mouth 2 (two) times daily between meals.  . finasteride (PROSCAR) 5 MG tablet Take 5 mg by mouth daily.  . furosemide (LASIX) 20 MG tablet Take 1 tablet (20 mg total) by mouth daily.  [name] ibuprofen (ADVIL) 200 MG tablet Take one tablet at breakfast and supper  . Lactobacillus (PROBIOTIC ACIDOPHILUS PO) Take by mouth daily.  [name] levothyroxine (SYNTHROID, LEVOTHROID) 125 MCG tablet Take 1 tablet (125 mcg total) by mouth daily. (Patient taking differently: Take by mouth daily before breakfast. Take 1 tablet Mon-Sat and 2 tablet on Sun.)  . losartan (COZAAR) 50 MG tablet TAKE 1/2 TABLET DAILY TO   STRENGTHEN HEART AND       CONTROL BLOOD PRESSURE  . Multiple Vitamin (MULTIVITAMIN) tablet Take 1 tablet by mouth daily.  . mupirocin ointment (BACTROBAN) 2 % APPLY TO LEFT KNEE WOUND DAILY  . nystatin-triamcinolone (MYCOLOG II) cream APPLY TO LOWER LEFT EXTREMITY TWICE A DAY  . potassium chloride SA (K-DUR,KLOR-CON) 20 MEQ tablet Take 1 tablet (20 mEq total) by mouth daily.  [name] Resveratrol 250 MG CAPS Take 250 mg by mouth daily.  . Tamsulosin HCl (FLOMAX) 0.4 MG CAPS Take 0.4 mg by mouth daily.  . vitamin C (ASCORBIC ACID) 500 MG tablet Take 500 mg by mouth daily.  . vitamin E (VITAMIN E) 400 UNIT capsule Take 400 Units by mouth daily.   No facility-administered encounter medications on file as of 07/23/2017.      Allergies:    Allergies  Allergen Reactions  . Clindamycin/Lincomycin     Developed C. dificile  . Horse-Derived Products     Horse serum  . Mold Extract [Trichophyton]     Family History: Family History  Problem Relation Age of Onset  . Cancer Mother   . Other Father        accident    Social History: Social History   Tobacco Use  . Smoking status: Former Smoker    Types: Pipe    Last attempt to quit: 04/27/1985    Years since quitting: 32.2  . Smokeless tobacco: Never Used  Substance Use Topics  . Alcohol use: Yes    Alcohol/week: 4.2 oz    Types: 7 Glasses of wine per week    Comment: 1-2 glasses a day  . Drug use: No   Social History   Social History Narrative   Lives at [place]      DIET: none       DO YOU DRINK/EAT THINGS WITH CAFFEINE: coffee and tea      MARITAL STATUS: widow since 2016      WHAT YEAR WERE YOU MARRIED:  DO YOU LIVE IN A HOUSE, APARTMENT, ASSISTED LIVING, CONDO TRAILER ETC.:      IS IT ONE OR MORE STORIES:      HOW MANY PERSONS LIVE IN YOUR HOME:      DO YOU HAVE PETS IN YOUR HOME:      CURRENT OR PAST PROFESSION:      DO YOU EXERCISE: yes , WHAT TYPE AND HOW OFTEN: stand behind chair and work legs              Review of Systems:  CONSTITUTIONAL: No fevers, chills, night sweats, or weight loss.   EYES: No visual changes or eye pain ENT: No hearing changes.  No history of nose bleeds.   RESPIRATORY: No cough, wheezing and shortness of breath.   CARDIOVASCULAR: Negative for chest pain, and palpitations.   GI: Negative for abdominal discomfort, blood in stools or black stools.  No recent change in bowel habits.   GU:  No history of incontinence.   MUSCLOSKELETAL: +history of joint pain or swelling.  No myalgias.   SKIN: Negative for lesions, rash, and itching.   HEMATOLOGY/ONCOLOGY: Negative for prolonged bleeding, bruising easily, and swollen nodes.  No history of cancer.   ENDOCRINE: Negative for cold or heat  intolerance, polydipsia or goiter.   PSYCH:  No depression or anxiety symptoms.   NEURO: As Above.   Vital Signs:  BP (!) 144/80   Pulse 77   Ht 5\' 5"  (1.651 m)   Wt 133 lb 4 oz (60.4 kg)   SpO2 100%   BMI 22.17 kg/m    General Medical Exam:   General:  Well appearing, comfortable.   Eyes/ENT: see cranial nerve examination.   Neck: No masses appreciated.  Full range of motion without tenderness.  No carotid bruits. Respiratory:  Clear to auscultation, good air entry bilaterally.   Cardiac:  Regular rate and rhythm, no murmur.   Extremities:  No deformities, edema, or skin discoloration.  Skin:  No rashes or lesions.  Neurological Exam: MENTAL STATUS including orientation to time, place, person, recent and remote memory, attention span and concentration, language, and fund of knowledge is normal.  Speech is not dysarthric.  CRANIAL NERVES: II:  No visual field defects.  Unremarkable fundi.   III-IV-VI: Pupils equal round and reactive to light.  Normal conjugate, extra-ocular eye movements in all directions of gaze.  No nystagmus.  Left ptosis (old).   V:  Normal facial sensation.   VII:  Normal facial symmetry and movements.   VIII:  Normal hearing and vestibular function.   IX-X:  Normal palatal movement.   XI:  Normal shoulder shrug and head rotation.   XII:  Normal tongue strength and range of motion, no deviation or fasciculation.  MOTOR:  Mild loss of muscle bulk in the intrinsic hand muscles.  No atrophy, fasciculations or abnormal movements.  No pronator drift.  Tone is normal.    Right Upper Extremity:    Left Upper Extremity:    Deltoid  5/5   Deltoid  5/5   Biceps  5/5   Biceps  5/5   Triceps  5/5   Triceps  5/5   Wrist extensors  5/5   Wrist extensors  5/5   Wrist flexors  5/5   Wrist flexors  5/5   Finger extensors  5/5   Finger extensors  5/5   Finger flexors  5/5   Finger flexors  5/5   Dorsal interossei  5/5   Dorsal  interossei  5/5   Abductor pollicis   5/5   Abductor pollicis  5/5   Tone (Ashworth scale)  0  Tone (Ashworth scale)  0   Right Lower Extremity:    Left Lower Extremity:    Hip flexors  5/5   Hip flexors  5/5   Hip extensors  5/5   Hip extensors  5/5   Knee flexors  5/5   Knee flexors  5/5   Knee extensors  5/5   Knee extensors  5/5   Dorsiflexors  5/5   Dorsiflexors  5-/5   Plantarflexors  5/5   Plantarflexors  5/5   Toe extensors  5/5   Toe extensors  4/5   Toe flexors  5/5   Toe flexors  5/5   Tone (Ashworth scale)  0  Tone (Ashworth scale)  0   MSRs:  Right                                                                 Left brachioradialis 2+  brachioradialis 2+  biceps 2+  biceps 2+  triceps 2+  triceps 2+  patellar 3+  patellar 3+  ankle jerk 2+  ankle jerk 2+  Hoffman no  Hoffman no  plantar response down  plantar response down   SENSORY:  Absent vibration and pin prick distal to ankles bilaterally.  Rhomberg sign is negative.  COORDINATION/GAIT: Normal finger-to- nose-finger and heel-to-shin.  Intact rapid alternating movements bilaterally.  Able to rise from a chair without using arms.  Gait mildly-wide based.    IMPRESSION: Mr. Fitterer is a delightful and high functioning 83 year old man referred for evaluation of gait difficulty.  He has an impressive neurological exam for his age with mild sensory loss in the feet, brisk patella jerks, and mild weakness in the left L5 myotome.  He has known lumbar spinal stenosis, which may be getting worse, but he denies any severe low back pain, weakness, or painful paresthesias. Repeat imaging of the lumbar spine is not indicated, as it would not change management. Instead, I recommend formal physical therapy for gait and balance training, but he prefers to continue to do his back strengthening and balance exercises.  He has mild ataxia which may be stemming from age-related neuropathy (mild), but to be complete, vitamin B12 and MMA will be checked.  Fall precautions discussed  and he was encouraged to be compliant with his walker and cane, which he is.  It was a pleasure seeing Mr. Clute today and will be happy to see him again in the future, if symptoms get worse.  Greater than 50% of this 45 minute visit was spent in counseling, explanation of diagnosis, planning of further management, and coordination of care.  Thank you for allowing me to participate in patient's care.  If I can answer any additional questions, I would be pleased to do so.    Sincerely,    Donika K. Posey Pronto, DO

## 2017-07-28 LAB — METHYLMALONIC ACID, SERUM: Methylmalonic Acid, Quant: 178 nmol/L (ref 87–318)

## 2017-07-29 DIAGNOSIS — I10 Essential (primary) hypertension: Secondary | ICD-10-CM | POA: Diagnosis not present

## 2017-07-29 DIAGNOSIS — R609 Edema, unspecified: Secondary | ICD-10-CM | POA: Diagnosis not present

## 2017-07-29 LAB — BASIC METABOLIC PANEL WITH GFR
BUN: 24 mg/dL (ref 7–25)
CO2: 29 mmol/L (ref 20–32)
Calcium: 9.7 mg/dL (ref 8.6–10.3)
Chloride: 104 mmol/L (ref 98–110)
Creat: 1.04 mg/dL (ref 0.70–1.11)
GFR, Est African American: 69 mL/min/{1.73_m2} (ref >=60)
GFR, Est Non African American: 60 mL/min/{1.73_m2} (ref >=60)
Glucose, Bld: 98 mg/dL (ref 65–99)
Potassium: 4.4 mmol/L (ref 3.5–5.3)
Sodium: 137 mmol/L (ref 135–146)

## 2017-08-04 ENCOUNTER — Telehealth: Payer: Self-pay | Admitting: Internal Medicine

## 2017-08-04 NOTE — Telephone Encounter (Signed)
Left msg asking pt if he can be at [facility] clinic at 12:45 instead of 1:30 for AWV-S w/ nurse before seeing Manxie. VDM (DD)

## 2017-08-05 ENCOUNTER — Encounter: Payer: Self-pay | Admitting: Nurse Practitioner

## 2017-08-05 ENCOUNTER — Non-Acute Institutional Stay: Payer: Medicare Other | Admitting: Nurse Practitioner

## 2017-08-05 ENCOUNTER — Non-Acute Institutional Stay: Payer: Medicare Other

## 2017-08-05 VITALS — BP 128/62 | HR 95 | Temp 97.5°F | Ht 65.0 in | Wt 134.0 lb

## 2017-08-05 DIAGNOSIS — R609 Edema, unspecified: Secondary | ICD-10-CM | POA: Diagnosis not present

## 2017-08-05 DIAGNOSIS — I10 Essential (primary) hypertension: Secondary | ICD-10-CM | POA: Diagnosis not present

## 2017-08-05 DIAGNOSIS — N400 Enlarged prostate without lower urinary tract symptoms: Secondary | ICD-10-CM

## 2017-08-05 DIAGNOSIS — Z0000 Encounter for general adult medical examination without abnormal findings: Secondary | ICD-10-CM | POA: Diagnosis not present

## 2017-08-05 DIAGNOSIS — E039 Hypothyroidism, unspecified: Secondary | ICD-10-CM

## 2017-08-05 DIAGNOSIS — R269 Unspecified abnormalities of gait and mobility: Secondary | ICD-10-CM | POA: Diagnosis not present

## 2017-08-05 NOTE — Assessment & Plan Note (Addendum)
His blood pressure is controlled, continue Losratan 50mg  daily. Update lipid panel.

## 2017-08-05 NOTE — Patient Instructions (Signed)
Vit B12, Methylmalonic acid level, CBC, TSH after new year. Next appt:  4 months

## 2017-08-05 NOTE — Assessment & Plan Note (Signed)
07/23/17 Neurology, PT, Vit B12, Methylmalonic acid level check

## 2017-08-05 NOTE — Assessment & Plan Note (Signed)
Chronic leg edema is stable, trace, more in the right, continue Furosemide 20mg  daily.

## 2017-08-05 NOTE — Progress Notes (Signed)
Location:   [facility]   Place of Service:  Clinic (12) Provider: Marlana Latus NP  Code Status: DNR Goals of Care:  Advanced Directives 08/05/2017  Does Patient Have a Medical Advance Directive? Yes  Type of Paramedic of [place];Living will  Does patient want to make changes to medical advance directive? No - Patient declined  Copy of [place] in Chart? Yes  Would patient like information on creating a medical advance directive? -  Pre-existing out of facility DNR order (yellow form or pink MOST form) -     Chief Complaint  Patient presents with  . Medical Management of Chronic Issues    1 yr f/u,    HPI: Patient is a 83 y.o. male seen today for evaluation  visit for gait instability, no recent falls, under went neurology evaluation 07/23/17, recommended PT, Vit B12 and Methylmalonic acid level check. Chronic leg edema is stable, trace, more in the right, taking Furosemide 20mg  daily. His blood pressure is controlled on Losratan 50mg  daily. Hypothyroidism, taking Levothyroxine 110mcg, last TSH. BPH controlled on Finasteride 5mg  daily and Tamsullosin 0.4mg  qd.   Past Medical History:  Diagnosis Date  . Acute posthemorrhagic anemia 12/23/2011  . Anemia 03/06/2015  . BPH (benign prostatic hyperplasia)   . Cardiomegaly 12/24/2011  . Degeneration of thoracic or thoracolumbar intervertebral disc 12/24/2011  . Edema 04/28/2012  . Fracture, intertrochanteric, left femur ([place]) 12/17/2011  . Hypertension   . Other abnormal blood chemistry 12/24/2011  . Senile osteoporosis 08/25/2012  . Spinal stenosis, lumbar region, without neurogenic claudication 12/24/2011  . Thyroid disease   . Unspecified hearing loss 12/24/2011  . Unspecified hypothyroidism 12/23/2011    Past Surgical History:  Procedure Laterality Date  . COMPRESSION HIP SCREW  12/17/2011   Procedure: COMPRESSION HIP;  Surgeon: Johnny Bridge, MD;  Location: [facility];  Service: Orthopedics;  Laterality:  Left;  . FOOT SURGERY      Allergies  Allergen Reactions  . Clindamycin/Lincomycin     Developed C. dificile  . Horse-Derived Products     Horse serum  . Mold Extract [Trichophyton]     Allergies as of 08/05/2017      Reactions   Clindamycin/lincomycin    Developed C. dificile   Horse-derived Products    Horse serum   Mold Extract [trichophyton]       Medication List        Accurate as of 08/05/17 11:59 PM. Always use your most recent med list.          aspirin 81 MG tablet Take 81 mg by mouth daily.   Clobetasol Prop Emollient Base 0.05 % emollient cream Apply topically 2 (two) times daily.   feeding supplement (ENSURE ENLIVE) Liqd Take 237 mLs by mouth 2 (two) times daily between meals.   finasteride 5 MG tablet Commonly known as:  PROSCAR Take 5 mg by mouth daily.   furosemide 20 MG tablet Commonly known as:  LASIX Take 1 tablet (20 mg total) by mouth daily.   ibuprofen 200 MG tablet Commonly known as:  ADVIL Take one tablet at breakfast and supper   levothyroxine 125 MCG tablet Commonly known as:  SYNTHROID, LEVOTHROID Take 1 tablet (125 mcg total) by mouth daily.   losartan 50 MG tablet Commonly known as:  COZAAR TAKE 1/2 TABLET DAILY TO   STRENGTHEN HEART AND       CONTROL BLOOD PRESSURE   multivitamin tablet Take 1 tablet by mouth daily.  mupirocin ointment 2 % Commonly known as:  BACTROBAN APPLY TO LEFT KNEE WOUND DAILY   nystatin-triamcinolone cream Commonly known as:  MYCOLOG II APPLY TO LOWER LEFT EXTREMITY TWICE A DAY   potassium chloride SA 20 MEQ tablet Commonly known as:  K-DUR,KLOR-CON Take 1 tablet (20 mEq total) by mouth daily.   PROBIOTIC ACIDOPHILUS PO Take by mouth daily.   Resveratrol 250 MG Caps Take 250 mg by mouth daily.   tamsulosin 0.4 MG Caps capsule Commonly known as:  FLOMAX Take 0.4 mg by mouth daily.   VITAMIN B COMPLEX PO Take 1 tablet by mouth daily.   vitamin C 500 MG tablet Commonly known as:   ASCORBIC ACID Take 500 mg by mouth daily.   Vitamin D 2000 units tablet Take 2,000 Units by mouth daily.   vitamin E 400 UNIT capsule Generic drug:  vitamin E Take 400 Units by mouth daily.       Review of Systems:  Review of Systems  Constitutional: Negative for activity change, appetite change, chills, diaphoresis and fatigue.  HENT: Negative for congestion, trouble swallowing and voice change.   Eyes: Negative for visual disturbance.       Corrective glasses  Respiratory: Negative for cough, choking, chest tightness, shortness of breath and wheezing.   Cardiovascular: Positive for leg swelling.  Gastrointestinal: Negative for abdominal distention, abdominal pain, constipation, diarrhea, nausea and vomiting.  Endocrine: Negative for cold intolerance.  Genitourinary: Negative for difficulty urinating, dysuria and frequency.       0-1x urination per night.   Musculoskeletal: Positive for arthralgias and gait problem.  Skin: Negative for color change, pallor, rash and wound.  Neurological: Positive for numbness. Negative for tremors, speech difficulty, weakness and headaches.       Occasionally right arm numbness  Psychiatric/Behavioral: Negative for agitation, behavioral problems, confusion, hallucinations and sleep disturbance. The patient is not nervous/anxious.     Health Maintenance  Topic Date Due  . TETANUS/TDAP  04/06/1937  . PNA vac Low Risk Adult (2 of 2 - PCV13) 05/17/2012  . DEXA SCAN  05/15/2018  . INFLUENZA VACCINE  Completed    Physical Exam: Vitals:   08/05/17 1327  BP: 128/62  Pulse: 95  Temp: (!) 97.5 F (36.4 C)  SpO2: 96%  Weight: 134 lb (60.8 kg)  Height: 5\' 5"  (1.651 m)   Body mass index is 22.3 kg/m. Physical Exam  Constitutional: He is oriented to person, place, and time. He appears well-developed.  HENT:  Head: Normocephalic and atraumatic.  Eyes: Conjunctivae and EOM are normal. Pupils are equal, round, and reactive to light.  Neck:  Normal range of motion. Neck supple. No JVD present. No thyromegaly present.  Cardiovascular: Normal rate, regular rhythm and normal heart sounds.  No murmur heard. Pulmonary/Chest: Effort normal and breath sounds normal. He has no wheezes. He has no rales.  Abdominal: Soft. Bowel sounds are normal. He exhibits no distension. There is no tenderness.  Musculoskeletal: Normal range of motion. He exhibits edema. He exhibits no tenderness.  Trace, R>L  Neurological: He is alert and oriented to person, place, and time. He exhibits normal muscle tone. Coordination normal.  Skin: Skin is warm and dry. No erythema.  Psychiatric: He has a normal mood and affect. His behavior is normal. Judgment and thought content normal.    Labs reviewed: Basic Metabolic Panel: Recent Labs    10/19/16 0001 01/13/17 0801 04/15/17 0745 07/29/17 0735  NA 136  --   --  137  K 4.3  --   --  4.4  CL 105  --   --  104  CO2 25  --   --  29  GLUCOSE 96  --   --  98  BUN 26*  --   --  24  CREATININE 1.01  --   --  1.04  CALCIUM 9.4  --   --  9.7  TSH 0.16* 4.86* 0.87  --    Liver Function Tests: Recent Labs    10/19/16 0001  AST 20  ALT 9  ALKPHOS 71  BILITOT 0.5  PROT 6.0*  ALBUMIN 3.7   No results for input(s): LIPASE, AMYLASE in the last 8760 hours. No results for input(s): AMMONIA in the last 8760 hours. CBC: Recent Labs    10/19/16 0001  WBC 8.1  HGB 11.2*  HCT 33.4*  MCV 101.2*  PLT 246   Lipid Panel: No results for input(s): CHOL, HDL, LDLCALC, TRIG, CHOLHDL, LDLDIRECT in the last 8760 hours. No results found for: HGBA1C  Procedures since last visit: No results found.  Assessment/Plan Abnormality of gait 07/23/17 Neurology, PT, Vit B12, Methylmalonic acid level check  Edema Chronic leg edema is stable, trace, more in the right, continue Furosemide 20mg  daily.   HTN (hypertension) His blood pressure is controlled, continue Losratan 50mg  daily. Update lipid panel.    Hypothyroidism Hypothyroidism, continue Levothyroxine 1106mcg, last TSH 0.87 04/15/17. Update TSH   BPH (benign prostatic hyperplasia) BPH controlled on Finasteride 5mg  daily and Tamsullosin 0.4mg  qd. Night urination 0-1 per night.      Labs/tests ordered: Vit B12, Methylmalonic acid level, CBC, TSH, lipid panel.   Next appt:  4 months   time spend 25 minutes

## 2017-08-05 NOTE — Progress Notes (Signed)
Subjective:   Cory Burton is a 83 y.o. male who presents for Medicare Annual/Subsequent preventive examination at [facility]       Objective:    Vitals: BP 128/62 (BP Location: Left Arm, Patient Position: Sitting)   Pulse 95   Temp (!) 97.5 F (36.4 C) (Oral)   Ht 5\' 5"  (1.651 m)   Wt 134 lb (60.8 kg)   SpO2 96%   BMI 22.30 kg/m   Body mass index is 22.3 kg/m.  Advanced Directives 08/05/2017 07/15/2017 04/15/2017 02/04/2017 12/02/2016 10/22/2016 09/14/2016  Does Patient Have a Medical Advance Directive? Yes Yes Yes Yes Yes Yes Yes  Type of Paramedic of [place];Living will [place];Living will [place];Living will [place];Living will [place];Living will [place];Living will [place];Living will  Does patient want to make changes to medical advance directive? No - Patient declined No - Patient declined No - Patient declined No - Patient declined - No - Patient declined -  Copy of [place] in Chart? Yes Yes Yes Yes Yes Yes Yes  Would patient like information on creating a medical advance directive? - - - - - - -  Pre-existing out of facility DNR order (yellow form or pink MOST form) - - - - - - -    Tobacco Social History   Tobacco Use  Smoking Status Former Smoker  . Types: Pipe  . Last attempt to quit: 04/27/1985  . Years since quitting: 32.2  Smokeless Tobacco Never Used     Counseling given: Not Answered   Clinical Intake:  Pre-visit preparation completed: No  Pain : No/denies pain     Nutritional Risks: None Diabetes: No  How often do you need to have someone help you when you read instructions, pamphlets, or other written materials from your doctor or pharmacy?: 1 - Never What is the last grade level you completed in school?: Bachelors  Interpreter Needed?:  No  Information entered by :: Tyson Dense, RN  Past Medical History:  Diagnosis Date  . Acute posthemorrhagic anemia 12/23/2011  . Anemia 03/06/2015  . BPH (benign prostatic hyperplasia)   . Cardiomegaly 12/24/2011  . Degeneration of thoracic or thoracolumbar intervertebral disc 12/24/2011  . Edema 04/28/2012  . Fracture, intertrochanteric, left femur ([place]) 12/17/2011  . Hypertension   . Other abnormal blood chemistry 12/24/2011  . Senile osteoporosis 08/25/2012  . Spinal stenosis, lumbar region, without neurogenic claudication 12/24/2011  . Thyroid disease   . Unspecified hearing loss 12/24/2011  . Unspecified hypothyroidism 12/23/2011   Past Surgical History:  Procedure Laterality Date  . COMPRESSION HIP SCREW  12/17/2011   Procedure: COMPRESSION HIP;  Surgeon: Johnny Bridge, MD;  Location: [facility];  Service: Orthopedics;  Laterality: Left;  . FOOT SURGERY     Family History  Problem Relation Age of Onset  . Cancer Mother   . Other Father        accident   Social History   Socioeconomic History  . Marital status: Widowed    Spouse name: None  . Number of children: 3  . Years of education: 32  . Highest education level: None  Social Needs  . Financial resource strain: Not hard at all  . Food insecurity - worry: Never true  . Food insecurity - inability: Never true  . Transportation needs - medical: No  . Transportation needs -  non-medical: No  Occupational History  . Occupation: retired Chief Financial Officer  Tobacco Use  . Smoking status: Former Smoker    Types: Pipe    Last attempt to quit: 04/27/1985    Years since quitting: 32.2  . Smokeless tobacco: Never Used  Substance and Sexual Activity  . Alcohol use: Yes    Alcohol/week: 4.2 oz    Types: 7 Glasses of wine per week    Comment: 50 oz a week  . Drug use: No  . Sexual activity: No  Other Topics Concern  . None  Social History Narrative   Lives at [place]      DIET: none       DO YOU DRINK/EAT THINGS WITH  CAFFEINE: coffee and tea      MARITAL STATUS: widow since 2016      WHAT YEAR WERE YOU MARRIED:      DO YOU LIVE IN A HOUSE, APARTMENT, ASSISTED LIVING, CONDO TRAILER ETC.:      IS IT ONE OR MORE STORIES:      HOW MANY PERSONS LIVE IN YOUR HOME:      DO YOU HAVE PETS IN YOUR HOME:      CURRENT OR PAST PROFESSION:      DO YOU EXERCISE: yes , WHAT TYPE AND HOW OFTEN: stand behind chair and work legs              Outpatient Encounter Medications as of 08/05/2017  Medication Sig  . aspirin 81 MG tablet Take 81 mg by mouth daily.  . B Complex Vitamins (VITAMIN B COMPLEX PO) Take 1 tablet by mouth daily.   . Cholecalciferol (VITAMIN D) 2000 UNITS tablet Take 2,000 Units by mouth daily.  . Clobetasol Prop Emollient Base 0.05 % emollient cream Apply topically 2 (two) times daily.  . feeding supplement, ENSURE ENLIVE, (ENSURE ENLIVE) LIQD Take 237 mLs by mouth 2 (two) times daily between meals.  . finasteride (PROSCAR) 5 MG tablet Take 5 mg by mouth daily.  . furosemide (LASIX) 20 MG tablet Take 1 tablet (20 mg total) by mouth daily.  [name] ibuprofen (ADVIL) 200 MG tablet Take one tablet at breakfast and supper  . Lactobacillus (PROBIOTIC ACIDOPHILUS PO) Take by mouth daily.  [name] levothyroxine (SYNTHROID, LEVOTHROID) 125 MCG tablet Take 1 tablet (125 mcg total) by mouth daily. (Patient taking differently: Take by mouth daily before breakfast. Take 1 tablet Mon-Sat and 2 tablet on Sun.)  . losartan (COZAAR) 50 MG tablet TAKE 1/2 TABLET DAILY TO   STRENGTHEN HEART AND       CONTROL BLOOD PRESSURE  . Multiple Vitamin (MULTIVITAMIN) tablet Take 1 tablet by mouth daily.  . mupirocin ointment (BACTROBAN) 2 % APPLY TO LEFT KNEE WOUND DAILY  . nystatin-triamcinolone (MYCOLOG II) cream APPLY TO LOWER LEFT EXTREMITY TWICE A DAY  . potassium chloride SA (K-DUR,KLOR-CON) 20 MEQ tablet Take 1 tablet (20 mEq total) by mouth daily.  [name] Resveratrol 250 MG CAPS Take 250 mg by mouth daily.  . Tamsulosin HCl  (FLOMAX) 0.4 MG CAPS Take 0.4 mg by mouth daily.  . vitamin C (ASCORBIC ACID) 500 MG tablet Take 500 mg by mouth daily.  . vitamin E (VITAMIN E) 400 UNIT capsule Take 400 Units by mouth daily.   No facility-administered encounter medications on file as of 08/05/2017.     Activities of Daily Living In your present state of health, do you have any difficulty performing the following activities: 08/05/2017  Hearing? Y  Vision?  N  Difficulty concentrating or making decisions? N  Walking or climbing stairs? N  Dressing or bathing? N  Doing errands, shopping? N  Preparing Food and eating ? N  Using the Toilet? N  In the past six months, have you accidently leaked urine? N  Do you have problems with loss of bowel control? N  Managing your Medications? N  Managing your Finances? N  Housekeeping or managing your Housekeeping? N  Some recent data might be hidden    Patient Care Team: Blanchie Serve, MD as PCP - General (Internal Medicine) [place], Man X, NP as Nurse Practitioner (Nurse Practitioner) Earlie Server, MD as Consulting Physician (Orthopedic Surgery) Jovita Gamma, MD as Consulting Physician (Neurosurgery) Rutherford Guys, MD as Consulting Physician (Ophthalmology) Jettie Booze, MD as Consulting Physician (Cardiology) Marchia Bond, MD as Consulting Physician (Orthopedic Surgery) Hurman Horn, MD as Consulting Physician (Ophthalmology) Verner Chol, MD as Consulting Physician (Sports Medicine)   Assessment:   This is a routine wellness examination for [place].  Exercise Activities and Dietary recommendations Current Exercise Habits: Home exercise routine, Type of exercise: stretching, Time (Minutes): 20, Frequency (Times/Week): 6, Weekly Exercise (Minutes/Week): 120, Intensity: Mild, Exercise limited by: None identified  Goals    . Patient Stated     Starting today pt will maintain lifestyle.        Fall Risk Fall Risk   08/05/2017 07/23/2017 09/14/2016 10/24/2015 06/11/2015  Falls in the past year? No No Yes Yes No  Number falls in past yr: - - 1 1 -  Injury with Fall? - - Yes No -  Comment - - Cut above left eye that required 2 stitches - -   Is the patient's home free of loose throw rugs in walkways, pet beds, electrical cords, etc?   yes      Grab bars in the bathroom? yes      Handrails on the stairs?   yes      Adequate lighting?   yes  Timed Get Up and Go Performed: 16 seconds, fall risk  Depression Screen PHQ 2/9 Scores 08/05/2017 10/24/2015 02/20/2015 10/26/2013  PHQ - 2 Score 0 0 0 0    Cognitive Function MMSE - Mini Mental State Exam 08/05/2017 10/24/2015  Orientation to time 3 5  Orientation to Place 5 5  Registration 3 3  Attention/ Calculation 5 5  Recall 0 0  Language- name 2 objects 2 2  Language- repeat 1 1  Language- follow 3 step command 3 2  Language- read & follow direction 1 1  Write a sentence 1 1  Copy design 1 1  Total score 25 26        Immunization History  Administered Date(s) Administered  . Influenza Whole 05/17/2012, 05/14/2017  . Influenza, High Dose Seasonal PF 05/18/2016  . Influenza-Unspecified 05/17/2013, 05/17/2014, 05/10/2015  . Pneumococcal Polysaccharide-23 05/18/2011  . Zoster Recombinat (Shingrix) 02/21/2017    Qualifies for Shingles Vaccine? Yes, waiting for second shot  Screening Tests Health Maintenance  Topic Date Due  . TETANUS/TDAP  04/06/1937  . PNA vac Low Risk Adult (2 of 2 - PCV13) 05/17/2012  . DEXA SCAN  05/15/2018  . INFLUENZA VACCINE  Completed   Cancer Screenings: Lung: Low Dose CT Chest recommended if Age 7-80 years, 30 pack-year currently smoking OR have quit w/in 15years. Patient does not qualify. Colorectal: up to date  Additional Screenings:  Hepatitis B/HIV/Syphillis: Not indicated Hepatitis C Screening: Not indicated  Plan:    I have personally reviewed and addressed the Medicare Annual Wellness questionnaire and  have noted the following in the patient's chart:  A. Medical and social history B. Use of alcohol, tobacco or illicit drugs  C. Current medications and supplements D. Functional ability and status E.  Nutritional status F.  Physical activity G. Advance directives H. List of other physicians I.  Hospitalizations, surgeries, and ER visits in previous 12 months J.  [place] to include hearing, vision, cognitive, depression L. Referrals and appointments - none  In addition, I have reviewed and discussed with patient certain preventive protocols, quality metrics, and best practice recommendations. A written personalized care plan for preventive services as well as general preventive health recommendations were provided to patient.  See attached scanned questionnaire for additional information.   Signed,   Tyson Dense, RN Nurse Health Advisor   Quick Notes   Health Maintenance: Waiting for second Shingrix shot. Pt declined tdap. Due for pna 13- will wait to send prescription until next time to give time for getting second Shingrix shot     Abnormal Screen: MMSE 25/30. Did not pass clock drawing     Patient Concerns: none     Nurse Concerns: none

## 2017-08-05 NOTE — Patient Instructions (Signed)
Cory Burton , Thank you for taking time to come for your Medicare Wellness Visit. I appreciate your ongoing commitment to your health goals. Please review the following plan we discussed and let me know if I can assist you in the future.   Screening recommendations/referrals: Colonoscopy excluded, you are over age 81 Recommended yearly ophthalmology/optometry visit for glaucoma screening and checkup Recommended yearly dental visit for hygiene and checkup  Vaccinations: Influenza vaccine up to date. Due 2019 fall season Pneumococcal vaccine due, we will send the prescription next time you come after getting the second Shingrix shot Tdap vaccine due, declined for now Shingles vaccine due, waiting for second shot. Call [facility] and see where you are on the waiting list because you only have a month left to be in the recommended window. Feel free to call/go elsewhere to get second shot    Advanced directives: In Chart  Conditions/risks identified: none  Next appointment: [facility] 08/05/2017 @ 1:30pm  Preventive Care 65 Years and Older, Male Preventive care refers to lifestyle choices and visits with your health care provider that can promote health and wellness. What does preventive care include?  A yearly physical exam. This is also called an annual well check.  Dental exams once or twice a year.  Routine eye exams. Ask your health care provider how often you should have your eyes checked.  Personal lifestyle choices, including:  Daily care of your teeth and gums.  Regular physical activity.  Eating a healthy diet.  Avoiding tobacco and drug use.  Limiting alcohol use.  Practicing safe sex.  Taking low doses of aspirin every day.  Taking vitamin and mineral supplements as recommended by your health care provider. What happens during an annual well check? The services and screenings done by your health care provider during your annual well check will depend on your age, overall  health, lifestyle risk factors, and family history of disease. Counseling  Your health care provider may ask you questions about your:  Alcohol use.  Tobacco use.  Drug use.  Emotional well-being.  Home and relationship well-being.  Sexual activity.  Eating habits.  History of falls.  Memory and ability to understand (cognition).  Work and work Statistician. Screening  You may have the following tests or measurements:  Height, weight, and BMI.  Blood pressure.  Lipid and cholesterol levels. These may be checked every 5 years, or more frequently if you are over 39 years old.  Skin check.  Lung cancer screening. You may have this screening every year starting at age 43 if you have a 30-pack-year history of smoking and currently smoke or have quit within the past 15 years.  Fecal occult blood test (FOBT) of the stool. You may have this test every year starting at age 73.  Flexible sigmoidoscopy or colonoscopy. You may have a sigmoidoscopy every 5 years or a colonoscopy every 10 years starting at age 15.  Prostate cancer screening. Recommendations will vary depending on your family history and other risks.  Hepatitis C blood test.  Hepatitis B blood test.  Sexually transmitted disease (STD) testing.  Diabetes screening. This is done by checking your blood sugar (glucose) after you have not eaten for a while (fasting). You may have this done every 1-3 years.  Abdominal aortic aneurysm (AAA) screening. You may need this if you are a current or former smoker.  Osteoporosis. You may be screened starting at age 106 if you are at high risk. Talk with your health care provider  about your test results, treatment options, and if necessary, the need for more tests. Vaccines  Your health care provider may recommend certain vaccines, such as:  Influenza vaccine. This is recommended every year.  Tetanus, diphtheria, and acellular pertussis (Tdap, Td) vaccine. You may need a Td  booster every 10 years.  Zoster vaccine. You may need this after age 88.  Pneumococcal 13-valent conjugate (PCV13) vaccine. One dose is recommended after age 70.  Pneumococcal polysaccharide (PPSV23) vaccine. One dose is recommended after age 38. Talk to your health care provider about which screenings and vaccines you need and how often you need them. This information is not intended to replace advice given to you by your health care provider. Make sure you discuss any questions you have with your health care provider. Document Released: 08/30/2015 Document Revised: 04/22/2016 Document Reviewed: 06/04/2015 Elsevier Interactive Patient Education  2017 [employer] Prevention in the Home Falls can cause injuries. They can happen to people of all ages. There are many things you can do to make your home safe and to help prevent falls. What can I do on the outside of my home?  Regularly fix the edges of walkways and driveways and fix any cracks.  Remove anything that might make you trip as you walk through a door, such as a raised step or threshold.  Trim any bushes or trees on the path to your home.  Use bright outdoor lighting.  Clear any walking paths of anything that might make someone trip, such as rocks or tools.  Regularly check to see if handrails are loose or broken. Make sure that both sides of any steps have handrails.  Any raised decks and porches should have guardrails on the edges.  Have any leaves, snow, or ice cleared regularly.  Use sand or salt on walking paths during winter.  Clean up any spills in your garage right away. This includes oil or grease spills. What can I do in the bathroom?  Use night lights.  Install grab bars by the toilet and in the tub and shower. Do not use towel bars as grab bars.  Use non-skid mats or decals in the tub or shower.  If you need to sit down in the shower, use a plastic, non-slip stool.  Keep the floor dry. Clean up  any water that spills on the floor as soon as it happens.  Remove soap buildup in the tub or shower regularly.  Attach bath mats securely with double-sided non-slip rug tape.  Do not have throw rugs and other things on the floor that can make you trip. What can I do in the bedroom?  Use night lights.  Make sure that you have a light by your bed that is easy to reach.  Do not use any sheets or blankets that are too big for your bed. They should not hang down onto the floor.  Have a firm chair that has side arms. You can use this for support while you get dressed.  Do not have throw rugs and other things on the floor that can make you trip. What can I do in the kitchen?  Clean up any spills right away.  Avoid walking on wet floors.  Keep items that you use a lot in easy-to-reach places.  If you need to reach something above you, use a strong step stool that has a grab bar.  Keep electrical cords out of the way.  Do not use floor polish or  wax that makes floors slippery. If you must use wax, use non-skid floor wax.  Do not have throw rugs and other things on the floor that can make you trip. What can I do with my stairs?  Do not leave any items on the stairs.  Make sure that there are handrails on both sides of the stairs and use them. Fix handrails that are broken or loose. Make sure that handrails are as long as the stairways.  Check any carpeting to make sure that it is firmly attached to the stairs. Fix any carpet that is loose or worn.  Avoid having throw rugs at the top or bottom of the stairs. If you do have throw rugs, attach them to the floor with carpet tape.  Make sure that you have a light switch at the top of the stairs and the bottom of the stairs. If you do not have them, ask someone to add them for you. What else can I do to help prevent falls?  Wear shoes that:  Do not have high heels.  Have rubber bottoms.  Are comfortable and fit you well.  Are  closed at the toe. Do not wear sandals.  If you use a stepladder:  Make sure that it is fully opened. Do not climb a closed stepladder.  Make sure that both sides of the stepladder are locked into place.  Ask someone to hold it for you, if possible.  Clearly mark and make sure that you can see:  Any grab bars or handrails.  First and last steps.  Where the edge of each step is.  Use tools that help you move around (mobility aids) if they are needed. These include:  Canes.  Walkers.  Scooters.  Crutches.  Turn on the lights when you go into a dark area. Replace any light bulbs as soon as they burn out.  Set up your furniture so you have a clear path. Avoid moving your furniture around.  If any of your floors are uneven, fix them.  If there are any pets around you, be aware of where they are.  Review your medicines with your doctor. Some medicines can make you feel dizzy. This can increase your chance of falling. Ask your doctor what other things that you can do to help prevent falls. This information is not intended to replace advice given to you by your health care provider. Make sure you discuss any questions you have with your health care provider. Document Released: 05/30/2009 Document Revised: 01/09/2016 Document Reviewed: 09/07/2014 Elsevier Interactive Patient Education  2017 [employer].

## 2017-08-05 NOTE — Assessment & Plan Note (Signed)
Hypothyroidism, continue Levothyroxine 117mcg, last TSH 0.87 04/15/17. Update TSH

## 2017-08-05 NOTE — Assessment & Plan Note (Signed)
BPH controlled on Finasteride 5mg  daily and Tamsullosin 0.4mg  qd. Night urination 0-1 per night.

## 2017-08-11 ENCOUNTER — Telehealth: Payer: Self-pay | Admitting: *Deleted

## 2017-08-11 NOTE — Telephone Encounter (Signed)
Return call to patient regarding his lab done by his neurologist(,Dr. Posey Pronto ). The patient  called this morning to ask if I would check on this ab because he received a bill for$ 274.47 from [place]. I checked with [facility] though my lab person at the office and it has been completed. Left message for him to return my call so that I could explain it to him. If I don't received a return call by tomorrow at 12:00 noon will try to reach him again.

## 2017-08-13 ENCOUNTER — Telehealth: Payer: Self-pay | Admitting: *Deleted

## 2017-08-13 NOTE — Telephone Encounter (Signed)
Talked with patient regarding his lab results, and informed him that [facility] would like for him to stop his B-12 for now. He stated that he would and will call Neurology to set-up next appointment.

## 2017-08-26 ENCOUNTER — Other Ambulatory Visit: Payer: Self-pay

## 2017-08-26 DIAGNOSIS — R269 Unspecified abnormalities of gait and mobility: Secondary | ICD-10-CM | POA: Diagnosis not present

## 2017-08-26 LAB — CBC
HCT: 30.5 % — ABNORMAL LOW (ref 38.5–50.0)
Hemoglobin: 10.7 g/dL — ABNORMAL LOW (ref 13.2–17.1)
MCH: 34.2 pg — ABNORMAL HIGH (ref 27.0–33.0)
MCHC: 35.1 g/dL (ref 32.0–36.0)
MCV: 97.4 fL (ref 80.0–100.0)
MPV: 9.5 fL (ref 7.5–12.5)
Platelets: 274 10*3/uL (ref 140–400)
RBC: 3.13 10*6/uL — ABNORMAL LOW (ref 4.20–5.80)
RDW: 12.6 % (ref 11.0–15.0)
WBC: 7.3 10*3/uL (ref 3.8–10.8)

## 2017-09-06 ENCOUNTER — Other Ambulatory Visit: Payer: Self-pay | Admitting: *Deleted

## 2017-09-06 ENCOUNTER — Other Ambulatory Visit: Payer: Self-pay

## 2017-09-06 DIAGNOSIS — E039 Hypothyroidism, unspecified: Secondary | ICD-10-CM

## 2017-09-06 DIAGNOSIS — R269 Unspecified abnormalities of gait and mobility: Secondary | ICD-10-CM

## 2017-09-09 ENCOUNTER — Other Ambulatory Visit: Payer: Self-pay

## 2017-09-09 LAB — LIPID PANEL
Cholesterol: 150 mg/dL (ref <200)
HDL: 56 mg/dL (ref >40)
LDL Cholesterol (Calc): 79 mg/dL (calc)
Non-HDL Cholesterol (Calc): 94 mg/dL (calc) (ref <130)
Total CHOL/HDL Ratio: 2.7 (calc) (ref <5.0)
Triglycerides: 73 mg/dL (ref <150)

## 2017-09-09 LAB — TSH: TSH: 0.06 mIU/L — ABNORMAL LOW (ref 0.40–4.50)

## 2017-09-13 ENCOUNTER — Other Ambulatory Visit: Payer: Self-pay | Admitting: *Deleted

## 2017-09-13 DIAGNOSIS — E038 Other specified hypothyroidism: Secondary | ICD-10-CM

## 2017-09-13 DIAGNOSIS — E039 Hypothyroidism, unspecified: Secondary | ICD-10-CM

## 2017-09-13 MED ORDER — LEVOTHYROXINE SODIUM 100 MCG PO TABS
100.0000 ug | ORAL_TABLET | Freq: Every day | ORAL | 0 refills | Status: DC
Start: 2017-09-13 — End: 2017-11-03

## 2017-09-21 ENCOUNTER — Other Ambulatory Visit: Payer: Self-pay

## 2017-09-21 DIAGNOSIS — R609 Edema, unspecified: Secondary | ICD-10-CM

## 2017-09-21 MED ORDER — FUROSEMIDE 20 MG PO TABS: 20.0000 mg | ORAL_TABLET | Freq: Every day | ORAL | 2 refills | Status: DC

## 2017-09-21 NOTE — Telephone Encounter (Signed)
Patient called the clinic this morning needing a refill on his Lasix. Pharmacy verified by the patient. Script has been sent and patient is aware.

## 2017-09-29 ENCOUNTER — Telehealth: Payer: Self-pay | Admitting: *Deleted

## 2017-09-29 NOTE — Telephone Encounter (Signed)
Patient called requesting to speak with [facility] medical assistant. Patient would like to speak with you regarding his medications. Would like for you to call him today at [phone]

## 2017-09-29 NOTE — Telephone Encounter (Signed)
Return call to [place], he was not home. I will try to reach him again later today.

## 2017-10-01 NOTE — Telephone Encounter (Signed)
Spoke with Cory Burton regarding his levothyroxine medication, he stated that he just wanted to verify it mg since he had to change pharmacy with the new insurance.[name]

## 2017-10-19 ENCOUNTER — Other Ambulatory Visit: Payer: Medicare Other

## 2017-10-19 DIAGNOSIS — E039 Hypothyroidism, unspecified: Secondary | ICD-10-CM

## 2017-10-19 LAB — TSH: TSH: 0.3 mIU/L — ABNORMAL LOW (ref 0.40–4.50)

## 2017-10-21 ENCOUNTER — Encounter: Payer: Self-pay | Admitting: Nurse Practitioner

## 2017-10-29 ENCOUNTER — Telehealth: Payer: Self-pay | Admitting: *Deleted

## 2017-10-29 NOTE — Telephone Encounter (Signed)
Cory Burton called regarding a phone call he received to remind him that he need his pneumonia and TDAP vaccine. I informed him that I would speak with [name] regarding a script for these vaccines that he can pick up later today.

## 2017-11-03 ENCOUNTER — Other Ambulatory Visit: Payer: Self-pay | Admitting: Nurse Practitioner

## 2017-11-03 DIAGNOSIS — E038 Other specified hypothyroidism: Secondary | ICD-10-CM

## 2017-11-10 ENCOUNTER — Telehealth: Payer: Self-pay | Admitting: *Deleted

## 2017-11-10 NOTE — Telephone Encounter (Signed)
Mr Graefe called regarding a script for his Prevnar 38 and TDAP  Vaccines. Manxie wrote a script for him and he came an picked it at before lunch. He also had questions regarding the medication losartan, he will call the pharmacy to inquire about his questions.

## 2017-11-15 ENCOUNTER — Other Ambulatory Visit: Payer: Self-pay | Admitting: *Deleted

## 2017-11-15 DIAGNOSIS — E039 Hypothyroidism, unspecified: Secondary | ICD-10-CM

## 2017-11-18 ENCOUNTER — Other Ambulatory Visit: Payer: Medicare Other

## 2017-11-23 ENCOUNTER — Other Ambulatory Visit: Payer: Medicare Other

## 2017-11-23 LAB — TSH: TSH: 0.73 mIU/L (ref 0.40–4.50)

## 2017-12-07 ENCOUNTER — Encounter: Payer: Self-pay | Admitting: Internal Medicine

## 2017-12-07 ENCOUNTER — Non-Acute Institutional Stay: Payer: Medicare Other | Admitting: Internal Medicine

## 2017-12-07 VITALS — BP 118/62 | HR 74 | Temp 97.9°F | Resp 16 | Ht 65.0 in | Wt 138.2 lb

## 2017-12-07 DIAGNOSIS — N400 Enlarged prostate without lower urinary tract symptoms: Secondary | ICD-10-CM | POA: Diagnosis not present

## 2017-12-07 DIAGNOSIS — E039 Hypothyroidism, unspecified: Secondary | ICD-10-CM | POA: Diagnosis not present

## 2017-12-07 DIAGNOSIS — D649 Anemia, unspecified: Secondary | ICD-10-CM

## 2017-12-07 DIAGNOSIS — L989 Disorder of the skin and subcutaneous tissue, unspecified: Secondary | ICD-10-CM | POA: Diagnosis not present

## 2017-12-07 DIAGNOSIS — I739 Peripheral vascular disease, unspecified: Secondary | ICD-10-CM | POA: Diagnosis not present

## 2017-12-07 DIAGNOSIS — I10 Essential (primary) hypertension: Secondary | ICD-10-CM

## 2017-12-07 NOTE — Patient Instructions (Signed)
  Please wear compression stockings to both your legs- apply them at daytime and remove it at bedtime to help with your leg swelling. Keep legs elevated when possible.   See your skin doctor for lesion on your back as discussed today.  Take your medications as prescribed.   You will be seen by [facility] for your physical in 4 months time.

## 2017-12-07 NOTE — Progress Notes (Signed)
[facility]  Provider: Blanchie Serve MD   Location:  [place] of Service:  Clinic (12)  PCP: Blanchie Serve, MD Patient Care Team: Blanchie Serve, MD as PCP - General (Internal Medicine) [place], Man X, NP as Nurse Practitioner (Nurse Practitioner) Earlie Server, MD as Consulting Physician (Orthopedic Surgery) Jovita Gamma, MD as Consulting Physician (Neurosurgery) Rutherford Guys, MD as Consulting Physician (Ophthalmology) Jettie Booze, MD as Consulting Physician (Cardiology) Marchia Bond, MD as Consulting Physician (Orthopedic Surgery) Hurman Horn, MD as Consulting Physician (Ophthalmology) Verner Chol, MD as Consulting Physician (Sports Medicine)  Extended Emergency Contact Information Primary Emergency Contact: foust,lucinda Home Phone: [phone] Relation: Daughter   Goals of Care: Advanced Directive information Advanced Directives 08/05/2017  Does Patient Have a Medical Advance Directive? Yes  Type of Paramedic of [place];Living will  Does patient want to make changes to medical advance directive? No - Patient declined  Copy of [place] in Chart? Yes  Would patient like information on creating a medical advance directive? -  Pre-existing out of facility DNR order (yellow form or pink MOST form) -      Chief Complaint  Patient presents with  . Medical Management of Chronic Issues    4 month follow up. Patient stated that he has a spot on his left shoulder blade that is bothering him.   . Medication Refill    No refills needed at this time    HPI: Patient is a 84 y.o. male seen today for routine visit. He has acute concern. He is concerned about an area on left shoulder. this has been there for 10 days, itches, denies pain. He denies increase in size. He provides hx of skin cancer and be seen by dermatology in past. He continues to  have numbness to his fingertips for several months and has some trouble buttoning shirts with tiny buttons. He is able to button other shirts. Denies dropping of objects. His edema improves in the morning on waking up and increases as the day progresses. He is taking his lasix and kcl supplement. He takes tamsulosin and finasteride and tolerating it well. He is followed by urology, dermatology, ophthalmology. Taking levothyroxine and TSH level normalized on lab review.   Past Medical History:  Diagnosis Date  . Acute posthemorrhagic anemia 12/23/2011  . Anemia 03/06/2015  . BPH (benign prostatic hyperplasia)   . Cardiomegaly 12/24/2011  . Degeneration of thoracic or thoracolumbar intervertebral disc 12/24/2011  . Edema 04/28/2012  . Fracture, intertrochanteric, left femur ([place]) 12/17/2011  . Hypertension   . Other abnormal blood chemistry 12/24/2011  . Senile osteoporosis 08/25/2012  . Spinal stenosis, lumbar region, without neurogenic claudication 12/24/2011  . Thyroid disease   . Unspecified hearing loss 12/24/2011  . Unspecified hypothyroidism 12/23/2011   Past Surgical History:  Procedure Laterality Date  . COMPRESSION HIP SCREW  12/17/2011   Procedure: COMPRESSION HIP;  Surgeon: Johnny Bridge, MD;  Location: [facility];  Service: Orthopedics;  Laterality: Left;  . FOOT SURGERY      reports that he quit smoking about 32 years ago. His smoking use included pipe. He has never used smokeless tobacco. He reports that he drinks about 4.2 oz of alcohol per week. He reports that he does not use drugs. Social History   Socioeconomic History  . Marital status: Widowed    Spouse name: Not on file  . Number of children: 3  .  Years of education: 32  . Highest education level: Not on file  Occupational History  . Occupation: retired Glass blower/designer  . Financial resource strain: Not hard at all  . Food insecurity:    Worry: Never true    Inability: Never true  . Transportation needs:    Medical: No     Non-medical: No  Tobacco Use  . Smoking status: Former Smoker    Types: Pipe    Last attempt to quit: 04/27/1985    Years since quitting: 32.6  . Smokeless tobacco: Never Used  Substance and Sexual Activity  . Alcohol use: Yes    Alcohol/week: 4.2 oz    Types: 7 Glasses of wine per week    Comment: 50 oz a week  . Drug use: No  . Sexual activity: Never  Lifestyle  . Physical activity:    Days per week: 7 days    Minutes per session: 20 min  . Stress: Not at all  Relationships  . Social connections:    Talks on phone: Twice a week    Gets together: Never    Attends religious service: Never    Active member of club or organization: Yes    Attends meetings of clubs or organizations: 1 to 4 times per year    Relationship status: Widowed  . Intimate partner violence:    Fear of current or ex partner: No    Emotionally abused: No    Physically abused: No    Forced sexual activity: No  Other Topics Concern  . Not on file  Social History Narrative   Lives at [facility]      DIET: none       DO YOU DRINK/EAT THINGS WITH CAFFEINE: coffee and tea      MARITAL STATUS: widow since 2016      WHAT YEAR WERE YOU MARRIED:      DO YOU LIVE IN A HOUSE, APARTMENT, ASSISTED LIVING, CONDO TRAILER ETC.:      IS IT ONE OR MORE STORIES:      HOW MANY PERSONS LIVE IN YOUR HOME:      DO YOU HAVE PETS IN YOUR HOME:      CURRENT OR PAST PROFESSION:      DO YOU EXERCISE: yes , WHAT TYPE AND HOW OFTEN: stand behind chair and work legs              Functional Status Survey:    Family History  Problem Relation Age of Onset  . Cancer Mother   . Other Father        accident    Health Maintenance  Topic Date Due  . INFLUENZA VACCINE  03/17/2018  . DEXA SCAN  05/15/2018  . TETANUS/TDAP  11/12/2027  . PNA vac Low Risk Adult  Completed    Allergies  Allergen Reactions  . Clindamycin/Lincomycin     Developed C. dificile  . Horse-Derived Products     Horse serum    . Mold Extract [Trichophyton]     Outpatient Encounter Medications as of 12/07/2017  Medication Sig  . aspirin 81 MG tablet Take 81 mg by mouth daily.  . B Complex Vitamins (VITAMIN B COMPLEX PO) Take 1 tablet by mouth daily.   . Cholecalciferol (VITAMIN D) 2000 UNITS tablet Take 2,000 Units by mouth daily.  . Clobetasol Prop Emollient Base 0.05 % emollient cream Apply topically 2 (two) times daily.  . feeding supplement, ENSURE ENLIVE, (ENSURE ENLIVE) LIQD  Take 237 mLs by mouth 2 (two) times daily between meals.  . finasteride (PROSCAR) 5 MG tablet Take 5 mg by mouth daily.  . furosemide (LASIX) 20 MG tablet Take 1 tablet (20 mg total) by mouth daily.  [name] ibuprofen (ADVIL) 200 MG tablet Take one tablet at breakfast and supper  . Lactobacillus (PROBIOTIC ACIDOPHILUS PO) Take by mouth daily.  [name] levothyroxine (SYNTHROID, LEVOTHROID) 100 MCG tablet TAKE 1 TABLET BY MOUTH EVERY DAY  . losartan (COZAAR) 50 MG tablet TAKE 1/2 TABLET DAILY TO   STRENGTHEN HEART AND       CONTROL BLOOD PRESSURE  . Multiple Vitamin (MULTIVITAMIN) tablet Take 1 tablet by mouth daily.  . mupirocin ointment (BACTROBAN) 2 % APPLY TO LEFT KNEE WOUND DAILY  . nystatin-triamcinolone (MYCOLOG II) cream APPLY TO LOWER LEFT EXTREMITY TWICE A DAY  . potassium chloride SA (K-DUR,KLOR-CON) 20 MEQ tablet Take 1 tablet (20 mEq total) by mouth daily.  [name] Resveratrol 250 MG CAPS Take 250 mg by mouth daily.  . Tamsulosin HCl (FLOMAX) 0.4 MG CAPS Take 0.4 mg by mouth daily.  . vitamin C (ASCORBIC ACID) 500 MG tablet Take 500 mg by mouth daily.  . vitamin E (VITAMIN E) 400 UNIT capsule Take 400 Units by mouth daily.   No facility-administered encounter medications on file as of 12/07/2017.     Review of Systems  Constitutional: Positive for fatigue. Negative for appetite change, chills and fever.  HENT: Positive for postnasal drip. Negative for congestion, mouth sores, nosebleeds, rhinorrhea, sore throat and trouble swallowing.    Eyes: Positive for visual disturbance.       Has corrective glasses  Respiratory: Negative for cough and shortness of breath.   Cardiovascular: Positive for leg swelling. Negative for chest pain and palpitations.  Gastrointestinal: Negative for abdominal pain, blood in stool, constipation, diarrhea, nausea and vomiting.  Genitourinary: Negative for dysuria, frequency and hematuria.  Musculoskeletal: Positive for gait problem. Negative for back pain and joint swelling.       Uses a cane, chronic low back pain, denies radiculopathy, no fall reported  Skin: Positive for rash.  Neurological: Positive for numbness. Negative for dizziness, syncope and headaches.  Hematological: Bruises/bleeds easily.  Psychiatric/Behavioral: Negative for behavioral problems and confusion.    Vitals:   12/07/17 0857  BP: 118/62  Pulse: 74  Resp: 16  Temp: 97.9 F (36.6 C)  TempSrc: Oral  SpO2: 98%  Weight: 138 lb 3.2 oz (62.7 kg)  Height: _0  (1.651 m)   Body mass index is 23 kg/m.   Wt Readings from Last 3 Encounters:  12/07/17 138 lb 3.2 oz (62.7 kg)  08/05/17 134 lb (60.8 kg)  08/05/17 134 lb (60.8 kg)   Physical Exam  Constitutional: He is oriented to person, place, and time. He appears well-developed and well-nourished. No distress.  HENT:  Head: Normocephalic and atraumatic.  Right Ear: External ear normal.  Left Ear: External ear normal.  Nose: Nose normal.  Mouth/Throat: Oropharynx is clear and moist.  Eyes: Pupils are equal, round, and reactive to light. EOM are normal. Right eye exhibits no discharge. Left eye exhibits no discharge.  Neck: Normal range of motion. Neck supple. No thyromegaly present.  Cardiovascular: Normal rate and regular rhythm.  Pulmonary/Chest: Effort normal and breath sounds normal.  Abdominal: Soft. Bowel sounds are normal. There is no tenderness.  Musculoskeletal: Normal range of motion. He exhibits edema. He exhibits no deformity.  1+ pitting ankle and  feet edema  Lymphadenopathy:  He has no cervical adenopathy.  Neurological: He is alert and oriented to person, place, and time.  Skin: Skin is warm and dry. Capillary refill takes 2 to 3 seconds. He is not diaphoretic. There is erythema.  Has a raised lesion to left upper back with mild erythema at base, irregular border, no drainage, some old blood from scratching  Psychiatric: He has a normal mood and affect. His behavior is normal.    Labs reviewed: Basic Metabolic Panel: Recent Labs    07/29/17 0735  NA 137  K 4.4  CL 104  CO2 29  GLUCOSE 98  BUN 24  CREATININE 1.04  CALCIUM 9.7   Liver Function Tests: No results for input(s): AST, ALT, ALKPHOS, BILITOT, PROT, ALBUMIN in the last 8760 hours. No results for input(s): LIPASE, AMYLASE in the last 8760 hours. No results for input(s): AMMONIA in the last 8760 hours. CBC: Recent Labs    08/26/17 0700  WBC 7.3  HGB 10.7*  HCT 30.5*  MCV 97.4  PLT 274   Cardiac Enzymes: No results for input(s): CKTOTAL, CKMB, CKMBINDEX, TROPONINI in the last 8760 hours. BNP: Invalid input(s): POCBNP No results found for: HGBA1C Lab Results  Component Value Date   TSH 0.73 11/23/2017   Lab Results  Component Value Date   VITAMINB12 >1500 (H) 07/23/2017   No results found for: FOLATE No results found for: IRON, TIBC, FERRITIN  Lipid Panel: Recent Labs    09/06/17 0000  CHOL 150  HDL 56  LDLCALC 79  TRIG 73  CHOLHDL 2.7   No results found for: HGBA1C  Procedures since last visit: No results found.  Assessment/Plan  1. Anemia, unspecified type - CMP with eGFR([facility]); Future - CBC (no diff); Future  2. Essential hypertension Continue losartan 25 mg daily, bmp from 12/18 reviewed. check bmp  3. Hypothyroidism, unspecified type Lab Results  Component Value Date   TSH 0.73 11/23/2017   Stable continue levothyroxine 100 mcg daily  4. Benign prostatic hyperplasia without lower urinary tract symptoms Continue  finasteride and tamsulosin, monitor symptom  5. Skin lesion New lesion for about 2 weeks, itching, irregular border and has history of skin cancer, refer to dermatology, patient to set up appointment  6. PVD Keep legs elevated at rest, apply ted hose, continue lasix and kcl, check bmp   Labs/tests ordered:   Lab Orders     CMP with eGFR([facility])     CBC (no diff)   Next appointment: 4 months with Man Darlina Rumpf for physical  Communication: reviewed care plan with patient    Blanchie Serve, MD Internal Medicine [place], [place] [address] Cell Phone (Monday-Friday 8 am - 5 pm): [phone] On Call: [phone] and follow prompts after 5 pm and on weekends Office Phone: [phone] Office Fax: [phone]

## 2017-12-14 ENCOUNTER — Other Ambulatory Visit: Payer: Medicare Other

## 2017-12-14 DIAGNOSIS — D649 Anemia, unspecified: Secondary | ICD-10-CM

## 2017-12-14 LAB — COMPLETE METABOLIC PANEL WITH GFR
AG Ratio: 1.7 (calc) (ref 1.0–2.5)
ALT: 13 U/L (ref 9–46)
AST: 23 U/L (ref 10–35)
Albumin: 3.8 g/dL (ref 3.6–5.1)
Alkaline phosphatase (APISO): 65 U/L (ref 40–115)
BUN/Creatinine Ratio: 25 (calc) — ABNORMAL HIGH (ref 6–22)
BUN: 28 mg/dL — ABNORMAL HIGH (ref 7–25)
CO2: 28 mmol/L (ref 20–32)
Calcium: 9.4 mg/dL (ref 8.6–10.3)
Chloride: 105 mmol/L (ref 98–110)
Creat: 1.1 mg/dL (ref 0.70–1.11)
GFR, Est African American: 65 mL/min/{1.73_m2} (ref >=60)
GFR, Est Non African American: 56 mL/min/{1.73_m2} — ABNORMAL LOW (ref >=60)
Globulin: 2.2 g/dL (calc) (ref 1.9–3.7)
Glucose, Bld: 91 mg/dL (ref 65–99)
Potassium: 4.3 mmol/L (ref 3.5–5.3)
Sodium: 138 mmol/L (ref 135–146)
Total Bilirubin: 0.5 mg/dL (ref 0.2–1.2)
Total Protein: 6 g/dL — ABNORMAL LOW (ref 6.1–8.1)

## 2017-12-14 LAB — CBC
HCT: 32.4 % — ABNORMAL LOW (ref 38.5–50.0)
Hemoglobin: 11 g/dL — ABNORMAL LOW (ref 13.2–17.1)
MCH: 33.3 pg — ABNORMAL HIGH (ref 27.0–33.0)
MCHC: 34 g/dL (ref 32.0–36.0)
MCV: 98.2 fL (ref 80.0–100.0)
MPV: 9.6 fL (ref 7.5–12.5)
Platelets: 262 10*3/uL (ref 140–400)
RBC: 3.3 10*6/uL — ABNORMAL LOW (ref 4.20–5.80)
RDW: 12.9 % (ref 11.0–15.0)
WBC: 7.3 10*3/uL (ref 3.8–10.8)

## 2017-12-21 ENCOUNTER — Non-Acute Institutional Stay: Payer: Medicare Other | Admitting: Internal Medicine

## 2017-12-21 ENCOUNTER — Encounter: Payer: Self-pay | Admitting: Internal Medicine

## 2017-12-21 VITALS — BP 122/60 | HR 65 | Temp 97.6°F | Resp 18 | Ht 65.0 in | Wt 133.4 lb

## 2017-12-21 DIAGNOSIS — D638 Anemia in other chronic diseases classified elsewhere: Secondary | ICD-10-CM | POA: Diagnosis not present

## 2017-12-21 DIAGNOSIS — N183 Chronic kidney disease, stage 3 unspecified: Secondary | ICD-10-CM

## 2017-12-21 DIAGNOSIS — R609 Edema, unspecified: Secondary | ICD-10-CM | POA: Diagnosis not present

## 2017-12-21 DIAGNOSIS — I10 Essential (primary) hypertension: Secondary | ICD-10-CM | POA: Diagnosis not present

## 2017-12-21 MED ORDER — FUROSEMIDE 20 MG PO TABS: 10.0000 mg | ORAL_TABLET | Freq: Every day | ORAL | 2 refills | Status: DC

## 2017-12-21 NOTE — Progress Notes (Signed)
[facility]  Provider: Blanchie Serve MD   Location:  [place] of Service:  Clinic (12)  PCP: Blanchie Serve, MD Patient Care Team: Blanchie Serve, MD as PCP - General (Internal Medicine) [place], Man X, NP as Nurse Practitioner (Nurse Practitioner) Earlie Server, MD as Consulting Physician (Orthopedic Surgery) Jovita Gamma, MD as Consulting Physician (Neurosurgery) Rutherford Guys, MD as Consulting Physician (Ophthalmology) Jettie Booze, MD as Consulting Physician (Cardiology) Marchia Bond, MD as Consulting Physician (Orthopedic Surgery) Hurman Horn, MD as Consulting Physician (Ophthalmology) Verner Chol, MD as Consulting Physician (Sports Medicine)  Extended Emergency Contact Information Primary Emergency Contact: foust,lucinda Home Phone: [phone] Relation: Daughter   Goals of Care: Advanced Directive information Advanced Directives 08/05/2017  Does Patient Have a Medical Advance Directive? Yes  Type of Paramedic of [place];Living will  Does patient want to make changes to medical advance directive? No - Patient declined  Copy of [place] in Chart? Yes  Would patient like information on creating a medical advance directive? -  Pre-existing out of facility DNR order (yellow form or pink MOST form) -      Chief Complaint  Patient presents with  . Acute Visit    lab discussion    HPI: Patient is a 84 y.o. male seen today for acute visit for abnormal labs. His lab work shows worsening renal function. On chart review, he is on multiple medication that can worsen renal function. He denies any new urinary complaints. He has BPH and follows with urology. has upcoming appointment with them. He mentions that swelling to leg is minimal. He has PVD. His edema improves in the morning on waking up and increases as the day progresses.    Past  Medical History:  Diagnosis Date  . Acute posthemorrhagic anemia 12/23/2011  . Anemia 03/06/2015  . BPH (benign prostatic hyperplasia)   . Cardiomegaly 12/24/2011  . Degeneration of thoracic or thoracolumbar intervertebral disc 12/24/2011  . Edema 04/28/2012  . Fracture, intertrochanteric, left femur ([place]) 12/17/2011  . Hypertension   . Other abnormal blood chemistry 12/24/2011  . Senile osteoporosis 08/25/2012  . Spinal stenosis, lumbar region, without neurogenic claudication 12/24/2011  . Thyroid disease   . Unspecified hearing loss 12/24/2011  . Unspecified hypothyroidism 12/23/2011   Past Surgical History:  Procedure Laterality Date  . COMPRESSION HIP SCREW  12/17/2011   Procedure: COMPRESSION HIP;  Surgeon: Johnny Bridge, MD;  Location: [facility];  Service: Orthopedics;  Laterality: Left;  . FOOT SURGERY      reports that he quit smoking about 32 years ago. His smoking use included pipe. He has never used smokeless tobacco. He reports that he drinks about 4.2 oz of alcohol per week. He reports that he does not use drugs. Social History   Socioeconomic History  . Marital status: Widowed    Spouse name: Not on file  . Number of children: 3  . Years of education: 3  . Highest education level: Not on file  Occupational History  . Occupation: retired Glass blower/designer  . Financial resource strain: Not hard at all  . Food insecurity:    Worry: Never true    Inability: Never true  . Transportation needs:    Medical: No    Non-medical: No  Tobacco Use  . Smoking status: Former Smoker    Types: Pipe    Last attempt to quit: 04/27/1985  Years since quitting: 32.6  . Smokeless tobacco: Never Used  Substance and Sexual Activity  . Alcohol use: Yes    Alcohol/week: 4.2 oz    Types: 7 Glasses of wine per week    Comment: 50 oz a week  . Drug use: No  . Sexual activity: Never  Lifestyle  . Physical activity:    Days per week: 7 days    Minutes per session: 20 min  . Stress: Not at  all  Relationships  . Social connections:    Talks on phone: Twice a week    Gets together: Never    Attends religious service: Never    Active member of club or organization: Yes    Attends meetings of clubs or organizations: 1 to 4 times per year    Relationship status: Widowed  . Intimate partner violence:    Fear of current or ex partner: No    Emotionally abused: No    Physically abused: No    Forced sexual activity: No  Other Topics Concern  . Not on file  Social History Narrative   Lives at [facility]      DIET: none       DO YOU DRINK/EAT THINGS WITH CAFFEINE: coffee and tea      MARITAL STATUS: widow since 2016      WHAT YEAR WERE YOU MARRIED:      DO YOU LIVE IN A HOUSE, APARTMENT, ASSISTED LIVING, CONDO TRAILER ETC.:      IS IT ONE OR MORE STORIES:      HOW MANY PERSONS LIVE IN YOUR HOME:      DO YOU HAVE PETS IN YOUR HOME:      CURRENT OR PAST PROFESSION:      DO YOU EXERCISE: yes , WHAT TYPE AND HOW OFTEN: stand behind chair and work legs              Functional Status Survey:    Family History  Problem Relation Age of Onset  . Cancer Mother   . Other Father        accident    Health Maintenance  Topic Date Due  . INFLUENZA VACCINE  03/17/2018  . DEXA SCAN  05/15/2018  . TETANUS/TDAP  11/12/2027  . PNA vac Low Risk Adult  Completed    Allergies  Allergen Reactions  . Clindamycin/Lincomycin     Developed C. dificile  . Horse-Derived Products     Horse serum  . Mold Extract [Trichophyton]     Outpatient Encounter Medications as of 12/21/2017  Medication Sig  . aspirin 81 MG tablet Take 81 mg by mouth daily.  . Cholecalciferol (VITAMIN D) 2000 UNITS tablet Take 2,000 Units by mouth daily.  . Clobetasol Prop Emollient Base 0.05 % emollient cream Apply topically 2 (two) times daily.  . feeding supplement, ENSURE ENLIVE, (ENSURE ENLIVE) LIQD Take 237 mLs by mouth 2 (two) times daily between meals.  . finasteride (PROSCAR) 5  MG tablet Take 5 mg by mouth daily.  . furosemide (LASIX) 20 MG tablet Take 0.5 tablets (10 mg total) by mouth daily.  . Lactobacillus (PROBIOTIC ACIDOPHILUS PO) Take by mouth daily.  [name] levothyroxine (SYNTHROID, LEVOTHROID) 100 MCG tablet TAKE 1 TABLET BY MOUTH EVERY DAY  . losartan (COZAAR) 50 MG tablet TAKE 1/2 TABLET DAILY TO   STRENGTHEN HEART AND       CONTROL BLOOD PRESSURE  . Multiple Vitamin (MULTIVITAMIN) tablet Take 1 tablet by mouth daily.  [name]  nystatin-triamcinolone (MYCOLOG II) cream APPLY TO LOWER LEFT EXTREMITY TWICE A DAY  . potassium chloride SA (K-DUR,KLOR-CON) 20 MEQ tablet Take 1 tablet (20 mEq total) by mouth daily.  [name] Resveratrol 250 MG CAPS Take 250 mg by mouth daily.  . Tamsulosin HCl (FLOMAX) 0.4 MG CAPS Take 0.4 mg by mouth daily.  . vitamin C (ASCORBIC ACID) 500 MG tablet Take 500 mg by mouth daily.  . vitamin E (VITAMIN E) 400 UNIT capsule Take 400 Units by mouth daily.  . [DISCONTINUED] furosemide (LASIX) 20 MG tablet Take 1 tablet (20 mg total) by mouth daily.  . [DISCONTINUED] ibuprofen (ADVIL) 200 MG tablet Take one tablet at breakfast and supper  . B Complex Vitamins (VITAMIN B COMPLEX PO) Take 1 tablet by mouth daily.   . mupirocin ointment (BACTROBAN) 2 % APPLY TO LEFT KNEE WOUND DAILY   No facility-administered encounter medications on file as of 12/21/2017.     Review of Systems  Constitutional: Negative for appetite change.       Energy level is fare, working with therapy team and exercising  Eyes: Positive for visual disturbance.       Has corrective glasses  Respiratory: Negative for cough, shortness of breath and wheezing.   Cardiovascular:       Denies chest pain or palpitation, leg edema has improved  Gastrointestinal: Negative for abdominal pain, nausea and vomiting.  Genitourinary: Negative for decreased urine volume, dysuria, flank pain, frequency and hematuria.  Musculoskeletal: Positive for gait problem.       Uses a cane, chronic low back  pain, denies radiculopathy, no fall reported  Neurological: Negative for dizziness and headaches.  Hematological: Bruises/bleeds easily.  Psychiatric/Behavioral: Negative for confusion.    Vitals:   12/21/17 1058  BP: 122/60  Pulse: 65  Resp: 18  Temp: 97.6 F (36.4 C)  TempSrc: Oral  SpO2: 97%  Weight: 133 lb 6.4 oz (60.5 kg)  Height: '5\' 5"'$  (1.651 m)   Body mass index is 22.2 kg/m.   Wt Readings from Last 3 Encounters:  12/21/17 133 lb 6.4 oz (60.5 kg)  12/07/17 138 lb 3.2 oz (62.7 kg)  08/05/17 134 lb (60.8 kg)   Physical Exam  Constitutional: He is oriented to person, place, and time. He appears well-developed and well-nourished. No distress.  HENT:  Head: Normocephalic and atraumatic.  Neck: Neck supple.  Cardiovascular: Normal rate and regular rhythm.  Pulmonary/Chest: Effort normal and breath sounds normal.  Abdominal: Soft. Bowel sounds are normal. There is no tenderness.  Musculoskeletal: Normal range of motion. He exhibits edema. He exhibits no deformity.  Trace left and 1+ right ankle edema  Neurological: He is alert and oriented to person, place, and time.  Skin: Skin is warm and dry. He is not diaphoretic.  Psychiatric: He has a normal mood and affect. His behavior is normal.    Labs reviewed: Basic Metabolic Panel: Recent Labs    07/29/17 0735 12/14/17 0800  NA 137 138  K 4.4 4.3  CL 104 105  CO2 29 28  GLUCOSE 98 91  BUN 24 28*  CREATININE 1.04 1.10  CALCIUM 9.7 9.4   Liver Function Tests: Recent Labs    12/14/17 0800  AST 23  ALT 13  BILITOT 0.5  PROT 6.0*   No results for input(s): LIPASE, AMYLASE in the last 8760 hours. No results for input(s): AMMONIA in the last 8760 hours. CBC: Recent Labs    08/26/17 0700 12/14/17 0800  WBC 7.3 7.3  HGB 10.7* 11.0*  HCT 30.5* 32.4*  MCV 97.4 98.2  PLT 274 262   Cardiac Enzymes: No results for input(s): CKTOTAL, CKMB, CKMBINDEX, TROPONINI in the last 8760 hours. BNP: Invalid  input(s): POCBNP No results found for: HGBA1C Lab Results  Component Value Date   TSH 0.73 11/23/2017   Lab Results  Component Value Date   VITAMINB12 >1500 (H) 07/23/2017   No results found for: FOLATE No results found for: IRON, TIBC, FERRITIN  Lipid Panel: Recent Labs    09/06/17 0000  CHOL 150  HDL 56  LDLCALC 79  TRIG 73  CHOLHDL 2.7   No results found for: HGBA1C  Procedures since last visit: No results found.  Assessment/Plan  1. CKD (chronic kidney disease) stage 3, GFR 30-59 ml/min (HCC) D/c ibuprofen. Advised not to take any NSAIDs for now for pain, ok to take tylenol. Pt agrees. Decrease lasix dosing as below. If renal function worsens, d/c ARB. Monitor bmp. Maintain hydration - CMP with eGFR([facility]); Future  2. Edema, unspecified type With PVD, decrease lasix to 10 mg daily with renal impairment. Advised to wear compression stockings to help with edema. Keep legs elevated at rest - furosemide (LASIX) 20 MG tablet; Take 0.5 tablets (10 mg total) by mouth daily.  Dispense: 45 tablet; Refill: 2  3. Essential hypertension Continue losartan , monitor BMP  4. Anemia of chronic disease With ckd 3, supportive care, monitor cbc periodically.    Labs/tests ordered:    Lab Orders     CMP with eGFR([facility])   Next appointment: has f/u  Communication: reviewed care plan with patient    Blanchie Serve, MD Internal Medicine [place], [place] [address] Cell Phone (Monday-Friday 8 am - 5 pm): [phone] On Call: [phone] and follow prompts after 5 pm and on weekends Office Phone: [phone] Office Fax: [phone]

## 2017-12-21 NOTE — Patient Instructions (Signed)
  Stop taking advil.  Your lasix has been decreased to 10 mg daily. Take half a tablet of the medication 20 mg supply that you have.   We will need to recheck your kidney function in 4-6 weeks.

## 2017-12-23 ENCOUNTER — Other Ambulatory Visit: Payer: Self-pay | Admitting: *Deleted

## 2017-12-23 DIAGNOSIS — R609 Edema, unspecified: Secondary | ICD-10-CM

## 2017-12-23 DIAGNOSIS — I10 Essential (primary) hypertension: Secondary | ICD-10-CM

## 2017-12-23 MED ORDER — POTASSIUM CHLORIDE CRYS ER 20 MEQ PO TBCR: 20.0000 meq | EXTENDED_RELEASE_TABLET | Freq: Every day | ORAL | 0 refills | Status: DC

## 2017-12-23 MED ORDER — LOSARTAN POTASSIUM 50 MG PO TABS: ORAL_TABLET | ORAL | 0 refills | Status: DC

## 2017-12-29 ENCOUNTER — Other Ambulatory Visit: Payer: Self-pay | Admitting: Dermatology

## 2018-01-20 ENCOUNTER — Telehealth: Payer: Self-pay | Admitting: *Deleted

## 2018-01-20 NOTE — Telephone Encounter (Signed)
Resident fell yesterday at the [employer], had a skin tear on (R) elbow. Called this morning to check on patient to see how he was feeling after his fall. He stated that he is up and moving this morning, preparing brunch.

## 2018-02-02 ENCOUNTER — Other Ambulatory Visit: Payer: Self-pay

## 2018-02-02 DIAGNOSIS — N183 Chronic kidney disease, stage 3 unspecified: Secondary | ICD-10-CM

## 2018-02-03 ENCOUNTER — Other Ambulatory Visit: Payer: Medicare Other

## 2018-02-10 ENCOUNTER — Other Ambulatory Visit: Payer: Self-pay | Admitting: Nurse Practitioner

## 2018-02-10 DIAGNOSIS — R609 Edema, unspecified: Secondary | ICD-10-CM

## 2018-03-15 ENCOUNTER — Other Ambulatory Visit: Payer: Self-pay

## 2018-03-15 DIAGNOSIS — R609 Edema, unspecified: Secondary | ICD-10-CM

## 2018-03-15 MED ORDER — POTASSIUM CHLORIDE CRYS ER 20 MEQ PO TBCR: 20.0000 meq | EXTENDED_RELEASE_TABLET | Freq: Every day | ORAL | 0 refills | Status: DC

## 2018-03-30 ENCOUNTER — Encounter: Payer: Self-pay | Admitting: Internal Medicine

## 2018-04-07 ENCOUNTER — Encounter: Payer: Self-pay | Admitting: Nurse Practitioner

## 2018-04-07 ENCOUNTER — Non-Acute Institutional Stay: Payer: Medicare Other | Admitting: Nurse Practitioner

## 2018-04-07 VITALS — BP 142/62 | HR 72 | Temp 98.0°F | Resp 20 | Ht 65.0 in | Wt 134.0 lb

## 2018-04-07 DIAGNOSIS — R609 Edema, unspecified: Secondary | ICD-10-CM

## 2018-04-07 DIAGNOSIS — R413 Other amnesia: Secondary | ICD-10-CM

## 2018-04-07 DIAGNOSIS — S0001XA Abrasion of scalp, initial encounter: Secondary | ICD-10-CM | POA: Diagnosis not present

## 2018-04-07 DIAGNOSIS — N400 Enlarged prostate without lower urinary tract symptoms: Secondary | ICD-10-CM

## 2018-04-07 DIAGNOSIS — Z0000 Encounter for general adult medical examination without abnormal findings: Secondary | ICD-10-CM | POA: Diagnosis not present

## 2018-04-07 DIAGNOSIS — R200 Anesthesia of skin: Secondary | ICD-10-CM | POA: Insufficient documentation

## 2018-04-07 DIAGNOSIS — I10 Essential (primary) hypertension: Secondary | ICD-10-CM

## 2018-04-07 NOTE — Assessment & Plan Note (Signed)
Continue  Finasteride 5mg  qd, bathroom trips 0-1x/night.

## 2018-04-07 NOTE — Assessment & Plan Note (Signed)
Peripheral edema, trace edema BLE L>R, continue Furosemide 10mg .

## 2018-04-07 NOTE — Patient Instructions (Signed)
F/u in clinic [facility] 6 months. Obtain CBC CMP TSH to evaluate recent fall and memory impairment. Will refer to PT to evaluate and treat as indicated for the left fingers numbness.

## 2018-04-07 NOTE — Progress Notes (Signed)
Location:   clinic [place]   Place of Service:  Clinic (12) Provider: Marlana Latus NP  Code Status: DNR Goals of Care: [place] 04/07/2018  Does Patient Have a Medical Advance Directive? Yes  Type of Paramedic of [place];Living will  Does patient want to make changes to medical advance directive? No - Patient declined  Copy of [place] in Chart? Yes  Would patient like information on creating a medical advance directive? -  Pre-existing out of facility DNR order (yellow form or pink MOST form) -     Chief Complaint  Patient presents with  . Annual Exam    HPI: Patient is a 84 y.o. male seen today for medical management of chronic diseases.     The patient has gradual memory decline, he resides in [facility], still driving, gets around without lost. MMSE today showed 18/30, passed clock drawing. Discussed R vs B of memory preserving meds, the patient and his daughter declined treatment. He sustained a abrasion at back of his head when he pulled on weeds in community garden, no focal neurological symptoms, no s/s of infection, healing nicely. Hx of hypothyroidism, on Levothyroxine 175mcg po daily, last TSH 0.73 11/23/17.  Hx of HTN, blood pressure is controlled on Losartan 50mg  qd. Peripheral edema, trace edema BLE L>R, taking Furosemide 10mg . BHP, on Finasteride 5mg  qd, bathroom trips 0-1x/night. He complained left finger/hand feeling of numbness, denied weakness, not disabling, denied neck pain or injury. Duration and onset is uncertain.   Past Medical History:  Diagnosis Date  . Acute posthemorrhagic anemia 12/23/2011  . Anemia 03/06/2015  . BPH (benign prostatic hyperplasia)   . Cardiomegaly 12/24/2011  . Degeneration of thoracic or thoracolumbar intervertebral disc 12/24/2011  . Edema 04/28/2012  . Fracture, intertrochanteric, left femur ([place]) 12/17/2011  . Hypertension   . Other abnormal blood chemistry 12/24/2011  . Senile osteoporosis  08/25/2012  . Spinal stenosis, lumbar region, without neurogenic claudication 12/24/2011  . Thyroid disease   . Unspecified hearing loss 12/24/2011  . Unspecified hypothyroidism 12/23/2011    Past Surgical History:  Procedure Laterality Date  . COMPRESSION HIP SCREW  12/17/2011   Procedure: COMPRESSION HIP;  Surgeon: Johnny Bridge, MD;  Location: [facility];  Service: Orthopedics;  Laterality: Left;  . FOOT SURGERY      Allergies  Allergen Reactions  . Clindamycin/Lincomycin     Developed C. dificile  . Horse-Derived Products     Horse serum  . Mold Extract [Trichophyton]     Allergies as of 04/07/2018      Reactions   Clindamycin/lincomycin    Developed C. dificile   Horse-derived Products    Horse serum   Mold Extract [trichophyton]       Medication List        Accurate as of 04/07/18  4:26 PM. Always use your most recent med list.          aspirin 81 MG tablet Take 81 mg by mouth daily.   feeding supplement (ENSURE ENLIVE) Liqd Take 237 mLs by mouth 2 (two) times daily between meals.   finasteride 5 MG tablet Commonly known as:  PROSCAR Take 5 mg by mouth daily.   furosemide 20 MG tablet Commonly known as:  LASIX Take 10 mg by mouth daily. Take 0.5 mg (1/2 tablet)   levothyroxine 100 MCG tablet Commonly known as:  SYNTHROID, LEVOTHROID TAKE 1 TABLET BY MOUTH EVERY DAY   losartan 50 MG tablet  Commonly known as:  COZAAR TAKE 1/2 TABLET DAILY TO   STRENGTHEN HEART AND       CONTROL BLOOD PRESSURE   multivitamin tablet Take 1 tablet by mouth daily.   potassium chloride SA 20 MEQ tablet Commonly known as:  K-DUR,KLOR-CON Take 1 tablet (20 mEq total) by mouth daily.   PROBIOTIC ACIDOPHILUS PO Take by mouth daily.   Resveratrol 250 MG Caps Take 250 mg by mouth daily.   VITAMIN B COMPLEX PO Take 1 tablet by mouth daily.   vitamin C 500 MG tablet Commonly known as:  ASCORBIC ACID Take 500 mg by mouth daily.   Vitamin D 2000 units tablet Take 2,000 Units  by mouth daily.   vitamin E 400 UNIT capsule Generic drug:  vitamin E Take 400 Units by mouth daily.      The patient's daughter was present during today's visit.  Review of Systems:  Review of Systems  Constitutional: Negative for activity change, appetite change, chills, diaphoresis, fatigue and fever.  HENT: Positive for hearing loss. Negative for congestion and voice change.   Respiratory: Negative for cough, chest tightness, shortness of breath and wheezing.   Cardiovascular: Positive for leg swelling. Negative for chest pain and palpitations.  Gastrointestinal: Negative for abdominal distention, abdominal pain, constipation, diarrhea, nausea and vomiting.  Genitourinary: Negative for difficulty urinating, dysuria and urgency.  Musculoskeletal: Positive for arthralgias and gait problem. Negative for joint swelling.       Fell in garden backward when pulling on weeds.   Skin: Negative for color change and pallor.       A healing abrasion occiput.   Neurological: Positive for numbness. Negative for dizziness, speech difficulty, weakness, light-headedness and headaches.       Memory lapses.   Psychiatric/Behavioral: Negative for agitation, confusion, hallucinations and sleep disturbance. The patient is not nervous/anxious.     Health Maintenance  Topic Date Due  . INFLUENZA VACCINE  03/17/2018  . DEXA SCAN  05/15/2018  . TETANUS/TDAP  11/12/2027  . PNA vac Low Risk Adult  Completed    Physical Exam: Vitals:   04/07/18 1328  BP: (!) 142/62  Pulse: 72  Resp: 20  Temp: 98 F (36.7 C)  SpO2: 97%  Weight: 134 lb (60.8 kg)  Height: 5\' 5"  (1.651 m)   Body mass index is 22.3 kg/m. Physical Exam  Constitutional: He is oriented to person, place, and time. He appears well-developed and well-nourished.  HENT:  Head: Normocephalic and atraumatic.  Eyes: Pupils are equal, round, and reactive to light. EOM are normal.  Neck: Normal range of motion. Neck supple. No JVD present.  No thyromegaly present.  Cardiovascular: Normal rate and regular rhythm.  Murmur heard. Systolic murmur the right sternal border  Pulmonary/Chest: Effort normal and breath sounds normal. He has no wheezes. He has no rales.  Abdominal: Soft. Bowel sounds are normal. He exhibits no distension. There is no tenderness.  Musculoskeletal: He exhibits edema.  Trace edema BLE L>R, ambulates with cane  Neurological: He is alert and oriented to person, place, and time. No cranial nerve deficit. He exhibits normal muscle tone. Coordination normal.  Skin: Skin is warm and dry.  Occiput scalp abrasion is healing, no s/s of infection.   Psychiatric: He has a normal mood and affect. His behavior is normal. Judgment and thought content normal.    Labs reviewed: Basic Metabolic Panel: Recent Labs    07/29/17 0735 09/06/17 0000 10/18/17 0000 11/23/17 0755 12/14/17 0800 02/03/18 0700  NA 137  --   --   --  138 136  K 4.4  --   --   --  4.3 4.5  CL 104  --   --   --  105 104  CO2 29  --   --   --  28 28  GLUCOSE 98  --   --   --  91 85  BUN 24  --   --   --  28* 22  CREATININE 1.04  --   --   --  1.10 1.14*  CALCIUM 9.7  --   --   --  9.4 9.2  TSH  --  0.06* 0.30* 0.73  --   --    Liver Function Tests: Recent Labs    12/14/17 0800 02/03/18 0700  AST 23 22  ALT 13 12  BILITOT 0.5 0.5  PROT 6.0* 5.9*   No results for input(s): LIPASE, AMYLASE in the last 8760 hours. No results for input(s): AMMONIA in the last 8760 hours. CBC: Recent Labs    08/26/17 0700 12/14/17 0800  WBC 7.3 7.3  HGB 10.7* 11.0*  HCT 30.5* 32.4*  MCV 97.4 98.2  PLT 274 262   Lipid Panel: Recent Labs    09/06/17 0000  CHOL 150  HDL 56  LDLCALC 79  TRIG 73  CHOLHDL 2.7   No results found for: HGBA1C  Procedures since last visit: No results found.  Assessment/Plan  Memory deficit has gradual memory decline, he resides in [facility], still driving, gets around without lost, [place]   MMSE today showed 18/30, passed clock drawing. Discussed R vs B of memory preserving meds, the patient and his daughter declined treatment. Update CBC CMP TSH  Scalp abrasion Occiput, he sustained a abrasion at back of his head when he pulled on weeds in community garden, no focal neurological symptoms, no s/s of infection, healing nicely. Encourage safety precaution.   BPH (benign prostatic hyperplasia) Continue  Finasteride 5mg  qd, bathroom trips 0-1x/night.    Edema Peripheral edema, trace edema BLE L>R, continue Furosemide 10mg .  HTN (hypertension) Blood pressure is controlled, continue Losartan 50mg  qd po  Numbness of left hand Not disabling, doesn't interfere his night sleep, no difference in the nature of numbness regardless time of day. Will refer to PT for evaluation and treatment as indicated. Observe.    Labs/tests ordered: `CBC CMP TSH   Next appt:  6 months  Time spend 25 minutes.

## 2018-04-07 NOTE — Assessment & Plan Note (Signed)
Occiput, he sustained a abrasion at back of his head when he pulled on weeds in community garden, no focal neurological symptoms, no s/s of infection, healing nicely. Encourage safety precaution.

## 2018-04-07 NOTE — Assessment & Plan Note (Addendum)
has gradual memory decline, he resides in [facility], still driving, gets around without lost, [place]  MMSE today showed 18/30, passed clock drawing. Discussed R vs B of memory preserving meds, the patient and his daughter declined treatment. Update CBC CMP TSH

## 2018-04-07 NOTE — Assessment & Plan Note (Signed)
Not disabling, doesn't interfere his night sleep, no difference in the nature of numbness regardless time of day. Will refer to PT for evaluation and treatment as indicated. Observe.

## 2018-04-07 NOTE — Assessment & Plan Note (Signed)
Blood pressure is controlled, continue Losartan 50mg  qd po

## 2018-04-14 ENCOUNTER — Other Ambulatory Visit: Payer: Self-pay | Admitting: Nurse Practitioner

## 2018-04-14 DIAGNOSIS — R609 Edema, unspecified: Secondary | ICD-10-CM

## 2018-04-19 ENCOUNTER — Other Ambulatory Visit: Payer: Self-pay | Admitting: *Deleted

## 2018-04-19 DIAGNOSIS — E038 Other specified hypothyroidism: Secondary | ICD-10-CM

## 2018-04-19 DIAGNOSIS — I10 Essential (primary) hypertension: Secondary | ICD-10-CM

## 2018-04-19 MED ORDER — LOSARTAN POTASSIUM 50 MG PO TABS: ORAL_TABLET | ORAL | 0 refills | Status: DC

## 2018-04-19 MED ORDER — LEVOTHYROXINE SODIUM 100 MCG PO TABS: 100.0000 ug | ORAL_TABLET | Freq: Every day | ORAL | 0 refills | Status: DC

## 2018-05-03 ENCOUNTER — Other Ambulatory Visit: Payer: Self-pay | Admitting: Internal Medicine

## 2018-05-03 DIAGNOSIS — R609 Edema, unspecified: Secondary | ICD-10-CM

## 2018-05-17 LAB — COMPLETE METABOLIC PANEL WITH GFR
AG Ratio: 1.8 (calc) (ref 1.0–2.5)
ALT: 12 U/L (ref 9–46)
AST: 22 U/L (ref 10–35)
Albumin: 3.8 g/dL (ref 3.6–5.1)
Alkaline phosphatase (APISO): 58 U/L (ref 40–115)
BUN/Creatinine Ratio: 19 (calc) (ref 6–22)
BUN: 22 mg/dL (ref 7–25)
CO2: 28 mmol/L (ref 20–32)
Calcium: 9.2 mg/dL (ref 8.6–10.3)
Chloride: 104 mmol/L (ref 98–110)
Creat: 1.14 mg/dL — ABNORMAL HIGH (ref 0.70–1.11)
GFR, Est African American: 62 mL/min/{1.73_m2} (ref >=60)
GFR, Est Non African American: 54 mL/min/{1.73_m2} — ABNORMAL LOW (ref >=60)
Globulin: 2.1 g/dL (calc) (ref 1.9–3.7)
Glucose, Bld: 85 mg/dL (ref 65–99)
Potassium: 4.5 mmol/L (ref 3.5–5.3)
Sodium: 136 mmol/L (ref 135–146)
Total Bilirubin: 0.5 mg/dL (ref 0.2–1.2)
Total Protein: 5.9 g/dL — ABNORMAL LOW (ref 6.1–8.1)

## 2018-05-18 ENCOUNTER — Telehealth: Payer: Self-pay | Admitting: *Deleted

## 2018-05-18 NOTE — Telephone Encounter (Signed)
Patient called and stated that he received a email from [facility] stating there has been a recall on Losartan. Stated that he is taking Losartan and wants to know if he should discontinue. Please Advise.

## 2018-05-18 NOTE — Telephone Encounter (Signed)
Please advise the patient to check with his dispensing pharmacy for Losartan protocol. Thank you

## 2018-05-19 ENCOUNTER — Non-Acute Institutional Stay: Payer: Medicare Other | Admitting: Nurse Practitioner

## 2018-05-19 ENCOUNTER — Encounter: Payer: Self-pay | Admitting: Nurse Practitioner

## 2018-05-19 ENCOUNTER — Telehealth: Payer: Self-pay | Admitting: *Deleted

## 2018-05-19 DIAGNOSIS — L02229 Furuncle of trunk, unspecified: Secondary | ICD-10-CM

## 2018-05-19 DIAGNOSIS — R413 Other amnesia: Secondary | ICD-10-CM

## 2018-05-19 DIAGNOSIS — L02239 Carbuncle of trunk, unspecified: Secondary | ICD-10-CM | POA: Insufficient documentation

## 2018-05-19 MED ORDER — DOXYCYCLINE HYCLATE 100 MG PO TABS: 100.0000 mg | ORAL_TABLET | Freq: Two times a day (BID) | ORAL | 0 refills | Status: DC

## 2018-05-19 MED ORDER — MUPIROCIN CALCIUM 2 % EX CREA: 1.0000 "application " | TOPICAL_CREAM | Freq: Two times a day (BID) | CUTANEOUS | 0 refills | Status: DC

## 2018-05-19 NOTE — Telephone Encounter (Signed)
Patient notified and agreed. Stated he will call.

## 2018-05-19 NOTE — Patient Instructions (Addendum)
Left mid back carbuncle, warm shower 2 time a day, apply Bactroban oint bid until healed. Will cover the area with non adhesive dressing after each Bactroban oint applied. Will treat with  Doxycycline 100mg  qd bid x 10 days, f/u 2 weeks.

## 2018-05-19 NOTE — Progress Notes (Signed)
Location:  Clinic [facility]   Place of Service:   clinic [place] Provider: Marlana Latus NP  Code Status: DNR Goals of Care: [place] 04/07/2018  Does Patient Have a Medical Advance Directive? Yes  Type of Paramedic of [place];Living will  Does patient want to make changes to medical advance directive? No - Patient declined  Copy of [place] in Chart? Yes  Would patient like information on creating a medical advance directive? -  Pre-existing out of facility DNR order (yellow form or pink MOST form) -     Chief Complaint  Patient presents with  . Acute Visit    boil on back    HPI: Patient is a 84 y.o. male seen today for an acute visit for left mid back swelling, tenderness, purulence drainage, warmth, and redness. It was reported from PT [facility]. HPI was provided with assistance of staff due to his memory loss. Duration and onset are uncertain. He denied fever, malaise, nausea, vomiting, constipation, or diarrhea.   Past Medical History:  Diagnosis Date  . Acute posthemorrhagic anemia 12/23/2011  . Anemia 03/06/2015  . BPH (benign prostatic hyperplasia)   . Cardiomegaly 12/24/2011  . Degeneration of thoracic or thoracolumbar intervertebral disc 12/24/2011  . Edema 04/28/2012  . Fracture, intertrochanteric, left femur ([place]) 12/17/2011  . Hypertension   . Other abnormal blood chemistry 12/24/2011  . Senile osteoporosis 08/25/2012  . Spinal stenosis, lumbar region, without neurogenic claudication 12/24/2011  . Thyroid disease   . Unspecified hearing loss 12/24/2011  . Unspecified hypothyroidism 12/23/2011    Past Surgical History:  Procedure Laterality Date  . COMPRESSION HIP SCREW  12/17/2011   Procedure: COMPRESSION HIP;  Surgeon: Johnny Bridge, MD;  Location: [facility];  Service: Orthopedics;  Laterality: Left;  . FOOT SURGERY      Allergies  Allergen Reactions  . Clindamycin/Lincomycin     Developed C. dificile  . Horse-Derived Products      Horse serum  . Mold Extract [Trichophyton]     Allergies as of 05/19/2018      Reactions   Clindamycin/lincomycin    Developed C. dificile   Horse-derived Products    Horse serum   Mold Extract [trichophyton]       Medication List        Accurate as of 05/19/18 11:59 PM. Always use your most recent med list.          aspirin 81 MG tablet Take 81 mg by mouth daily.   doxycycline 100 MG tablet Commonly known as:  VIBRA-TABS Take 1 tablet (100 mg total) by mouth 2 (two) times daily.   feeding supplement (ENSURE ENLIVE) Liqd Take 237 mLs by mouth 2 (two) times daily between meals.   finasteride 5 MG tablet Commonly known as:  PROSCAR Take 5 mg by mouth daily.   furosemide 20 MG tablet Commonly known as:  LASIX Take 10 mg by mouth daily. Take 0.5 mg (1/2 tablet)   furosemide 20 MG tablet Commonly known as:  LASIX Take 0.5 tablets (10 mg total) by mouth daily.   levothyroxine 100 MCG tablet Commonly known as:  SYNTHROID, LEVOTHROID Take 1 tablet (100 mcg total) by mouth daily.   losartan 50 MG tablet Commonly known as:  COZAAR TAKE 1/2 TABLET DAILY TO   STRENGTHEN HEART AND       CONTROL BLOOD PRESSURE   multivitamin tablet Take 1 tablet by mouth daily.   mupirocin cream 2 % Commonly  known as:  BACTROBAN Apply 1 application topically 2 (two) times daily.   potassium chloride SA 20 MEQ tablet Commonly known as:  K-DUR,KLOR-CON TAKE 1 TABLET BY MOUTH  DAILY   PROBIOTIC ACIDOPHILUS PO Take by mouth daily.   Resveratrol 250 MG Caps Take 250 mg by mouth daily.   VITAMIN B COMPLEX PO Take 1 tablet by mouth daily.   vitamin C 500 MG tablet Commonly known as:  ASCORBIC ACID Take 500 mg by mouth daily.   Vitamin D 2000 units tablet Take 2,000 Units by mouth daily.   vitamin E 400 UNIT capsule Generic drug:  vitamin E Take 400 Units by mouth daily.       Review of Systems:  Review of Systems  Constitutional: Negative for activity change,  appetite change, chills, diaphoresis, fatigue and fever.  HENT: Positive for hearing loss. Negative for congestion and voice change.   Respiratory: Negative for cough, shortness of breath and wheezing.   Cardiovascular: Positive for leg swelling. Negative for chest pain.  Gastrointestinal: Negative for abdominal distention, abdominal pain, constipation, diarrhea, nausea and vomiting.  Genitourinary: Negative for difficulty urinating, dysuria and urgency.  Musculoskeletal: Positive for arthralgias and gait problem.  Skin: Positive for wound.       Left mid back skin redness, swelling, drainage, warmth, tenderness.   Neurological: Negative for dizziness, facial asymmetry, speech difficulty, weakness and headaches.       Memory lapses.   Psychiatric/Behavioral: Negative for agitation, behavioral problems, hallucinations and sleep disturbance. The patient is not nervous/anxious.     Health Maintenance  Topic Date Due  . DEXA SCAN  05/15/2018  . TETANUS/TDAP  11/12/2027  . INFLUENZA VACCINE  Completed  . PNA vac Low Risk Adult  Completed    Physical Exam: Vitals:   05/19/18 1352  BP: 118/60  Pulse: 77  Resp: 18  Temp: 98.2 F (36.8 C)  TempSrc: Oral  SpO2: 97%  Weight: 131 lb (59.4 kg)  Height: 5\' 5"  (1.651 m)   Body mass index is 21.8 kg/m. Physical Exam  Constitutional: He is oriented to person, place, and time. He appears well-developed and well-nourished.  HENT:  Head: Normocephalic and atraumatic.  Eyes: Pupils are equal, round, and reactive to light. EOM are normal.  Neck: Normal range of motion. Neck supple. No JVD present. No thyromegaly present.  Cardiovascular: Normal rate and regular rhythm.  Murmur heard. Pulmonary/Chest: He has no wheezes. He has no rales.  Abdominal: Soft. Bowel sounds are normal. He exhibits no distension. There is no rebound.  Musculoskeletal: He exhibits edema.  Trace edema BLE. Ambulates with walker.   Neurological: He is alert and  oriented to person, place, and time. No cranial nerve deficit. He exhibits normal muscle tone. Coordination normal.  Skin: There is erythema.  left mid back swelling, tenderness, purulence drainage, warmth, and redness, area is about a tennis ball size.   Psychiatric: He has a normal mood and affect. His behavior is normal. Judgment and thought content normal.    Labs reviewed: Basic Metabolic Panel: Recent Labs    07/29/17 0735 09/06/17 0000 10/18/17 0000 11/23/17 0755 12/14/17 0800 02/03/18 0700  NA 137  --   --   --  138 136  K 4.4  --   --   --  4.3 4.5  CL 104  --   --   --  105 104  CO2 29  --   --   --  28 28  GLUCOSE 98  --   --   --  91 85  BUN 24  --   --   --  28* 22  CREATININE 1.04  --   --   --  1.10 1.14*  CALCIUM 9.7  --   --   --  9.4 9.2  TSH  --  0.06* 0.30* 0.73  --   --    Liver Function Tests: Recent Labs    12/14/17 0800 02/03/18 0700  AST 23 22  ALT 13 12  BILITOT 0.5 0.5  PROT 6.0* 5.9*   No results for input(s): LIPASE, AMYLASE in the last 8760 hours. No results for input(s): AMMONIA in the last 8760 hours. CBC: Recent Labs    08/26/17 0700 12/14/17 0800  WBC 7.3 7.3  HGB 10.7* 11.0*  HCT 30.5* 32.4*  MCV 97.4 98.2  PLT 274 262   Lipid Panel: Recent Labs    09/06/17 0000  CHOL 150  HDL 56  LDLCALC 79  TRIG 73  CHOLHDL 2.7   No results found for: HGBA1C  Procedures since last visit: No results found.  Assessment/Plan Carbuncle and furuncle of trunk Lanced, drained, apply Bactroban oint bid until healed. Doxycycline 100mg  qd bid x 10 days.   Memory deficit Memory recall difficulties, HPI was provided with assistance of staff.     Labs/tests ordered: none  Next appt:  F/u 2 weeks with Dr. Sabra Heck.  Previous appointment/. 09/29/2018  Time spend 25 minutes.

## 2018-05-19 NOTE — Telephone Encounter (Signed)
Patient called regarding his losartan medications, he stated that he has questions regarding the medication and wants to talk to you.

## 2018-05-19 NOTE — Assessment & Plan Note (Signed)
Lanced, drained, apply Bactroban oint bid until healed. Doxycycline 100mg  qd bid x 10 days.

## 2018-05-19 NOTE — Telephone Encounter (Signed)
I will be glad to speak with him in the clinic [place] around 10 am Monday 05/23/18.

## 2018-05-23 ENCOUNTER — Encounter: Payer: Self-pay | Admitting: Nurse Practitioner

## 2018-05-23 NOTE — Assessment & Plan Note (Signed)
Memory recall difficulties, HPI was provided with assistance of staff.

## 2018-05-30 ENCOUNTER — Non-Acute Institutional Stay: Payer: Medicare Other | Admitting: Family Medicine

## 2018-05-30 ENCOUNTER — Encounter: Payer: Self-pay | Admitting: Family Medicine

## 2018-05-30 DIAGNOSIS — L723 Sebaceous cyst: Secondary | ICD-10-CM | POA: Diagnosis not present

## 2018-05-30 NOTE — Progress Notes (Signed)
Location: [place] of Service:  Clinic (12)  Provider:  Alain Honey  MD  Code Status: Full Code Goals of Care:  Advanced Directives 05/30/2018  Does Patient Have a Medical Advance Directive? Yes  Type of Paramedic of [place];Living will  Does patient want to make changes to medical advance directive? No - Patient declined  Copy of [employer] in Chart? Yes  Would patient like information on creating a medical advance directive? -  Pre-existing out of facility DNR order (yellow form or pink MOST form) -     Chief Complaint  Patient presents with  . Medical Management of Chronic Issues    2 week f/u -boil on (back)    HPI: Patient is a 84 y.o. male seen today for an acute visit for recheck infected sebaceous cyst on his back.  It was drained 1 week ago and he was placed on doxycycline instructions for dressing changes and hot shower to irrigate.  Been some problems with his being able to do the dressing changes.  He is accompanied today by his daughter who states that she noted a small amount of drainage on dressing that was changed earlier today.  He does have a history of frequent problems with cyst formation  Past Medical History:  Diagnosis Date  . Acute posthemorrhagic anemia 12/23/2011  . Anemia 03/06/2015  . BPH (benign prostatic hyperplasia)   . Cardiomegaly 12/24/2011  . Degeneration of thoracic or thoracolumbar intervertebral disc 12/24/2011  . Edema 04/28/2012  . Fracture, intertrochanteric, left femur ([place]) 12/17/2011  . Hypertension   . Other abnormal blood chemistry 12/24/2011  . Senile osteoporosis 08/25/2012  . Spinal stenosis, lumbar region, without neurogenic claudication 12/24/2011  . Thyroid disease   . Unspecified hearing loss 12/24/2011  . Unspecified hypothyroidism 12/23/2011    Past Surgical History:  Procedure Laterality Date  . COMPRESSION HIP SCREW  12/17/2011   Procedure: COMPRESSION HIP;   Surgeon: Johnny Bridge, MD;  Location: [facility];  Service: Orthopedics;  Laterality: Left;  . FOOT SURGERY      Allergies  Allergen Reactions  . Clindamycin/Lincomycin     Developed C. dificile  . Horse-Derived Products     Horse serum  . Mold Extract [Trichophyton]     Outpatient Encounter Medications as of 05/30/2018  Medication Sig  . aspirin 81 MG tablet Take 81 mg by mouth daily.  . B Complex Vitamins (VITAMIN B COMPLEX PO) Take 1 tablet by mouth daily.   . Cholecalciferol (VITAMIN D) 2000 UNITS tablet Take 2,000 Units by mouth daily.  [name] doxycycline (VIBRA-TABS) 100 MG tablet Take 1 tablet (100 mg total) by mouth 2 (two) times daily.  . feeding supplement, ENSURE ENLIVE, (ENSURE ENLIVE) LIQD Take 237 mLs by mouth 2 (two) times daily between meals.  . finasteride (PROSCAR) 5 MG tablet Take 5 mg by mouth daily.  . furosemide (LASIX) 20 MG tablet Take 10 mg by mouth daily. Take 0.5 mg (1/2 tablet)  . furosemide (LASIX) 20 MG tablet Take 0.5 tablets (10 mg total) by mouth daily.  . Lactobacillus (PROBIOTIC ACIDOPHILUS PO) Take by mouth daily.  [name] levothyroxine (SYNTHROID, LEVOTHROID) 100 MCG tablet Take 1 tablet (100 mcg total) by mouth daily.  [name] losartan (COZAAR) 50 MG tablet TAKE 1/2 TABLET DAILY TO   STRENGTHEN HEART AND       CONTROL BLOOD PRESSURE  . Multiple Vitamin (MULTIVITAMIN) tablet Take 1 tablet by mouth daily.  [name]  mupirocin cream (BACTROBAN) 2 % Apply 1 application topically 2 (two) times daily.  . potassium chloride SA (K-DUR,KLOR-CON) 20 MEQ tablet TAKE 1 TABLET BY MOUTH  DAILY  . Resveratrol 250 MG CAPS Take 250 mg by mouth daily.  . vitamin C (ASCORBIC ACID) 500 MG tablet Take 500 mg by mouth daily.  . vitamin E (VITAMIN E) 400 UNIT capsule Take 400 Units by mouth daily.   No facility-administered encounter medications on file as of 05/30/2018.     Review of Systems:  Review of Systems  Constitutional: Negative.   Eyes: Negative.   Respiratory: Negative.     Cardiovascular: Negative.   Gastrointestinal: Negative.   Skin: Positive for wound.  Psychiatric/Behavioral: Negative.     Health Maintenance  Topic Date Due  . DEXA SCAN  05/15/2018  . TETANUS/TDAP  11/12/2027  . INFLUENZA VACCINE  Completed  . PNA vac Low Risk Adult  Completed    Physical Exam: Vitals:   05/30/18 1307  BP: (!) 142/58  Pulse: 81  Resp: 20  Temp: 98.7 F (37.1 C)  SpO2: 97%  Weight: 131 lb (59.4 kg)  Height: 5\' 5"  (1.651 m)   Body mass index is 21.8 kg/m. Physical Exam  Constitutional: He appears well-developed and well-nourished.  Skin:  Cyst was drained last week in mid back left side is no longer draining to my exam and not able to express any pus.  I do believe a cyst is still palpable.  There are several other cysts his back he has seen dermatology before regarding this problem  Nursing note and vitals reviewed.   Labs reviewed: Basic Metabolic Panel: Recent Labs    07/29/17 0735 09/06/17 0000 10/18/17 0000 11/23/17 0755 12/14/17 0800 02/03/18 0700  NA 137  --   --   --  138 136  K 4.4  --   --   --  4.3 4.5  CL 104  --   --   --  105 104  CO2 29  --   --   --  28 28  GLUCOSE 98  --   --   --  91 85  BUN 24  --   --   --  28* 22  CREATININE 1.04  --   --   --  1.10 1.14*  CALCIUM 9.7  --   --   --  9.4 9.2  TSH  --  0.06* 0.30* 0.73  --   --    Liver Function Tests: Recent Labs    12/14/17 0800 02/03/18 0700  AST 23 22  ALT 13 12  BILITOT 0.5 0.5  PROT 6.0* 5.9*   No results for input(s): LIPASE, AMYLASE in the last 8760 hours. No results for input(s): AMMONIA in the last 8760 hours. CBC: Recent Labs    08/26/17 0700 12/14/17 0800  WBC 7.3 7.3  HGB 10.7* 11.0*  HCT 30.5* 32.4*  MCV 97.4 98.2  PLT 274 262   Lipid Panel: Recent Labs    09/06/17 0000  CHOL 150  HDL 56  LDLCALC 79  TRIG 73  CHOLHDL 2.7   No results found for: HGBA1C  Procedures since last visit: No results found.  Assessment/Plan 1.  Sebaceous cyst No further treatment at this time would recommend he follow-up with dermatology to see if other cyst should be removed   Labs/tests ordered:  @ORDERS @ Next appt:  09/29/2018 Lillette Boxer. Sabra Heck, [place] [address] [place], [place] Office  [phone]  

## 2018-05-31 ENCOUNTER — Telehealth: Payer: Self-pay | Admitting: *Deleted

## 2018-05-31 DIAGNOSIS — I10 Essential (primary) hypertension: Secondary | ICD-10-CM

## 2018-05-31 NOTE — Addendum Note (Signed)
Addended by: Wardell Honour on: 05/31/2018 09:32 PM   Modules accepted: Level of Service

## 2018-05-31 NOTE — Telephone Encounter (Signed)
He was to discontinue losartan after current dose exhausted and then begin irbesartan, 75 mg/day and have BP checked 2-3 times/week Call in #30 to start

## 2018-05-31 NOTE — Telephone Encounter (Signed)
Mr Zeien called regarding a conversation he had with Dr. Sabra Heck. He stated that Dr. Sabra Heck spoke of some medication and he didn't  receive the script before he left yesterday clinic appointment. Please Advise.

## 2018-06-01 MED ORDER — IRBESARTAN 75 MG PO TABS: 75.0000 mg | ORAL_TABLET | Freq: Every day | ORAL | 2 refills | Status: DC

## 2018-06-01 NOTE — Telephone Encounter (Addendum)
Spoke with patient regarding his medication, I explained to him that I am only sending 30 because we need to make sure this is going to work for him. He stated that he agrees with that and he will have his BP checked 2-3 times a week. Patient to keep a log of his BP taken.

## 2018-06-01 NOTE — Addendum Note (Signed)
Addended by: Eilene Ghazi on: 06/01/2018 08:38 AM   Modules accepted: Orders

## 2018-06-22 ENCOUNTER — Other Ambulatory Visit: Payer: Self-pay | Admitting: Nurse Practitioner

## 2018-06-22 DIAGNOSIS — E038 Other specified hypothyroidism: Secondary | ICD-10-CM

## 2018-08-08 ENCOUNTER — Other Ambulatory Visit: Payer: Self-pay | Admitting: Family Medicine

## 2018-08-08 DIAGNOSIS — I10 Essential (primary) hypertension: Secondary | ICD-10-CM

## 2018-08-09 ENCOUNTER — Other Ambulatory Visit: Payer: Self-pay | Admitting: *Deleted

## 2018-08-09 DIAGNOSIS — R609 Edema, unspecified: Secondary | ICD-10-CM

## 2018-08-09 MED ORDER — FUROSEMIDE 20 MG PO TABS: 10.0000 mg | ORAL_TABLET | Freq: Every day | ORAL | 1 refills | Status: DC

## 2018-08-09 MED ORDER — POTASSIUM CHLORIDE CRYS ER 20 MEQ PO TBCR: 20.0000 meq | EXTENDED_RELEASE_TABLET | Freq: Every day | ORAL | 1 refills | Status: DC

## 2018-08-09 NOTE — Telephone Encounter (Signed)
[facility] 

## 2018-09-07 ENCOUNTER — Other Ambulatory Visit: Payer: Self-pay | Admitting: Family Medicine

## 2018-09-07 DIAGNOSIS — I10 Essential (primary) hypertension: Secondary | ICD-10-CM

## 2018-09-12 ENCOUNTER — Other Ambulatory Visit: Payer: Self-pay | Admitting: *Deleted

## 2018-09-12 DIAGNOSIS — I10 Essential (primary) hypertension: Secondary | ICD-10-CM

## 2018-09-12 MED ORDER — IRBESARTAN 75 MG PO TABS: 75.0000 mg | ORAL_TABLET | Freq: Every day | ORAL | 1 refills | Status: DC

## 2018-09-12 NOTE — Telephone Encounter (Signed)
[facility] 

## 2018-09-14 ENCOUNTER — Telehealth: Payer: Self-pay | Admitting: *Deleted

## 2018-09-14 NOTE — Telephone Encounter (Signed)
Received fax from [place] stating the following medication is currently out of Stock IRBESARTAN 75mg . Due to ongoing nationwide impact of the angiotensin receptor blockers recalls, experiencing supply shortages withnin this class of antihypertensive medications.  Losartan 50mg , 100mg  and HCTZ 12.5 and 25mg  are currently available. Two separate prescriptions will be required to make this switch. Also has all angiotensin converting enzyme inhibitors and combos in stock.   Please Advise.   [facility] [phone] Fax: [phone] Order #:[id number]

## 2018-09-29 DIAGNOSIS — Z0000 Encounter for general adult medical examination without abnormal findings: Secondary | ICD-10-CM

## 2018-09-30 LAB — COMPLETE METABOLIC PANEL WITH GFR
AG Ratio: 1.5 (calc) (ref 1.0–2.5)
ALT: 10 U/L (ref 9–46)
AST: 17 U/L (ref 10–35)
Albumin: 3.4 g/dL — ABNORMAL LOW (ref 3.6–5.1)
Alkaline phosphatase (APISO): 58 U/L (ref 35–144)
BUN/Creatinine Ratio: 25 (calc) — ABNORMAL HIGH (ref 6–22)
BUN: 28 mg/dL — ABNORMAL HIGH (ref 7–25)
CO2: 26 mmol/L (ref 20–32)
Calcium: 9.4 mg/dL (ref 8.6–10.3)
Chloride: 105 mmol/L (ref 98–110)
Creat: 1.14 mg/dL — ABNORMAL HIGH (ref 0.70–1.11)
GFR, Est African American: 62 mL/min/{1.73_m2} (ref >=60)
GFR, Est Non African American: 53 mL/min/{1.73_m2} — ABNORMAL LOW (ref >=60)
Globulin: 2.2 g/dL (calc) (ref 1.9–3.7)
Glucose, Bld: 88 mg/dL (ref 65–99)
Potassium: 4.3 mmol/L (ref 3.5–5.3)
Sodium: 139 mmol/L (ref 135–146)
Total Bilirubin: 0.4 mg/dL (ref 0.2–1.2)
Total Protein: 5.6 g/dL — ABNORMAL LOW (ref 6.1–8.1)

## 2018-09-30 LAB — CBC
HCT: 30.2 % — ABNORMAL LOW (ref 38.5–50.0)
Hemoglobin: 10.4 g/dL — ABNORMAL LOW (ref 13.2–17.1)
MCH: 35.5 pg — ABNORMAL HIGH (ref 27.0–33.0)
MCHC: 34.4 g/dL (ref 32.0–36.0)
MCV: 103.1 fL — ABNORMAL HIGH (ref 80.0–100.0)
MPV: 10.1 fL (ref 7.5–12.5)
Platelets: 272 10*3/uL (ref 140–400)
RBC: 2.93 10*6/uL — ABNORMAL LOW (ref 4.20–5.80)
RDW: 12.3 % (ref 11.0–15.0)
WBC: 8.5 10*3/uL (ref 3.8–10.8)

## 2018-09-30 LAB — TSH: TSH: 4.99 mIU/L — ABNORMAL HIGH (ref 0.40–4.50)

## 2018-10-06 ENCOUNTER — Non-Acute Institutional Stay: Payer: Medicare Other | Admitting: Nurse Practitioner

## 2018-10-06 ENCOUNTER — Encounter: Payer: Self-pay | Admitting: Nurse Practitioner

## 2018-10-06 DIAGNOSIS — L723 Sebaceous cyst: Secondary | ICD-10-CM

## 2018-10-06 DIAGNOSIS — I10 Essential (primary) hypertension: Secondary | ICD-10-CM

## 2018-10-06 DIAGNOSIS — E039 Hypothyroidism, unspecified: Secondary | ICD-10-CM | POA: Diagnosis not present

## 2018-10-06 DIAGNOSIS — D649 Anemia, unspecified: Secondary | ICD-10-CM

## 2018-10-06 DIAGNOSIS — R609 Edema, unspecified: Secondary | ICD-10-CM

## 2018-10-06 DIAGNOSIS — N400 Enlarged prostate without lower urinary tract symptoms: Secondary | ICD-10-CM

## 2018-10-06 NOTE — Progress Notes (Signed)
Location:   clinic [place]   Place of Service:  Clinic (12) Provider: Marlana Latus NP  Code Status: DNR Goals of Care: [place] Advanced Directives 05/30/2018  Does Patient Have a Medical Advance Directive? Yes  Type of Paramedic of [place];Living will  Does patient want to make changes to medical advance directive? No - Patient declined  Copy of [place] in Chart? Yes  Would patient like information on creating a medical advance directive? -  Pre-existing out of facility DNR order (yellow form or pink MOST form) -     Chief Complaint  Patient presents with  . Medical Management of Chronic Issues    6 mo f/u- C/o-cough,nasal drip     HPI: Patient is a 85 y.o. male seen today for medical management of chronic diseases.     The patient has history of Hypothyroidism, on Levothyroxine 130mcg qd, last TSH 4.99 09/29/18, trending up from 0.73 11/23/17, scheduled TSH in 3 months. HTN, blood pressure is controlled on Losartan 50mg  qd. Edema BLE, chronic, no apparent change, on Furosemide 20mg  qd. BPH, no urinary retention, on Finasteride 5mg  qd, Tamsulosin 0.4mg  qd.  Past Medical History:  Diagnosis Date  . Acute posthemorrhagic anemia 12/23/2011  . Anemia 03/06/2015  . BPH (benign prostatic hyperplasia)   . Cardiomegaly 12/24/2011  . Degeneration of thoracic or thoracolumbar intervertebral disc 12/24/2011  . Edema 04/28/2012  . Fracture, intertrochanteric, left femur ([place]) 12/17/2011  . Hypertension   . Other abnormal blood chemistry 12/24/2011  . Senile osteoporosis 08/25/2012  . Spinal stenosis, lumbar region, without neurogenic claudication 12/24/2011  . Thyroid disease   . Unspecified hearing loss 12/24/2011  . Unspecified hypothyroidism 12/23/2011    Past Surgical History:  Procedure Laterality Date  . COMPRESSION HIP SCREW  12/17/2011   Procedure: COMPRESSION HIP;  Surgeon: Johnny Bridge, MD;  Location: [facility];  Service: Orthopedics;  Laterality: Left;    . FOOT SURGERY      Allergies  Allergen Reactions  . Clindamycin/Lincomycin     Developed C. dificile  . Horse-Derived Products     Horse serum  . Mold Extract [Trichophyton]     Allergies as of 10/06/2018      Reactions   Clindamycin/lincomycin    Developed C. dificile   Horse-derived Products    Horse serum   Mold Extract [trichophyton]       Medication List       Accurate as of October 06, 2018 11:59 PM. Always use your most recent med list.        aspirin 81 MG tablet Take 81 mg by mouth daily.   feeding supplement (ENSURE ENLIVE) Liqd Take 237 mLs by mouth 2 (two) times daily between meals.   finasteride 5 MG tablet Commonly known as:  PROSCAR Take 5 mg by mouth daily.   furosemide 20 MG tablet Commonly known as:  LASIX Take 0.5 tablets (10 mg total) by mouth daily.   levothyroxine 100 MCG tablet Commonly known as:  SYNTHROID, LEVOTHROID TAKE 1 TABLET BY MOUTH EVERY DAY   losartan 50 MG tablet Commonly known as:  COZAAR TAKE 1/2 TABLET DAILY TO   STRENGTHEN HEART AND       CONTROL BLOOD PRESSURE   multivitamin tablet Take 1 tablet by mouth daily.   potassium chloride SA 20 MEQ tablet Commonly known as:  K-DUR,KLOR-CON Take 1 tablet (20 mEq total) by mouth daily.   PROBIOTIC ACIDOPHILUS PO Take by mouth daily.  Resveratrol 250 MG Caps Take 250 mg by mouth daily.   tamsulosin 0.4 MG Caps capsule Commonly known as:  FLOMAX Take 1 capsule by mouth daily.   vitamin C 500 MG tablet Commonly known as:  ASCORBIC ACID Take 500 mg by mouth daily.   VITAMIN D3 MAXIMUM STRENGTH 125 MCG (5000 UT) capsule Generic drug:  Cholecalciferol Take 5,000 Units by mouth daily.   vitamin E 400 UNIT capsule Generic drug:  vitamin E Take 400 Units by mouth daily.       Review of Systems:  Review of Systems  Constitutional: Negative for activity change, appetite change, chills, diaphoresis, fatigue and fever.  HENT: Positive for hearing loss. Negative  for congestion and voice change.   Respiratory: Negative for cough, shortness of breath and wheezing.   Cardiovascular: Positive for leg swelling. Negative for chest pain.  Gastrointestinal: Negative for abdominal pain, constipation, nausea and vomiting.  Genitourinary: Positive for frequency. Negative for difficulty urinating, dysuria and urgency.  Musculoskeletal: Positive for arthralgias, back pain and gait problem.       Chronic lower back discomfort which is positional, livable.   Skin: Negative for color change.  Neurological: Negative for dizziness, speech difficulty and headaches.       Memory lapses.   Psychiatric/Behavioral: Negative for agitation, behavioral problems, hallucinations and sleep disturbance. The patient is not nervous/anxious.     Health Maintenance  Topic Date Due  . DEXA SCAN  05/15/2018  . TETANUS/TDAP  11/12/2027  . INFLUENZA VACCINE  Completed  . PNA vac Low Risk Adult  Completed    Physical Exam: Vitals:   10/06/18 1336  BP: (!) 142/78  Pulse: 74  Resp: 20  Temp: 98.5 F (36.9 C)  TempSrc: Oral  SpO2: 95%  Weight: 138 lb 9.6 oz (62.9 kg)  Height: 5\' 5"  (1.651 m)   Body mass index is 23.06 kg/m. Physical Exam Constitutional:      General: He is not in acute distress.    Appearance: Normal appearance. He is not ill-appearing, toxic-appearing or diaphoretic.  HENT:     Head: Normocephalic and atraumatic.     Nose: Nose normal.     Mouth/Throat:     Mouth: Mucous membranes are moist.  Eyes:     Extraocular Movements: Extraocular movements intact.     Pupils: Pupils are equal, round, and reactive to light.  Neck:     Musculoskeletal: Normal range of motion and neck supple.  Cardiovascular:     Rate and Rhythm: Normal rate and regular rhythm.     Heart sounds: Murmur present.  Pulmonary:     Effort: Pulmonary effort is normal.     Breath sounds: No wheezing, rhonchi or rales.  Abdominal:     General: There is no distension.      Palpations: Abdomen is soft.     Tenderness: There is no abdominal tenderness. There is no guarding.     Hernia: No hernia is present.  Musculoskeletal:     Right lower leg: Edema present.     Left lower leg: Edema present.     Comments: Trace edema BLE. Ambulates with walker.   Skin:    General: Skin is warm and dry.     Comments: Mid thoracic spine sebaceous cyst about a marble size, no s/s of infection.   Neurological:     General: No focal deficit present.     Mental Status: He is alert. Mental status is at baseline.     Cranial  Nerves: No cranial nerve deficit.     Motor: No weakness.     Coordination: Coordination normal.     Gait: Gait abnormal.     Comments: Oriented to person and place.   Psychiatric:        Mood and Affect: Mood normal.        Behavior: Behavior normal.        Thought Content: Thought content normal.        Judgment: Judgment normal.     Labs reviewed: Basic Metabolic Panel: Recent Labs    10/18/17 0000 11/23/17 0755 12/14/17 0800 02/03/18 0700 09/28/18 0000  NA  --   --  138 136 139  K  --   --  4.3 4.5 4.3  CL  --   --  105 104 105  CO2  --   --  28 28 26   GLUCOSE  --   --  91 85 88  BUN  --   --  28* 22 28*  CREATININE  --   --  1.10 1.14* 1.14*  CALCIUM  --   --  9.4 9.2 9.4  TSH 0.30* 0.73  --   --  4.99*   Liver Function Tests: Recent Labs    12/14/17 0800 02/03/18 0700 09/28/18 0000  AST 23 22 17   ALT 13 12 10   BILITOT 0.5 0.5 0.4  PROT 6.0* 5.9* 5.6*   No results for input(s): LIPASE, AMYLASE in the last 8760 hours. No results for input(s): AMMONIA in the last 8760 hours. CBC: Recent Labs    12/14/17 0800 09/28/18 0000  WBC 7.3 8.5  HGB 11.0* 10.4*  HCT 32.4* 30.2*  MCV 98.2 103.1*  PLT 262 272   Lipid Panel: No results for input(s): CHOL, HDL, LDLCALC, TRIG, CHOLHDL, LDLDIRECT in the last 8760 hours. No results found for: HGBA1C  Procedures since last visit: No results  found.  Assessment/Plan  Hypothyroidism 09/29/18 TSH 4.99, trending up, repeat TSH 3 months. Continue Levothyroxine 127mcg qd.   HTN (hypertension) Blood pressure is controlled, continue Losartan 50mg  qd, Irbesartan 75mg  qd.   BPH (benign prostatic hyperplasia) Stable, no urinary retention, underwent urology evaluation in the past, continue Finasteride 5mg  po qd.   Edema Chronic, remains no change, continue Furosemide 10mg  qd.   Anemia 09/29/18 wbc 8.5, Hgb 10.4, plt 272, will obtain CBC Vit B12, Folate, Iron, Fe sat, ferritin 3 months.    Sebaceous cyst Mid thoracic spine sebaceous cyst about a marble size, no s/s of infection.     Labs/tests ordered:  TSH, CBC Vit B12, Folate, Iron, Fe sat, ferritin prior to the next appointment   Next appt:  3 months

## 2018-10-06 NOTE — Assessment & Plan Note (Signed)
09/29/18 TSH 4.99, trending up, repeat TSH 3 months. Continue Levothyroxine 185mcg qd.

## 2018-10-06 NOTE — Assessment & Plan Note (Signed)
09/29/18 wbc 8.5, Hgb 10.4, plt 272, will obtain CBC Vit B12, Folate, Iron, Fe sat, ferritin 3 months.

## 2018-10-06 NOTE — Assessment & Plan Note (Signed)
Chronic, remains no change, continue Furosemide 10mg  qd.

## 2018-10-06 NOTE — Patient Instructions (Addendum)
TSH CBC Vit B12, Folate, Iron, Fe sat, ferritin 3 months prior to the next appointment. Next appt:  3 months

## 2018-10-06 NOTE — Assessment & Plan Note (Signed)
Mid thoracic spine sebaceous cyst about a marble size, no s/s of infection.

## 2018-10-06 NOTE — Assessment & Plan Note (Signed)
Blood pressure is controlled, continue Losartan 50mg  qd, Irbesartan 75mg  qd.

## 2018-10-06 NOTE — Assessment & Plan Note (Signed)
Stable, no urinary retention, underwent urology evaluation in the past, continue Finasteride 5mg  po qd.

## 2018-10-25 ENCOUNTER — Other Ambulatory Visit: Payer: Self-pay | Admitting: *Deleted

## 2018-10-25 DIAGNOSIS — E038 Other specified hypothyroidism: Secondary | ICD-10-CM

## 2018-10-25 MED ORDER — LEVOTHYROXINE SODIUM 100 MCG PO TABS: 100.0000 ug | ORAL_TABLET | Freq: Every day | ORAL | 1 refills | Status: DC

## 2018-11-10 ENCOUNTER — Encounter: Payer: Self-pay | Admitting: *Deleted

## 2019-01-02 ENCOUNTER — Other Ambulatory Visit: Payer: Self-pay

## 2019-01-02 DIAGNOSIS — D649 Anemia, unspecified: Secondary | ICD-10-CM

## 2019-01-03 ENCOUNTER — Other Ambulatory Visit: Payer: Medicare Other

## 2019-01-03 ENCOUNTER — Other Ambulatory Visit: Payer: Self-pay

## 2019-01-04 ENCOUNTER — Other Ambulatory Visit: Payer: Self-pay | Admitting: Family Medicine

## 2019-01-04 DIAGNOSIS — R609 Edema, unspecified: Secondary | ICD-10-CM

## 2019-01-05 ENCOUNTER — Encounter: Payer: Self-pay | Admitting: Nurse Practitioner

## 2019-01-05 ENCOUNTER — Non-Acute Institutional Stay: Payer: Medicare Other | Admitting: Nurse Practitioner

## 2019-01-05 ENCOUNTER — Other Ambulatory Visit: Payer: Self-pay

## 2019-01-05 DIAGNOSIS — I10 Essential (primary) hypertension: Secondary | ICD-10-CM | POA: Diagnosis not present

## 2019-01-05 DIAGNOSIS — N400 Enlarged prostate without lower urinary tract symptoms: Secondary | ICD-10-CM

## 2019-01-05 DIAGNOSIS — E039 Hypothyroidism, unspecified: Secondary | ICD-10-CM

## 2019-01-05 DIAGNOSIS — I739 Peripheral vascular disease, unspecified: Secondary | ICD-10-CM | POA: Diagnosis not present

## 2019-01-05 NOTE — Assessment & Plan Note (Signed)
No urinary retention, continue Finasteride 5mg qd, Tamsulosin 0.4mg qd.  

## 2019-01-05 NOTE — Assessment & Plan Note (Signed)
Blood pressure is controlled, continue Losartan 50mg  qd.

## 2019-01-05 NOTE — Assessment & Plan Note (Signed)
Chronic, no open wounds, stable, continue Furosmeide 20mg  qd.

## 2019-01-05 NOTE — Patient Instructions (Addendum)
Patient desires to f/u in clinic [place] as needed.

## 2019-01-05 NOTE — Progress Notes (Addendum)
Location:   clinic [place]   Place of Service:  Clinic (12) Provider: Marlana Latus NP  Code Status: DNR Goals of Care: [place] 01/05/2019  Does Patient Have a Medical Advance Directive? Yes  Type of Paramedic of [place];Living will  Does patient want to make changes to medical advance directive? No - Patient declined  Copy of [place] in Chart? Yes - validated most recent copy scanned in chart (See row information)  Would patient like information on creating a medical advance directive? -  Pre-existing out of facility DNR order (yellow form or pink MOST form) -     Chief Complaint  Patient presents with  . Medical Management of Chronic Issues    3 month follow up  . Health Maintenance    Dexa Scan     HPI: Patient is a 85 y.o. male seen today for medical management of chronic diseases.     The patient has history of BPH, no urinary retention, on Tamsulosin 0.4mg  qd, Finasteride 5mg  qd. HTN, blood pressure is controlled on Losartan 50mg  qd. Hypothyroidism, on Levothyroxine 14mcg qd, last TSH. Peripheral edema, chronic, remains no change, on Furosemide 20mg  qd.   Past Medical History:  Diagnosis Date  . Acute posthemorrhagic anemia 12/23/2011  . Anemia 03/06/2015  . BPH (benign prostatic hyperplasia)   . Cardiomegaly 12/24/2011  . Degeneration of thoracic or thoracolumbar intervertebral disc 12/24/2011  . Edema 04/28/2012  . Fracture, intertrochanteric, left femur ([place]) 12/17/2011  . Hypertension   . Other abnormal blood chemistry 12/24/2011  . Senile osteoporosis 08/25/2012  . Spinal stenosis, lumbar region, without neurogenic claudication 12/24/2011  . Thyroid disease   . Unspecified hearing loss 12/24/2011  . Unspecified hypothyroidism 12/23/2011    Past Surgical History:  Procedure Laterality Date  . COMPRESSION HIP SCREW  12/17/2011   Procedure: COMPRESSION HIP;  Surgeon: Johnny Bridge, MD;  Location: [facility];  Service:  Orthopedics;  Laterality: Left;  . FOOT SURGERY      Allergies  Allergen Reactions  . Clindamycin/Lincomycin     Developed C. dificile  . Horse-Derived Products     Horse serum  . Mold Extract [Trichophyton]     Allergies as of 01/05/2019      Reactions   Clindamycin/lincomycin    Developed C. dificile   Horse-derived Products    Horse serum   Mold Extract [trichophyton]       Medication List       Accurate as of Jan 05, 2019 11:59 PM. If you have any questions, ask your nurse or doctor.        aspirin 81 MG tablet Take 81 mg by mouth daily.   CALCIUM-MAGNESIUM-VITAMIN D PO Take by mouth.   feeding supplement (ENSURE ENLIVE) Liqd Take 237 mLs by mouth 2 (two) times daily between meals. What changed:    when to take this  additional instructions   finasteride 5 MG tablet Commonly known as:  PROSCAR Take 5 mg by mouth daily.   furosemide 20 MG tablet Commonly known as:  LASIX Take 0.5 tablets (10 mg total) by mouth daily.   levothyroxine 100 MCG tablet Commonly known as:  SYNTHROID Take 1 tablet (100 mcg total) by mouth daily.   losartan 50 MG tablet Commonly known as:  COZAAR TAKE 1/2 TABLET DAILY TO   STRENGTHEN HEART AND       CONTROL BLOOD PRESSURE   multivitamin tablet Take 1 tablet by mouth daily.  potassium chloride SA 20 MEQ tablet Commonly known as:  K-DUR Take 1 tablet (20 mEq total) by mouth daily.   PROBIOTIC ACIDOPHILUS PO Take by mouth daily.   Resveratrol 250 MG Caps Take 250 mg by mouth daily.   tamsulosin 0.4 MG Caps capsule Commonly known as:  FLOMAX Take 1 capsule by mouth daily.   vitamin C 500 MG tablet Commonly known as:  ASCORBIC ACID Take 500 mg by mouth daily.   Vitamin D3 Maximum Strength 125 MCG (5000 UT) capsule Generic drug:  Cholecalciferol Take 5,000 Units by mouth daily.   vitamin E 400 UNIT capsule Generic drug:  vitamin E Take 400 Units by mouth daily.       Review of Systems:  Review of  Systems  Constitutional: Negative for activity change, appetite change, chills, diaphoresis, fatigue, fever and unexpected weight change.  HENT: Positive for hearing loss. Negative for congestion and voice change.   Eyes: Negative for visual disturbance.  Respiratory: Negative for cough, shortness of breath and wheezing.   Cardiovascular: Positive for leg swelling.  Gastrointestinal: Negative for abdominal distention, abdominal pain, constipation, diarrhea, nausea and vomiting.  Genitourinary: Positive for frequency. Negative for difficulty urinating, dysuria and urgency.  Musculoskeletal: Positive for arthralgias, back pain and gait problem.  Skin: Negative for color change and pallor.  Neurological: Negative for dizziness, speech difficulty, weakness and headaches.  Psychiatric/Behavioral: Negative for agitation, behavioral problems, hallucinations and sleep disturbance. The patient is not nervous/anxious.     Health Maintenance  Topic Date Due  . DEXA SCAN  05/15/2018  . INFLUENZA VACCINE  03/18/2019  . TETANUS/TDAP  11/12/2027  . PNA vac Low Risk Adult  Completed    Physical Exam: Vitals:   01/05/19 1426  BP: (!) 118/54  Pulse: 70  Temp: 98.6 F (37 C)  TempSrc: Oral  SpO2: 96%  Weight: 133 lb (60.3 kg)  Height: 5\' 5"  (1.651 m)   Body mass index is 22.13 kg/m. Physical Exam Vitals signs reviewed.  Constitutional:      General: He is not in acute distress.    Appearance: He is not ill-appearing, toxic-appearing or diaphoretic.  HENT:     Head: Normocephalic and atraumatic.     Nose: Nose normal.     Mouth/Throat:     Mouth: Mucous membranes are moist.  Eyes:     Extraocular Movements: Extraocular movements intact.     Conjunctiva/sclera: Conjunctivae normal.     Pupils: Pupils are equal, round, and reactive to light.  Neck:     Vascular: No carotid bruit.  Cardiovascular:     Rate and Rhythm: Normal rate and regular rhythm.     Pulses:          Dorsalis pedis  pulses are 1+ on the right side and 1+ on the left side.     Heart sounds: No murmur.  Pulmonary:     Breath sounds: No wheezing, rhonchi or rales.  Abdominal:     General: There is no distension.     Palpations: Abdomen is soft.     Tenderness: There is no abdominal tenderness. There is no right CVA tenderness, left CVA tenderness, guarding or rebound.  Musculoskeletal:     Right lower leg: Edema present.     Left lower leg: Edema present.     Comments: Trace edema BLE. Ambulate with walker.   Lymphadenopathy:     Cervical: No cervical adenopathy.  Skin:    General: Skin is warm and dry.  Neurological:  General: No focal deficit present.     Mental Status: He is alert. Mental status is at baseline.     Cranial Nerves: No cranial nerve deficit.     Motor: No weakness.     Coordination: Coordination normal.     Gait: Gait abnormal.     Comments: Ambulates with walker.   Psychiatric:        Mood and Affect: Mood normal.        Behavior: Behavior normal.        Thought Content: Thought content normal.        Judgment: Judgment normal.     Labs reviewed: Basic Metabolic Panel: Recent Labs    02/03/18 0700 09/28/18 0000  NA 136 139  K 4.5 4.3  CL 104 105  CO2 28 26  GLUCOSE 85 88  BUN 22 28*  CREATININE 1.14* 1.14*  CALCIUM 9.2 9.4  TSH  --  4.99*   Liver Function Tests: Recent Labs    02/03/18 0700 09/28/18 0000  AST 22 17  ALT 12 10  BILITOT 0.5 0.4  PROT 5.9* 5.6*   No results for input(s): LIPASE, AMYLASE in the last 8760 hours. No results for input(s): AMMONIA in the last 8760 hours. CBC: Recent Labs    09/28/18 0000  WBC 8.5  HGB 10.4*  HCT 30.2*  MCV 103.1*  PLT 272   Lipid Panel: No results for input(s): CHOL, HDL, LDLCALC, TRIG, CHOLHDL, LDLDIRECT in the last 8760 hours. No results found for: HGBA1C  Procedures since last visit: No results found.  Assessment/Plan  HTN (hypertension) Blood pressure is controlled, continue Losartan  50mg  qd.   PVD (peripheral vascular disease) (HCC) Chronic, no open wounds, stable, continue Furosmeide 20mg  qd.   Hypothyroidism Stable, continue Levothyroxine 142mcg qd.   BPH (benign prostatic hyperplasia) No urinary retention, continue Finasteride 5mg  qd, Tamsulosin 0.4mg  qd.    Labs/tests ordered: none  Next appt: as needed per patient's preference.

## 2019-01-05 NOTE — Assessment & Plan Note (Signed)
Stable, continue Levothyroxine 122mcg qd.

## 2019-01-06 NOTE — Addendum Note (Signed)
Addended by: Shantelle Alles X on: 01/06/2019 01:03 PM   Modules accepted: Level of Service

## 2019-02-04 ENCOUNTER — Other Ambulatory Visit: Payer: Self-pay | Admitting: Family Medicine

## 2019-02-04 DIAGNOSIS — R609 Edema, unspecified: Secondary | ICD-10-CM

## 2019-02-20 ENCOUNTER — Other Ambulatory Visit: Payer: Self-pay | Admitting: Family Medicine

## 2019-02-20 DIAGNOSIS — R609 Edema, unspecified: Secondary | ICD-10-CM

## 2019-02-23 ENCOUNTER — Other Ambulatory Visit: Payer: Self-pay | Admitting: *Deleted

## 2019-02-23 DIAGNOSIS — R609 Edema, unspecified: Secondary | ICD-10-CM

## 2019-02-23 MED ORDER — FUROSEMIDE 20 MG PO TABS: 10.0000 mg | ORAL_TABLET | Freq: Every day | ORAL | 1 refills | Status: DC

## 2019-02-23 NOTE — Telephone Encounter (Signed)
[facility] 

## 2019-03-07 ENCOUNTER — Other Ambulatory Visit: Payer: Self-pay | Admitting: Internal Medicine

## 2019-03-07 DIAGNOSIS — I10 Essential (primary) hypertension: Secondary | ICD-10-CM

## 2019-03-07 NOTE — Telephone Encounter (Signed)
Requested medication Avapro is not on patient's current medication list

## 2019-03-08 ENCOUNTER — Other Ambulatory Visit: Payer: Self-pay | Admitting: Nurse Practitioner

## 2019-03-08 DIAGNOSIS — E038 Other specified hypothyroidism: Secondary | ICD-10-CM

## 2019-03-13 ENCOUNTER — Telehealth: Payer: Self-pay | Admitting: *Deleted

## 2019-03-13 NOTE — Telephone Encounter (Signed)
Called patient left message to schedule for AWV.

## 2019-03-15 ENCOUNTER — Other Ambulatory Visit: Payer: Self-pay | Admitting: Nurse Practitioner

## 2019-03-15 DIAGNOSIS — E038 Other specified hypothyroidism: Secondary | ICD-10-CM

## 2019-03-30 ENCOUNTER — Other Ambulatory Visit: Payer: Self-pay

## 2019-03-30 ENCOUNTER — Encounter: Payer: Self-pay | Admitting: Nurse Practitioner

## 2019-03-30 ENCOUNTER — Non-Acute Institutional Stay: Payer: Medicare Other | Admitting: Nurse Practitioner

## 2019-03-30 VITALS — BP 120/58 | HR 73 | Temp 98.2°F | Resp 18 | Ht 65.0 in | Wt 133.0 lb

## 2019-03-30 DIAGNOSIS — I10 Essential (primary) hypertension: Secondary | ICD-10-CM

## 2019-03-30 DIAGNOSIS — Z0000 Encounter for general adult medical examination without abnormal findings: Secondary | ICD-10-CM

## 2019-03-30 NOTE — Progress Notes (Addendum)
Subjective:   Cory Burton is a 85 y.o. male who presents for Medicare Annual/Subsequent preventive examination at [facility] clinic.         Objective:    Vitals: BP (!) 120/58   Pulse 73   Temp 98.2 F (36.8 C)   Resp 18   Ht 5\' 5"  (1.651 m)   Wt 133 lb (60.3 kg)   SpO2 96%   BMI 22.13 kg/m   Body mass index is 22.13 kg/m.  Advanced Directives 01/05/2019 05/30/2018 04/07/2018 08/05/2017 07/15/2017 04/15/2017 02/04/2017  Does Patient Have a Medical Advance Directive? Yes Yes Yes Yes Yes Yes Yes  Type of Paramedic of [place];Living will [place];Living will [place];Living will [place];Living will [place];Living will [place];Living will [place];Living will  Does patient want to make changes to medical advance directive? No - Patient declined No - Patient declined No - Patient declined No - Patient declined No - Patient declined No - Patient declined No - Patient declined  Copy of [place] in Chart? Yes - validated most recent copy scanned in chart (See row information) Yes Yes Yes Yes Yes Yes  Would patient like information on creating a medical advance directive? - - - - - - -  Pre-existing out of facility DNR order (yellow form or pink MOST form) - - - - - - -    Tobacco Social History   Tobacco Use  Smoking Status Former Smoker  . Types: Pipe  . Quit date: 04/27/1985  . Years since quitting: 33.9  Smokeless Tobacco Never Used     Counseling given: Not Answered   Clinical Intake:  Pre-visit preparation completed: Yes  Pain : No/denies pain     Nutritional Status: BMI of 19-24  Normal Nutritional Risks: None Diabetes: No  How often do you need to have someone help you when you read instructions, pamphlets, or other written materials from your doctor or pharmacy?: 1 - Never What is the  last grade level you completed in school?: college  Interpreter Needed?: No  Information entered by :: ManXie Mast Np  Past Medical History:  Diagnosis Date  . Acute posthemorrhagic anemia 12/23/2011  . Anemia 03/06/2015  . BPH (benign prostatic hyperplasia)   . Cardiomegaly 12/24/2011  . Degeneration of thoracic or thoracolumbar intervertebral disc 12/24/2011  . Edema 04/28/2012  . Fracture, intertrochanteric, left femur ([place]) 12/17/2011  . Hypertension   . Other abnormal blood chemistry 12/24/2011  . Senile osteoporosis 08/25/2012  . Spinal stenosis, lumbar region, without neurogenic claudication 12/24/2011  . Thyroid disease   . Unspecified hearing loss 12/24/2011  . Unspecified hypothyroidism 12/23/2011   Past Surgical History:  Procedure Laterality Date  . COMPRESSION HIP SCREW  12/17/2011   Procedure: COMPRESSION HIP;  Surgeon: Johnny Bridge, MD;  Location: [facility];  Service: Orthopedics;  Laterality: Left;  . FOOT SURGERY     Family History  Problem Relation Age of Onset  . Cancer Mother   . Other Father        accident   Social History   Socioeconomic History  . Marital status: Widowed    Spouse name: Not on file  . Number of children: 3  . Years of education: 74  . Highest education level: Not on file  Occupational History  . Occupation: retired Glass blower/designer  . Financial resource strain: Not hard  at all  . Food insecurity    Worry: Never true    Inability: Never true  . Transportation needs    Medical: No    Non-medical: No  Tobacco Use  . Smoking status: Former Smoker    Types: Pipe    Quit date: 04/27/1985    Years since quitting: 33.9  . Smokeless tobacco: Never Used  Substance and Sexual Activity  . Alcohol use: Yes    Alcohol/week: 7.0 standard drinks    Types: 7 Glasses of wine per week    Comment: 50 oz a week  . Drug use: No  . Sexual activity: Never  Lifestyle  . Physical activity    Days per week: 7 days    Minutes per session: 20 min  .  Stress: Not at all  Relationships  . Social Herbalist on phone: Twice a week    Gets together: Never    Attends religious service: Never    Active member of club or organization: Yes    Attends meetings of clubs or organizations: 1 to 4 times per year    Relationship status: Widowed  Other Topics Concern  . Not on file  Social History Narrative   Lives at [facility]      DIET: none       DO YOU DRINK/EAT THINGS WITH CAFFEINE: coffee and tea      MARITAL STATUS: widow since 2016      WHAT YEAR WERE YOU MARRIED:      DO YOU LIVE IN A HOUSE, APARTMENT, ASSISTED LIVING, CONDO TRAILER ETC.:      IS IT ONE OR MORE STORIES:      HOW MANY PERSONS LIVE IN YOUR HOME:      DO YOU HAVE PETS IN YOUR HOME:      CURRENT OR PAST PROFESSION:      DO YOU EXERCISE: yes , WHAT TYPE AND HOW OFTEN: stand behind chair and work legs              Outpatient Encounter Medications as of 03/30/2019  Medication Sig  . aspirin 81 MG tablet Take 81 mg by mouth daily.  . Cholecalciferol (VITAMIN D3 MAXIMUM STRENGTH) 125 MCG (5000 UT) capsule Take 5,000 Units by mouth daily.  . feeding supplement, ENSURE ENLIVE, (ENSURE ENLIVE) LIQD Take 237 mLs by mouth 2 (two) times daily between meals. (Patient taking differently: Take 237 mLs by mouth daily. Between meals)  . finasteride (PROSCAR) 5 MG tablet Take 5 mg by mouth daily.  . furosemide (LASIX) 20 MG tablet Take 0.5 tablets (10 mg total) by mouth daily.  . Lactobacillus (PROBIOTIC ACIDOPHILUS PO) Take by mouth daily.  [name] levothyroxine (SYNTHROID) 100 MCG tablet TAKE 1 TABLET BY MOUTH ONCE DAILY  . losartan (COZAAR) 50 MG tablet TAKE 1/2 TABLET DAILY TO   STRENGTHEN HEART AND       CONTROL BLOOD PRESSURE  . LUTEIN PO Take by mouth daily.  . Multiple Vitamin (MULTIVITAMIN) tablet Take 1 tablet by mouth daily.  . potassium chloride SA (K-DUR,KLOR-CON) 20 MEQ tablet Take 1 tablet (20 mEq total) by mouth daily.  [name] Resveratrol 250 MG  CAPS Take 250 mg by mouth daily.  . tamsulosin (FLOMAX) 0.4 MG CAPS capsule Take 1 capsule by mouth daily.  . vitamin C (ASCORBIC ACID) 500 MG tablet Take 500 mg by mouth daily.  . vitamin E (VITAMIN E) 400 UNIT capsule Take 400 Units by mouth daily.  . [  DISCONTINUED] CALCIUM-MAGNESIUM-VITAMIN D PO Take by mouth.   No facility-administered encounter medications on file as of 03/30/2019.     Activities of Daily Living In your present state of health, do you have any difficulty performing the following activities: 03/30/2019  Hearing? Y  Comment not using hearing aids  Vision? Y  Comment large print or magnified glasses sometimes  Difficulty concentrating or making decisions? N  Walking or climbing stairs? Y  Comment walker  Dressing or bathing? N  Doing errands, shopping? N  Some recent data might be hidden    Patient Care Team: Mast, Man X, NP as PCP - General (Internal Medicine) [place] Mast, Man X, NP as Nurse Practitioner (Nurse Practitioner) Earlie Server, MD as Consulting Physician (Orthopedic Surgery) Jovita Gamma, MD as Consulting Physician (Neurosurgery) Rutherford Guys, MD as Consulting Physician (Ophthalmology) Jettie Booze, MD as Consulting Physician (Cardiology) Marchia Bond, MD as Consulting Physician (Orthopedic Surgery) Hurman Horn, MD as Consulting Physician (Ophthalmology) Verner Chol, MD as Consulting Physician (Sports Medicine)   Assessment:   This is a routine wellness examination for [place].  Exercise Activities and Dietary recommendations    Goals    . Increase physical activity     Home exercise, more outing    . Patient Stated     Starting today pt will maintain lifestyle.        Fall Risk Fall Risk  01/05/2019 08/05/2017 07/23/2017 09/14/2016 10/24/2015  Falls in the past year? 0 No No Yes Yes  Number falls in past yr: 0 - - 1 1  Injury with Fall? 0 - - Yes No  Comment - - - Cut above left eye that  required 2 stitches -   Is the patient's home free of loose throw rugs in walkways, pet beds, electrical cords, etc?   yes      Grab bars in the bathroom? yes      Handrails on the stairs?   yes      Adequate lighting?   yes  Timed Get Up and Go Performed: 3 seconds  Depression Screen PHQ 2/9 Scores 01/05/2019 08/05/2017 10/24/2015 02/20/2015  PHQ - 2 Score 0 0 0 0    Cognitive Function MMSE - Mini Mental State Exam 03/30/2019 04/07/2018 08/05/2017 10/24/2015  Not completed: (No Data) - - -  Orientation to time 4 5 3 5   Orientation to Place 5 5 5 5   Registration 3 3 3 3   Attention/ Calculation 4 4 5 5   Recall 1 2 0 0  Language- name 2 objects 2 2 2 2   Language- repeat 1 1 1 1   Language- follow 3 step command 3 3 3 2   Language- read & follow direction 1 1 1 1   Write a sentence 1 1 1 1   Copy design 1 1 1 1   Total score 26 28 25 26         Immunization History  Administered Date(s) Administered  . Influenza Whole 05/17/2012, 05/14/2017  . Influenza, High Dose Seasonal PF 05/10/2015, 05/27/2016, 05/14/2017, 05/02/2018  . Influenza-Unspecified 05/17/2013, 05/17/2014, 05/10/2015  . Pneumococcal Conjugate-13 11/11/2017  . Pneumococcal Polysaccharide-23 05/18/2011  . Tdap 11/11/2017  . Zoster Recombinat (Shingrix) 02/21/2017    Qualifies for Shingles Vaccine? yes  Screening Tests Health Maintenance  Topic Date Due  . DEXA SCAN  05/15/2018  . INFLUENZA VACCINE  03/18/2019  . TETANUS/TDAP  11/12/2027  . PNA vac Low Risk Adult  Completed   Cancer Screenings: Lung: Low Dose CT  Chest recommended if Age 50-80 years, 30 pack-year currently smoking OR have quit w/in 15years No Colorectal: none  Additional Screenings: none Hepatitis C Screening:      Plan:    Plan to obtain Shringrix I have personally reviewed and noted the following in the patient's chart:   . Medical and social history . Use of alcohol, tobacco or illicit drugs  . Current medications and supplements .  Functional ability and status . Nutritional status . Physical activity . Advanced directives . List of other physicians . Hospitalizations, surgeries, and ER visits in previous 12 months . Vitals . Screenings to include cognitive, depression, and falls . Referrals and appointments  In addition, I have reviewed and discussed with patient certain preventive protocols, quality metrics, and best practice recommendations. A written personalized care plan for preventive services as well as general preventive health recommendations were provided to patient.     Man X Mast, NP  04/04/2019

## 2019-03-30 NOTE — Patient Instructions (Addendum)
Plan of obtain Shringris, order sent   Cory Burton , Thank you for taking time to come for your Medicare Wellness Visit. I appreciate your ongoing commitment to your health goals. Please review the following plan we discussed and let me know if I can assist you in the future.    Fall Prevention in the Home Falls can cause injuries. They can happen to people of all ages. There are many things you can do to make your home safe and to help prevent falls. What can I do on the outside of my home?  Regularly fix the edges of walkways and driveways and fix any cracks.  Remove anything that might make you trip as you walk through a door, such as a raised step or threshold.  Trim any bushes or trees on the path to your home.  Use bright outdoor lighting.  Clear any walking paths of anything that might make someone trip, such as rocks or tools.  Regularly check to see if handrails are loose or broken. Make sure that both sides of any steps have handrails.  Any raised decks and porches should have guardrails on the edges.  Have any leaves, snow, or ice cleared regularly.  Use sand or salt on walking paths during winter.  Clean up any spills in your garage right away. This includes oil or grease spills. What can I do in the bathroom?  Use night lights.  Install grab bars by the toilet and in the tub and shower. Do not use towel bars as grab bars.  Use non-skid mats or decals in the tub or shower.  If you need to sit down in the shower, use a plastic, non-slip stool.  Keep the floor dry. Clean up any water that spills on the floor as soon as it happens.  Remove soap buildup in the tub or shower regularly.  Attach bath mats securely with double-sided non-slip rug tape.  Do not have throw rugs and other things on the floor that can make you trip. What can I do in the bedroom?  Use night lights.  Make sure that you have a light by your bed that is easy to reach.  Do not use any  sheets or blankets that are too big for your bed. They should not hang down onto the floor.  Have a firm chair that has side arms. You can use this for support while you get dressed.  Do not have throw rugs and other things on the floor that can make you trip. What can I do in the kitchen?  Clean up any spills right away.  Avoid walking on wet floors.  Keep items that you use a lot in easy-to-reach places.  If you need to reach something above you, use a strong step stool that has a grab bar.  Keep electrical cords out of the way.  Do not use floor polish or wax that makes floors slippery. If you must use wax, use non-skid floor wax.  Do not have throw rugs and other things on the floor that can make you trip. What can I do with my stairs?  Do not leave any items on the stairs.  Make sure that there are handrails on both sides of the stairs and use them. Fix handrails that are broken or loose. Make sure that handrails are as long as the stairways.  Check any carpeting to make sure that it is firmly attached to the stairs. Fix any carpet that is  loose or worn.  Avoid having throw rugs at the top or bottom of the stairs. If you do have throw rugs, attach them to the floor with carpet tape.  Make sure that you have a light switch at the top of the stairs and the bottom of the stairs. If you do not have them, ask someone to add them for you. What else can I do to help prevent falls?  Wear shoes that:  Do not have high heels.  Have rubber bottoms.  Are comfortable and fit you well.  Are closed at the toe. Do not wear sandals.  If you use a stepladder:  Make sure that it is fully opened. Do not climb a closed stepladder.  Make sure that both sides of the stepladder are locked into place.  Ask someone to hold it for you, if possible.  Clearly mark and make sure that you can see:  Any grab bars or handrails.  First and last steps.  Where the edge of each step  is.  Use tools that help you move around (mobility aids) if they are needed. These include:  Canes.  Walkers.  Scooters.  Crutches.  Turn on the lights when you go into a dark area. Replace any light bulbs as soon as they burn out.  Set up your furniture so you have a clear path. Avoid moving your furniture around.  If any of your floors are uneven, fix them.  If there are any pets around you, be aware of where they are.  Review your medicines with your doctor. Some medicines can make you feel dizzy. This can increase your chance of falling. Ask your doctor what other things that you can do to help prevent falls. This information is not intended to replace advice given to you by your health care provider. Make sure you discuss any questions you have with your health care provider. Document Released: 05/30/2009 Document Revised: 01/09/2016 Document Reviewed: 09/07/2014 Elsevier Interactive Patient Education  2017 [employer].

## 2019-04-07 ENCOUNTER — Other Ambulatory Visit: Payer: Self-pay | Admitting: Family Medicine

## 2019-04-07 ENCOUNTER — Other Ambulatory Visit: Payer: Self-pay | Admitting: Internal Medicine

## 2019-04-07 DIAGNOSIS — R609 Edema, unspecified: Secondary | ICD-10-CM

## 2019-04-07 DIAGNOSIS — I10 Essential (primary) hypertension: Secondary | ICD-10-CM

## 2019-04-07 MED ORDER — IRBESARTAN 75 MG PO TABS: 75.0000 mg | ORAL_TABLET | Freq: Every day | ORAL | 1 refills | Status: DC

## 2019-08-13 ENCOUNTER — Other Ambulatory Visit: Payer: Self-pay | Admitting: Nurse Practitioner

## 2019-08-13 DIAGNOSIS — R609 Edema, unspecified: Secondary | ICD-10-CM

## 2019-08-13 DIAGNOSIS — I10 Essential (primary) hypertension: Secondary | ICD-10-CM

## 2019-08-22 ENCOUNTER — Other Ambulatory Visit: Payer: Self-pay | Admitting: Nurse Practitioner

## 2019-08-22 DIAGNOSIS — E038 Other specified hypothyroidism: Secondary | ICD-10-CM

## 2019-11-09 ENCOUNTER — Non-Acute Institutional Stay: Payer: Medicare Other | Admitting: Nurse Practitioner

## 2019-11-09 ENCOUNTER — Encounter: Payer: Self-pay | Admitting: Nurse Practitioner

## 2019-11-09 ENCOUNTER — Other Ambulatory Visit: Payer: Self-pay

## 2019-11-09 DIAGNOSIS — E039 Hypothyroidism, unspecified: Secondary | ICD-10-CM

## 2019-11-09 DIAGNOSIS — I739 Peripheral vascular disease, unspecified: Secondary | ICD-10-CM | POA: Diagnosis not present

## 2019-11-09 DIAGNOSIS — D649 Anemia, unspecified: Secondary | ICD-10-CM

## 2019-11-09 DIAGNOSIS — I10 Essential (primary) hypertension: Secondary | ICD-10-CM

## 2019-11-09 DIAGNOSIS — N400 Enlarged prostate without lower urinary tract symptoms: Secondary | ICD-10-CM

## 2019-11-09 NOTE — Assessment & Plan Note (Signed)
Stable, underwent Urology evaluation,  continue Tamsulosin,

## 2019-11-09 NOTE — Patient Instructions (Addendum)
CBC/diff, CMP/eGFR, TSH next week. the next appoint with Dr. Lyndel Safe

## 2019-11-09 NOTE — Assessment & Plan Note (Signed)
Stable, TSH 4.99 09/29/18, continue Levothyroxine 171mcg qd, update TSH

## 2019-11-09 NOTE — Assessment & Plan Note (Signed)
Blood pressure is controlled, continue Irbesartan 25m qd, update CMP/eGFR

## 2019-11-09 NOTE — Assessment & Plan Note (Signed)
Hgb 10.4 09/29/18, update CBC

## 2019-11-09 NOTE — Progress Notes (Signed)
Location:   clinic [place]   Place of Service:  Clinic (12) Provider: Marlana Latus NP  Code Status: DNR Goals of Care: [place] 01/05/2019  Does Cory Burton Have a Medical Advance Directive? Yes  Type of Paramedic of [place];Living will  Does Cory Burton want to make changes to medical advance directive? No - Cory Burton declined  Copy of [place] in Chart? Yes - validated most recent copy scanned in chart (See row information)  Would Cory Burton like information on creating a medical advance directive? -  Pre-existing out of facility DNR order (yellow form or pink MOST form) -     Chief Complaint  Cory Burton presents with  . Medical Management of Chronic Issues    Follow Up. No concerns. Cory Burton states he got the shingles vaccine at [facility], he will call with date.     HPI: Cory Burton is a 86 y.o. male seen today for medical management of chronic diseases.    The Cory Burton has hx of HTN, blood pressure is controlled on Irbesartan '75mg'$  qd. Hx of BPH, no urinary retention, on Tamsulosin 0.'4mg'$  qd. Hypothyroidism, stable, on Levothyroxine 136mg qd. BLE edema, trace, on Furosemide '20mg'$  qd, Kcl 222m qd.    Past Medical History:  Diagnosis Date  . Acute posthemorrhagic anemia 12/23/2011  . Anemia 03/06/2015  . BPH (benign prostatic hyperplasia)   . Cardiomegaly 12/24/2011  . Degeneration of thoracic or thoracolumbar intervertebral disc 12/24/2011  . Edema 04/28/2012  . Fracture, intertrochanteric, left femur ([place]5/09/2011  . Hypertension   . Other abnormal blood chemistry 12/24/2011  . Senile osteoporosis 08/25/2012  . Spinal stenosis, lumbar region, without neurogenic claudication 12/24/2011  . Thyroid disease   . Unspecified hearing loss 12/24/2011  . Unspecified hypothyroidism 12/23/2011    Past Surgical History:  Procedure Laterality Date  . COMPRESSION HIP SCREW  12/17/2011   Procedure: COMPRESSION HIP;  Surgeon: JoJohnny BridgeMD;  Location: [facility];   Service: Orthopedics;  Laterality: Left;  . FOOT SURGERY      Allergies  Allergen Reactions  . Clindamycin/Lincomycin     Developed C. dificile  . Horse-Derived Products     Horse serum  . Mold Extract [Trichophyton]     Allergies as of 11/09/2019      Reactions   Clindamycin/lincomycin    Developed C. dificile   Horse-derived Products    Horse serum   Mold Extract [trichophyton]       Medication List       Accurate as of November 09, 2019 11:59 PM. If you have any questions, ask your nurse or doctor.        aspirin 81 MG tablet Take 81 mg by mouth daily.   feeding supplement (ENSURE ENLIVE) Liqd Take 237 mLs by mouth 2 (two) times daily between meals. What changed:   when to take this  additional instructions   finasteride 5 MG tablet Commonly known as: PROSCAR Take 5 mg by mouth daily.   furosemide 20 MG tablet Commonly known as: LASIX TAKE ONE-HALF TABLET BY  MOUTH DAILY   irbesartan 75 MG tablet Commonly known as: AVAPRO TAKE 1 TABLET BY MOUTH  DAILY What changed: Another medication with the same name was removed. Continue taking this medication, and follow the directions you see here. Changed by: Marque Bango X Jordi Kamm, NP   levothyroxine 100 MCG tablet Commonly known as: SYNTHROID TAKE 1 TABLET BY MOUTH ONCE DAILY   LUTEIN PO Take by mouth daily.  multivitamin tablet Take 1 tablet by mouth daily.   potassium chloride SA 20 MEQ tablet Commonly known as: KLOR-CON TAKE 1 TABLET BY MOUTH  DAILY   PROBIOTIC ACIDOPHILUS PO Take by mouth daily.   Resveratrol 250 MG Caps Take 250 mg by mouth daily.   tamsulosin 0.4 MG Caps capsule Commonly known as: FLOMAX Take 1 capsule by mouth daily.   vitamin C 500 MG tablet Commonly known as: ASCORBIC ACID Take 500 mg by mouth daily.   Vitamin D3 Maximum Strength 125 MCG (5000 UT) capsule Generic drug: Cholecalciferol Take 5,000 Units by mouth daily.   vitamin E 180 MG (400 UNITS) capsule Generic drug:  vitamin E Take 400 Units by mouth daily.       Review of Systems:  Review of Systems  Constitutional: Positive for unexpected weight change. Negative for activity change, appetite change, fatigue and fever.       Lost #5Ibs in the past 10 months  HENT: Positive for hearing loss. Negative for congestion and voice change.   Eyes: Negative for visual disturbance.  Respiratory: Negative for cough, shortness of breath and wheezing.   Cardiovascular: Positive for leg swelling.  Gastrointestinal: Negative for abdominal distention, abdominal pain and constipation.  Genitourinary: Negative for difficulty urinating, dysuria, frequency and urgency.       No bathroom trips at night mostly  Musculoskeletal: Positive for arthralgias, back pain and gait problem.       Right leg pain if not exercise regularly  Skin: Negative for color change.  Neurological: Negative for speech difficulty, weakness, light-headedness and headaches.  Psychiatric/Behavioral: Negative for agitation, behavioral problems and sleep disturbance. The Cory Burton is not nervous/anxious.     Health Maintenance  Topic Date Due  . DEXA SCAN  05/15/2018  . TETANUS/TDAP  11/12/2027  . INFLUENZA VACCINE  Completed  . PNA vac Low Risk Adult  Completed    Physical Exam: Vitals:   11/09/19 1508  BP: (!) 136/58  Pulse: 78  Temp: 97.8 F (36.6 C)  SpO2: 93%  Weight: 128 lb 12.8 oz (58.4 kg)  Height: '5\' 5"'$  (1.651 m)   Body mass index is 21.43 kg/m. Physical Exam Vitals reviewed.  Constitutional:      General: He is not in acute distress.    Appearance: He is not ill-appearing.  HENT:     Head: Normocephalic and atraumatic.     Mouth/Throat:     Mouth: Mucous membranes are moist.  Eyes:     Extraocular Movements: Extraocular movements intact.     Conjunctiva/sclera: Conjunctivae normal.     Pupils: Pupils are equal, round, and reactive to light.  Neck:     Vascular: No carotid bruit.  Cardiovascular:     Rate and  Rhythm: Normal rate and regular rhythm.     Pulses:          Dorsalis pedis pulses are 1+ on the right side and 1+ on the left side.     Heart sounds: No murmur.  Pulmonary:     Breath sounds: No wheezing.  Abdominal:     General: There is no distension.     Palpations: Abdomen is soft.     Tenderness: There is no abdominal tenderness. There is no guarding or rebound.  Musculoskeletal:     Right lower leg: Edema present.     Left lower leg: Edema present.     Comments: Trace edema BLE. Ambulate with walker.   Lymphadenopathy:     Cervical: No cervical  adenopathy.  Skin:    General: Skin is warm and dry.     Comments: Sebaceous cyst mid back. A small epidermoid cyst under umbilical button. A small umbilical hernia.    Neurological:     General: No focal deficit present.     Mental Status: He is alert. Mental status is at baseline.     Cranial Nerves: No cranial nerve deficit.     Motor: No weakness.     Coordination: Coordination normal.     Gait: Gait abnormal.     Comments: Ambulates with walker.   Psychiatric:        Mood and Affect: Mood normal.        Behavior: Behavior normal.        Thought Content: Thought content normal.        Judgment: Judgment normal.     Labs reviewed: Basic Metabolic Panel: No results for input(s): NA, K, CL, CO2, GLUCOSE, BUN, CREATININE, CALCIUM, MG, PHOS, TSH in the last 8760 hours. Liver Function Tests: No results for input(s): AST, ALT, ALKPHOS, BILITOT, PROT, ALBUMIN in the last 8760 hours. No results for input(s): LIPASE, AMYLASE in the last 8760 hours. No results for input(s): AMMONIA in the last 8760 hours. CBC: No results for input(s): WBC, NEUTROABS, HGB, HCT, MCV, PLT in the last 8760 hours. Lipid Panel: No results for input(s): CHOL, HDL, LDLCALC, TRIG, CHOLHDL, LDLDIRECT in the last 8760 hours. No results found for: HGBA1C  Procedures since last visit: No results found.  Assessment/Plan  HTN (hypertension) Blood  pressure is controlled, continue Irbesartan '75mg'$  qd, update CMP/eGFR  PVD (peripheral vascular disease) (HCC) Trace edema BLE, continue Furosemide.   Hypothyroidism Stable, TSH 4.99 09/29/18, continue Levothyroxine 161mg qd, update TSH  BPH (benign prostatic hyperplasia) Stable, underwent Urology evaluation,  continue Tamsulosin,  Anemia Hgb 10.4 09/29/18, update CBC   Labs/tests ordered:  CBC/diff, CMP/eGFR, TSH   Next appt:   with Dr. GLyndel Safe

## 2019-11-09 NOTE — Assessment & Plan Note (Signed)
Trace edema BLE, continue Furosemide.  

## 2019-11-10 ENCOUNTER — Encounter: Payer: Self-pay | Admitting: Nurse Practitioner

## 2019-11-16 ENCOUNTER — Other Ambulatory Visit: Payer: Self-pay

## 2019-11-16 DIAGNOSIS — D649 Anemia, unspecified: Secondary | ICD-10-CM

## 2019-11-16 DIAGNOSIS — I10 Essential (primary) hypertension: Secondary | ICD-10-CM

## 2019-11-16 DIAGNOSIS — E039 Hypothyroidism, unspecified: Secondary | ICD-10-CM

## 2019-11-17 LAB — CBC WITH DIFFERENTIAL/PLATELET
Absolute Monocytes: 814 cells/uL (ref 200–950)
Basophils Absolute: 40 cells/uL (ref 0–200)
Basophils Relative: 0.5 %
Eosinophils Absolute: 221 cells/uL (ref 15–500)
Eosinophils Relative: 2.8 %
HCT: 30.8 % — ABNORMAL LOW (ref 38.5–50.0)
Hemoglobin: 10.4 g/dL — ABNORMAL LOW (ref 13.2–17.1)
Lymphs Abs: 3089 cells/uL (ref 850–3900)
MCH: 35.5 pg — ABNORMAL HIGH (ref 27.0–33.0)
MCHC: 33.8 g/dL (ref 32.0–36.0)
MCV: 105.1 fL — ABNORMAL HIGH (ref 80.0–100.0)
MPV: 10.4 fL (ref 7.5–12.5)
Monocytes Relative: 10.3 %
Neutro Abs: 3737 cells/uL (ref 1500–7800)
Neutrophils Relative %: 47.3 %
Platelets: 243 10*3/uL (ref 140–400)
RBC: 2.93 10*6/uL — ABNORMAL LOW (ref 4.20–5.80)
RDW: 12.5 % (ref 11.0–15.0)
Total Lymphocyte: 39.1 %
WBC: 7.9 10*3/uL (ref 3.8–10.8)

## 2019-11-17 LAB — COMPLETE METABOLIC PANEL WITH GFR
AG Ratio: 1.6 (calc) (ref 1.0–2.5)
ALT: 10 U/L (ref 9–46)
AST: 18 U/L (ref 10–35)
Albumin: 3.3 g/dL — ABNORMAL LOW (ref 3.6–5.1)
Alkaline phosphatase (APISO): 59 U/L (ref 35–144)
BUN/Creatinine Ratio: 24 (calc) — ABNORMAL HIGH (ref 6–22)
BUN: 26 mg/dL — ABNORMAL HIGH (ref 7–25)
CO2: 26 mmol/L (ref 20–32)
Calcium: 9.3 mg/dL (ref 8.6–10.3)
Chloride: 106 mmol/L (ref 98–110)
Creat: 1.1 mg/dL (ref 0.70–1.11)
GFR, Est African American: 64 mL/min/{1.73_m2} (ref >=60)
GFR, Est Non African American: 55 mL/min/{1.73_m2} — ABNORMAL LOW (ref >=60)
Globulin: 2.1 g/dL (calc) (ref 1.9–3.7)
Glucose, Bld: 90 mg/dL (ref 65–99)
Potassium: 4.8 mmol/L (ref 3.5–5.3)
Sodium: 139 mmol/L (ref 135–146)
Total Bilirubin: 0.4 mg/dL (ref 0.2–1.2)
Total Protein: 5.4 g/dL — ABNORMAL LOW (ref 6.1–8.1)

## 2019-11-17 LAB — TSH: TSH: 3.98 mIU/L (ref 0.40–4.50)

## 2020-01-18 ENCOUNTER — Other Ambulatory Visit: Payer: Self-pay | Admitting: Nurse Practitioner

## 2020-01-18 DIAGNOSIS — E038 Other specified hypothyroidism: Secondary | ICD-10-CM

## 2020-03-22 ENCOUNTER — Non-Acute Institutional Stay: Payer: Medicare Other | Admitting: Internal Medicine

## 2020-03-22 ENCOUNTER — Other Ambulatory Visit: Payer: Self-pay

## 2020-03-22 ENCOUNTER — Encounter: Payer: Self-pay | Admitting: Internal Medicine

## 2020-03-22 VITALS — BP 132/56 | HR 69 | Temp 97.8°F | Ht 65.0 in | Wt 128.2 lb

## 2020-03-22 DIAGNOSIS — E039 Hypothyroidism, unspecified: Secondary | ICD-10-CM

## 2020-03-22 DIAGNOSIS — N400 Enlarged prostate without lower urinary tract symptoms: Secondary | ICD-10-CM | POA: Diagnosis not present

## 2020-03-22 DIAGNOSIS — I739 Peripheral vascular disease, unspecified: Secondary | ICD-10-CM

## 2020-03-22 DIAGNOSIS — D649 Anemia, unspecified: Secondary | ICD-10-CM

## 2020-03-22 DIAGNOSIS — R2681 Unsteadiness on feet: Secondary | ICD-10-CM

## 2020-03-22 DIAGNOSIS — I10 Essential (primary) hypertension: Secondary | ICD-10-CM | POA: Diagnosis not present

## 2020-03-22 DIAGNOSIS — R609 Edema, unspecified: Secondary | ICD-10-CM

## 2020-03-22 DIAGNOSIS — R05 Cough: Secondary | ICD-10-CM

## 2020-03-22 DIAGNOSIS — R059 Cough, unspecified: Secondary | ICD-10-CM

## 2020-03-22 NOTE — Progress Notes (Signed)
Location:  [place] [facility] Service:  Clinic (12)  Provider:   Code Status:  Goals of Care:  Advanced Directives 01/05/2019  Does Patient Have a Medical Advance Directive? Yes  Type of Paramedic of [place];Living will  Does patient want to make changes to medical advance directive? No - Patient declined  Copy of [place] in Chart? Yes - validated most recent copy scanned in chart (See row information)  Would patient like information on creating a medical advance directive? -  Pre-existing out of facility DNR order (yellow form or pink MOST form) -     Chief Complaint  Patient presents with  . Medical Management of Chronic Issues    Patient returns to the clinic for follow up.   [name] Health Maintenance        HPI: Patient is a 86 y.o. male seen today for medical management of chronic diseases.   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  Did not have any acute complaints. Walks with a walker Has not had any falls But the daughter was there in the room and she thinks that his gait has become more unstable. Patient's weight is stable.  He has lost some weight over past 1 year.  He says he does not like the food. He is very independent.  Is not driving anymore due to his vision.  Cognitively intact Daughter is the POA.  Has a son who lives in [place]  Past Medical History:  Diagnosis Date  . Acute posthemorrhagic anemia 12/23/2011  . Anemia 03/06/2015  . BPH (benign prostatic hyperplasia)   . Cardiomegaly 12/24/2011  . Degeneration of thoracic or thoracolumbar intervertebral disc 12/24/2011  . Edema 04/28/2012  . Fracture, intertrochanteric, left femur ([place]) 12/17/2011  . Hypertension   . Other abnormal blood chemistry 12/24/2011  . Senile osteoporosis 08/25/2012  . Spinal stenosis, lumbar region,  without neurogenic claudication 12/24/2011  . Thyroid disease   . Unspecified hearing loss 12/24/2011  . Unspecified hypothyroidism 12/23/2011    Past Surgical History:  Procedure Laterality Date  . COMPRESSION HIP SCREW  12/17/2011   Procedure: COMPRESSION HIP;  Surgeon: Johnny Bridge, MD;  Location: [facility];  Service: Orthopedics;  Laterality: Left;  . FOOT SURGERY      Allergies  Allergen Reactions  . Clindamycin/Lincomycin     Developed C. dificile  . Horse-Derived Products     Horse serum  . Mold Extract [Trichophyton]     Outpatient Encounter Medications as of 03/22/2020  Medication Sig  . aspirin 81 MG tablet Take 81 mg by mouth daily.  . Cholecalciferol (VITAMIN D3 MAXIMUM STRENGTH) 125 MCG (5000 UT) capsule Take 5,000 Units by mouth daily.  . feeding supplement, ENSURE ENLIVE, (ENSURE ENLIVE) LIQD Take 237 mLs by mouth 2 (two) times daily between meals. (Patient taking differently: Take 237 mLs by mouth daily. Between meals)  . finasteride (PROSCAR) 5 MG tablet Take 5 mg by mouth daily.  . furosemide (LASIX) 20 MG tablet TAKE ONE-HALF TABLET BY  MOUTH DAILY  . irbesartan (AVAPRO) 75 MG tablet TAKE 1 TABLET BY MOUTH  DAILY  . Lactobacillus (PROBIOTIC ACIDOPHILUS PO) Take by mouth daily.  [name] levothyroxine (SYNTHROID) 100 MCG tablet TAKE 1 TABLET BY MOUTH ONCE DAILY  . LUTEIN PO Take by mouth daily.  . Multiple Vitamin (MULTIVITAMIN)  tablet Take 1 tablet by mouth daily.  . potassium chloride SA (K-DUR) 20 MEQ tablet TAKE 1 TABLET BY MOUTH  DAILY  . Resveratrol 250 MG CAPS Take 250 mg by mouth daily.  . tamsulosin (FLOMAX) 0.4 MG CAPS capsule Take 1 capsule by mouth daily.  . vitamin C (ASCORBIC ACID) 500 MG tablet Take 500 mg by mouth daily.  . vitamin E (VITAMIN E) 400 UNIT capsule Take 400 Units by mouth daily.   No facility-administered encounter medications on file as of 03/22/2020.    Review of Systems:  Review of Systems Review of Systems  Constitutional: Negative for  activity change, appetite change, chills, diaphoresis, fatigue and fever.  HENT: Negative for mouth sores, postnasal drip, rhinorrhea, sinus pain and sore throat.   Respiratory: Negative for apnea, cough, chest tightness, shortness of breath and wheezing.   Cardiovascular: Negative for chest pain, palpitations and leg swelling.  Gastrointestinal: Negative for abdominal distention, abdominal pain, constipation, diarrhea, nausea and vomiting.  Genitourinary: Negative for dysuria   Musculoskeletal: Negative for arthralgias, joint swelling and myalgias.  Skin: Negative for rash.  Neurological: Negative for , syncope, weakness, light-headedness and numbness.  Psychiatric/Behavioral: Negative for behavioral problems, confusion and sleep disturbance.     Health Maintenance  Topic Date Due  . DEXA SCAN  05/15/2018  . INFLUENZA VACCINE  03/17/2020  . TETANUS/TDAP  11/12/2027  . COVID-19 Vaccine  Completed  . PNA vac Low Risk Adult  Completed    Physical Exam: Vitals:   03/22/20 0933  BP: (!) 132/56  Pulse: 69  Temp: 97.8 F (36.6 C)  SpO2: 97%  Weight: 128 lb 3.2 oz (58.2 kg)  Height: 5\' 5"  (1.651 m)   Body mass index is 21.33 kg/m. Physical Exam Vitals reviewed.  Constitutional:      Appearance: Normal appearance.  HENT:     Head: Normocephalic.     Right Ear: Tympanic membrane normal.     Left Ear: Tympanic membrane normal.     Nose: Nose normal.     Mouth/Throat:     Mouth: Mucous membranes are moist.     Pharynx: Oropharynx is clear.  Eyes:     Pupils: Pupils are equal, round, and reactive to light.  Cardiovascular:     Rate and Rhythm: Normal rate and regular rhythm.     Pulses: Normal pulses.     Heart sounds: Normal heart sounds.  Pulmonary:     Effort: Pulmonary effort is normal. No respiratory distress.     Breath sounds: Normal breath sounds. No wheezing or rales.  Abdominal:     General: Abdomen is flat. Bowel sounds are normal.     Palpations: Abdomen is  soft.  Musculoskeletal:        General: Swelling present.     Cervical back: Neck supple.  Skin:    General: Skin is warm and dry.  Neurological:     General: No focal deficit present.     Mental Status: He is alert and oriented to person, place, and time.     Comments: Unsteady Gait  Psychiatric:        Mood and Affect: Mood normal.        Thought Content: Thought content normal.        Judgment: Judgment normal.     Labs reviewed: Basic Metabolic Panel: Recent Labs    11/15/19 0837  NA 139  K 4.8  CL 106  CO2 26  GLUCOSE 90  BUN 26*  CREATININE  1.10  CALCIUM 9.3  TSH 3.98   Liver Function Tests: Recent Labs    11/15/19 0837  AST 18  ALT 10  BILITOT 0.4  PROT 5.4*   No results for input(s): LIPASE, AMYLASE in the last 8760 hours. No results for input(s): AMMONIA in the last 8760 hours. CBC: Recent Labs    11/15/19 0837  WBC 7.9  NEUTROABS 3,737  HGB 10.4*  HCT 30.8*  MCV 105.1*  PLT 243   Lipid Panel: No results for input(s): CHOL, HDL, LDLCALC, TRIG, CHOLHDL, LDLDIRECT in the last 8760 hours. No results found for: HGBA1C  Procedures since last visit: No results found.  Assessment/Plan Essential hypertension Blood pressure stable on Avapro  Hypothyroidism, unspecified type TSH normal in 3/21 Benign prostatic hyperplasia without lower urinary tract symptoms On Proscar and Flomax Follows with urology Anemia, unspecified type Macrocytic most likely due to MDS Does not need any further work-up Will get iron studies and B12 Edema, unspecified type Continues on low-dose of Lasix and TED hose Unstable gait Make a referral to therapy Cough Daughter said that patient sometimes cough while eating Discussed in detail.  To see speech if it gets worse  Patient was not sure about his Shingrix would let us know in next visit    Labs/tests ordered:  * No order type specified * Next appt:  Visit date not found   Total time spent in this patient  care encounter was  45_  minutes; greater than 50% of the visit spent counseling patient and staff, reviewing records , Labs and coordinating care for problems addressed at this encounter.

## 2020-07-03 ENCOUNTER — Other Ambulatory Visit: Payer: Self-pay | Admitting: Nurse Practitioner

## 2020-07-03 DIAGNOSIS — R609 Edema, unspecified: Secondary | ICD-10-CM

## 2020-07-03 DIAGNOSIS — I10 Essential (primary) hypertension: Secondary | ICD-10-CM

## 2020-07-03 DIAGNOSIS — E038 Other specified hypothyroidism: Secondary | ICD-10-CM

## 2020-07-18 ENCOUNTER — Other Ambulatory Visit: Payer: Self-pay

## 2020-07-18 DIAGNOSIS — D649 Anemia, unspecified: Secondary | ICD-10-CM

## 2020-07-19 LAB — VITAMIN B12: Vitamin B-12: 617 pg/mL (ref 200–1100)

## 2020-07-19 LAB — IRON, TOTAL/TOTAL IRON BINDING CAP
%SAT: 15 % (calc) — ABNORMAL LOW (ref 20–48)
Iron: 36 ug/dL — ABNORMAL LOW (ref 50–180)
TIBC: 239 mcg/dL (calc) — ABNORMAL LOW (ref 250–425)

## 2020-07-19 LAB — CBC WITH DIFFERENTIAL/PLATELET
Absolute Monocytes: 683 cells/uL (ref 200–950)
Basophils Absolute: 59 cells/uL (ref 0–200)
Basophils Relative: 0.9 %
Eosinophils Absolute: 150 cells/uL (ref 15–500)
Eosinophils Relative: 2.3 %
HCT: 28.8 % — ABNORMAL LOW (ref 38.5–50.0)
Hemoglobin: 9.7 g/dL — ABNORMAL LOW (ref 13.2–17.1)
Lymphs Abs: 1898 cells/uL (ref 850–3900)
MCH: 35.1 pg — ABNORMAL HIGH (ref 27.0–33.0)
MCHC: 33.7 g/dL (ref 32.0–36.0)
MCV: 104.3 fL — ABNORMAL HIGH (ref 80.0–100.0)
MPV: 10 fL (ref 7.5–12.5)
Monocytes Relative: 10.5 %
Neutro Abs: 3712 cells/uL (ref 1500–7800)
Neutrophils Relative %: 57.1 %
Platelets: 297 10*3/uL (ref 140–400)
RBC: 2.76 10*6/uL — ABNORMAL LOW (ref 4.20–5.80)
RDW: 12.2 % (ref 11.0–15.0)
Total Lymphocyte: 29.2 %
WBC: 6.5 10*3/uL (ref 3.8–10.8)

## 2020-07-19 LAB — FERRITIN: Ferritin: 106 ng/mL (ref 24–380)

## 2020-07-25 ENCOUNTER — Encounter: Payer: Self-pay | Admitting: Nurse Practitioner

## 2020-08-08 ENCOUNTER — Encounter: Payer: Self-pay | Admitting: Nurse Practitioner

## 2020-08-15 ENCOUNTER — Non-Acute Institutional Stay: Payer: Medicare Other | Admitting: Nurse Practitioner

## 2020-08-15 ENCOUNTER — Other Ambulatory Visit: Payer: Self-pay

## 2020-08-15 DIAGNOSIS — I10 Essential (primary) hypertension: Secondary | ICD-10-CM

## 2020-08-15 DIAGNOSIS — N400 Enlarged prostate without lower urinary tract symptoms: Secondary | ICD-10-CM | POA: Diagnosis not present

## 2020-08-15 DIAGNOSIS — E039 Hypothyroidism, unspecified: Secondary | ICD-10-CM

## 2020-08-15 DIAGNOSIS — R609 Edema, unspecified: Secondary | ICD-10-CM | POA: Diagnosis not present

## 2020-08-15 DIAGNOSIS — D509 Iron deficiency anemia, unspecified: Secondary | ICD-10-CM

## 2020-08-15 NOTE — Progress Notes (Signed)
Location:   [facility]   Place of Service:  Clinic (12) Provider: Marlana Latus NP  Code Status: DNR Goals of Care: [place] 01/05/2019  Does Patient Have a Medical Advance Directive? Yes  Type of Paramedic of [place];Living will  Does patient want to make changes to medical advance directive? No - Patient declined  Copy of [place] in Chart? Yes - validated most recent copy scanned in chart (See row information)  Would patient like information on creating a medical advance directive? -  Pre-existing out of facility DNR order (yellow form or pink MOST form) -     Chief Complaint  Patient presents with  . Medical Management of Chronic Issues    HPI: Patient is a 86 y.o. male seen today for medical management of chronic diseases.    HTN, blood pressure is controlled on Irbesartan $RemoveBefor'75mg'DRJFjBlExioF$  qd. Self stopped taking ASA $RemoveBe'81mg'HXILNpJZa$  qd, Bun/creat 26/1.10 11/15/19  BPH, no urinary retention, on Tamsulosin 0.$RemoveBeforeD'4mg'lepgHqrQikeBRv$  qd. 03/29/20 Urology good FOS, emptying bladder well  Hypothyroidism, stable, on Levothyroxine 163mcg qd. TSH 3.98 11/15/19  BLE edema, trace, on Furosemide $RemoveBefor'20mg'LlJoRNrlCCUm$  qd, Kcl 62meq qd.  Anemia, Hgb 9.7, Iron 36, Vit B12 617 07/17/20, may need start Fe 2x/wk  Past Medical History:  Diagnosis Date  . Acute posthemorrhagic anemia 12/23/2011  . Anemia 03/06/2015  . BPH (benign prostatic hyperplasia)   . Cardiomegaly 12/24/2011  . Degeneration of thoracic or thoracolumbar intervertebral disc 12/24/2011  . Edema 04/28/2012  . Fracture, intertrochanteric, left femur ([place]) 12/17/2011  . Hypertension   . Other abnormal blood chemistry 12/24/2011  . Senile osteoporosis 08/25/2012  . Spinal stenosis, lumbar region, without neurogenic claudication 12/24/2011  . Thyroid disease   . Unspecified hearing loss 12/24/2011  . Unspecified hypothyroidism 12/23/2011    Past Surgical History:  Procedure Laterality Date  . COMPRESSION HIP SCREW  12/17/2011   Procedure:  COMPRESSION HIP;  Surgeon: Johnny Bridge, MD;  Location: [facility];  Service: Orthopedics;  Laterality: Left;  . FOOT SURGERY      Allergies  Allergen Reactions  . Clindamycin/Lincomycin     Developed C. dificile  . Horse-Derived Products     Horse serum  . Mold Extract [Trichophyton]     Allergies as of 08/15/2020      Reactions   Clindamycin/lincomycin    Developed C. dificile   Horse-derived Products    Horse serum   Mold Extract [trichophyton]       Medication List       Accurate as of August 15, 2020 11:59 PM. If you have any questions, ask your nurse or doctor.        aspirin 81 MG tablet Take 81 mg by mouth daily.   B-12 PO Take 3,000 mg by mouth daily.   CAL-MAG-ZINC-D PO Take 1 tablet by mouth daily.   feeding supplement Liqd Take 237 mLs by mouth 2 (two) times daily between meals. What changed:   when to take this  additional instructions   finasteride 5 MG tablet Commonly known as: PROSCAR Take 5 mg by mouth daily.   furosemide 20 MG tablet Commonly known as: LASIX TAKE ONE-HALF TABLET BY  MOUTH DAILY   ibuprofen 200 MG tablet Commonly known as: ADVIL Take 200 mg by mouth daily.   irbesartan 75 MG tablet Commonly known as: AVAPRO TAKE 1 TABLET BY MOUTH  DAILY   levothyroxine 100 MCG tablet Commonly known as: SYNTHROID TAKE 1 TABLET BY  MOUTH ONCE DAILY   LUTEIN PO Take by mouth daily.   multivitamin tablet Take 1 tablet by mouth daily.   nystatin-triamcinolone ointment Commonly known as: MYCOLOG Apply 1 application topically 2 (two) times daily.   OMEGA-3-6-9 PO Take by mouth daily.   potassium chloride SA 20 MEQ tablet Commonly known as: KLOR-CON TAKE 1 TABLET BY MOUTH  DAILY   PROBIOTIC ACIDOPHILUS PO Take by mouth daily.   Resveratrol 250 MG Caps Take 250 mg by mouth daily.   tamsulosin 0.4 MG Caps capsule Commonly known as: FLOMAX Take 1 capsule by mouth daily.   vitamin C 500 MG tablet Commonly known as:  ASCORBIC ACID Take 1,000 mg by mouth daily.   Vitamin D3 Maximum Strength 125 MCG (5000 UT) capsule Generic drug: Cholecalciferol Take 5,000 Units by mouth daily.   vitamin E 180 MG (400 UNITS) capsule Generic drug: vitamin E Take 400 Units by mouth daily.       Review of Systems:  Review of Systems  Constitutional: Negative for activity change, fever and unexpected weight change.       Lost #5Ibs in the past 10 months  HENT: Positive for hearing loss. Negative for congestion and voice change.   Eyes: Negative for visual disturbance.  Respiratory: Negative for cough, shortness of breath and wheezing.   Cardiovascular: Positive for leg swelling.  Gastrointestinal: Negative for abdominal pain and constipation.  Genitourinary: Negative for difficulty urinating, dysuria, frequency and urgency.       No bathroom trips at night mostly  Musculoskeletal: Positive for arthralgias, back pain and gait problem.       Right leg pain if not exercise regularly  Skin: Negative for color change.  Neurological: Negative for speech difficulty, weakness, light-headedness and headaches.  Psychiatric/Behavioral: Negative for agitation, behavioral problems and sleep disturbance. The patient is not nervous/anxious.     Health Maintenance  Topic Date Due  . DEXA SCAN  05/15/2018  . INFLUENZA VACCINE  03/17/2020  . COVID-19 Vaccine (3 - Booster for Moderna series) 03/17/2020  . TETANUS/TDAP  11/12/2027  . PNA vac Low Risk Adult  Completed    Physical Exam: Vitals:   08/15/20 1620  BP: 100/60  Pulse: 68  Resp: 16  Temp: 97.7 F (36.5 C)  SpO2: 98%   There is no height or weight on file to calculate BMI. Physical Exam Vitals reviewed.  Constitutional:      General: He is not in acute distress.    Appearance: He is not ill-appearing.  HENT:     Head: Normocephalic and atraumatic.     Mouth/Throat:     Mouth: Mucous membranes are moist.  Eyes:     Extraocular Movements: Extraocular  movements intact.     Conjunctiva/sclera: Conjunctivae normal.     Pupils: Pupils are equal, round, and reactive to light.  Neck:     Vascular: No carotid bruit.  Cardiovascular:     Rate and Rhythm: Normal rate and regular rhythm.     Pulses:          Dorsalis pedis pulses are 1+ on the right side and 1+ on the left side.     Heart sounds: No murmur heard.   Pulmonary:     Breath sounds: No wheezing.  Abdominal:     General: There is no distension.     Palpations: Abdomen is soft.     Tenderness: There is no abdominal tenderness. There is no guarding or rebound.  Musculoskeletal:  Right lower leg: Edema present.     Left lower leg: Edema present.     Comments: Trace edema BLE. Ambulate with walker.   Lymphadenopathy:     Cervical: No cervical adenopathy.  Skin:    General: Skin is warm and dry.     Comments: Sebaceous cyst mid back. A small epidermoid cyst under umbilical button. A small umbilical hernia.    Neurological:     General: No focal deficit present.     Mental Status: He is alert. Mental status is at baseline.     Cranial Nerves: No cranial nerve deficit.     Motor: No weakness.     Coordination: Coordination normal.     Gait: Gait abnormal.     Comments: Ambulates with walker.   Psychiatric:        Mood and Affect: Mood normal.        Behavior: Behavior normal.        Thought Content: Thought content normal.        Judgment: Judgment normal.     Labs reviewed: Basic Metabolic Panel: Recent Labs    11/15/19 0837  NA 139  K 4.8  CL 106  CO2 26  GLUCOSE 90  BUN 26*  CREATININE 1.10  CALCIUM 9.3  TSH 3.98   Liver Function Tests: Recent Labs    11/15/19 0837  AST 18  ALT 10  BILITOT 0.4  PROT 5.4*   No results for input(s): LIPASE, AMYLASE in the last 8760 hours. No results for input(s): AMMONIA in the last 8760 hours. CBC: Recent Labs    11/15/19 0837 07/17/20 1223  WBC 7.9 6.5  NEUTROABS 3,737 3,712  HGB 10.4* 9.7*  HCT 30.8*  28.8*  MCV 105.1* 104.3*  PLT 243 297   Lipid Panel: No results for input(s): CHOL, HDL, LDLCALC, TRIG, CHOLHDL, LDLDIRECT in the last 8760 hours. No results found for: HGBA1C  Procedures since last visit: No results found.  Assessment/Plan  Iron deficiency anemia Anemia, Hgb 9.7, Iron 36, Vit B12 617 07/17/20, may need start Fe 2-3x/wk, update CBC/diff.    Edema BLE edema, trace, on Furosemide $RemoveBefor'20mg'pKFzjBahxkas$  qd, Kcl 47meq qd. update CMP/eGFR  Hypothyroidism Hypothyroidism, stable, on Levothyroxine 172mcg qd. TSH 3.98 11/15/19. Update TSH  BPH (benign prostatic hyperplasia) BPH, no urinary retention, on Tamsulosin 0.$RemoveBeforeD'4mg'twPBONrfVlmNEt$  qd. 03/29/20 Urology good FOS, emptying bladder well   HTN (hypertension) HTN, blood pressure is controlled on Irbesartan $RemoveBefor'75mg'vgUVyMqTggTF$  qd. ASA $Remov'81mg'SqaFKG$  qd, Bun/creat 26/1.10 11/15/19   Labs/tests ordered:  CBC/diff, CMP/eGFR, TSH   Next appt:  3-4 months with Dr. Lyndel Safe

## 2020-08-15 NOTE — Assessment & Plan Note (Addendum)
Anemia, Hgb 9.7, Iron 36, Vit B12 617 07/17/20, may need start Fe 2-3x/wk, update CBC/diff.

## 2020-08-15 NOTE — Assessment & Plan Note (Addendum)
BPH, no urinary retention, on Tamsulosin 0.4mg  qd. 03/29/20 Urology good FOS, emptying bladder well

## 2020-08-15 NOTE — Assessment & Plan Note (Signed)
HTN, blood pressure is controlled on Irbesartan 75mg  qd. ASA 81mg  qd, Bun/creat 26/1.10 11/15/19

## 2020-08-15 NOTE — Assessment & Plan Note (Signed)
Hypothyroidism, stable, on Levothyroxine qd. TSH 3.98 11/15/19. Update TSH

## 2020-08-15 NOTE — Assessment & Plan Note (Signed)
BLE edema, trace, on Furosemide 20mg qd, Kcl 20meq qd. update CMP/eGFR 

## 2020-08-19 ENCOUNTER — Encounter: Payer: Self-pay | Admitting: Nurse Practitioner

## 2020-08-20 ENCOUNTER — Other Ambulatory Visit: Payer: Self-pay

## 2020-10-03 ENCOUNTER — Other Ambulatory Visit: Payer: Self-pay

## 2020-10-03 ENCOUNTER — Emergency Department (HOSPITAL_COMMUNITY): Payer: Medicare Other

## 2020-10-03 ENCOUNTER — Encounter (HOSPITAL_COMMUNITY): Payer: Self-pay

## 2020-10-03 ENCOUNTER — Inpatient Hospital Stay (HOSPITAL_COMMUNITY): Payer: Medicare Other

## 2020-10-03 ENCOUNTER — Inpatient Hospital Stay (HOSPITAL_COMMUNITY)
Admission: EM | Admit: 2020-10-03 | Discharge: 2020-10-15 | DRG: 871 | Disposition: E | Payer: Medicare Other | Source: Skilled Nursing Facility | Attending: Student | Admitting: Student

## 2020-10-03 DIAGNOSIS — Z79899 Other long term (current) drug therapy: Secondary | ICD-10-CM

## 2020-10-03 DIAGNOSIS — R739 Hyperglycemia, unspecified: Secondary | ICD-10-CM | POA: Diagnosis not present

## 2020-10-03 DIAGNOSIS — Z7982 Long term (current) use of aspirin: Secondary | ICD-10-CM | POA: Diagnosis not present

## 2020-10-03 DIAGNOSIS — R6520 Severe sepsis without septic shock: Secondary | ICD-10-CM | POA: Diagnosis present

## 2020-10-03 DIAGNOSIS — J384 Edema of larynx: Secondary | ICD-10-CM | POA: Diagnosis present

## 2020-10-03 DIAGNOSIS — E872 Acidosis: Secondary | ICD-10-CM | POA: Diagnosis present

## 2020-10-03 DIAGNOSIS — J3802 Paralysis of vocal cords and larynx, bilateral: Secondary | ICD-10-CM | POA: Diagnosis present

## 2020-10-03 DIAGNOSIS — J9601 Acute respiratory failure with hypoxia: Secondary | ICD-10-CM | POA: Diagnosis present

## 2020-10-03 DIAGNOSIS — Z79890 Hormone replacement therapy (postmenopausal): Secondary | ICD-10-CM

## 2020-10-03 DIAGNOSIS — T380X5A Adverse effect of glucocorticoids and synthetic analogues, initial encounter: Secondary | ICD-10-CM | POA: Diagnosis not present

## 2020-10-03 DIAGNOSIS — D72829 Elevated white blood cell count, unspecified: Secondary | ICD-10-CM | POA: Diagnosis not present

## 2020-10-03 DIAGNOSIS — N400 Enlarged prostate without lower urinary tract symptoms: Secondary | ICD-10-CM | POA: Diagnosis present

## 2020-10-03 DIAGNOSIS — R0989 Other specified symptoms and signs involving the circulatory and respiratory systems: Secondary | ICD-10-CM | POA: Diagnosis present

## 2020-10-03 DIAGNOSIS — Z66 Do not resuscitate: Secondary | ICD-10-CM | POA: Diagnosis present

## 2020-10-03 DIAGNOSIS — M48061 Spinal stenosis, lumbar region without neurogenic claudication: Secondary | ICD-10-CM | POA: Diagnosis present

## 2020-10-03 DIAGNOSIS — I5021 Acute systolic (congestive) heart failure: Secondary | ICD-10-CM | POA: Diagnosis present

## 2020-10-03 DIAGNOSIS — J189 Pneumonia, unspecified organism: Secondary | ICD-10-CM | POA: Diagnosis present

## 2020-10-03 DIAGNOSIS — M5134 Other intervertebral disc degeneration, thoracic region: Secondary | ICD-10-CM | POA: Diagnosis present

## 2020-10-03 DIAGNOSIS — R061 Stridor: Secondary | ICD-10-CM | POA: Diagnosis not present

## 2020-10-03 DIAGNOSIS — J690 Pneumonitis due to inhalation of food and vomit: Secondary | ICD-10-CM | POA: Diagnosis present

## 2020-10-03 DIAGNOSIS — Z87891 Personal history of nicotine dependence: Secondary | ICD-10-CM | POA: Diagnosis not present

## 2020-10-03 DIAGNOSIS — D6489 Other specified anemias: Secondary | ICD-10-CM | POA: Diagnosis present

## 2020-10-03 DIAGNOSIS — Z20822 Contact with and (suspected) exposure to covid-19: Secondary | ICD-10-CM | POA: Diagnosis present

## 2020-10-03 DIAGNOSIS — R0600 Dyspnea, unspecified: Secondary | ICD-10-CM | POA: Diagnosis not present

## 2020-10-03 DIAGNOSIS — J9690 Respiratory failure, unspecified, unspecified whether with hypoxia or hypercapnia: Secondary | ICD-10-CM

## 2020-10-03 DIAGNOSIS — K2289 Other specified disease of esophagus: Secondary | ICD-10-CM | POA: Diagnosis present

## 2020-10-03 DIAGNOSIS — Z515 Encounter for palliative care: Secondary | ICD-10-CM | POA: Diagnosis not present

## 2020-10-03 DIAGNOSIS — M810 Age-related osteoporosis without current pathological fracture: Secondary | ICD-10-CM | POA: Diagnosis present

## 2020-10-03 DIAGNOSIS — E039 Hypothyroidism, unspecified: Secondary | ICD-10-CM | POA: Diagnosis present

## 2020-10-03 DIAGNOSIS — Z7189 Other specified counseling: Secondary | ICD-10-CM | POA: Diagnosis not present

## 2020-10-03 DIAGNOSIS — A419 Sepsis, unspecified organism: Principal | ICD-10-CM | POA: Diagnosis present

## 2020-10-03 DIAGNOSIS — I110 Hypertensive heart disease with heart failure: Secondary | ICD-10-CM | POA: Diagnosis present

## 2020-10-03 DIAGNOSIS — J9602 Acute respiratory failure with hypercapnia: Secondary | ICD-10-CM | POA: Diagnosis present

## 2020-10-03 LAB — CBC WITH DIFFERENTIAL/PLATELET
Abs Immature Granulocytes: 0.1 10*3/uL — ABNORMAL HIGH (ref 0.00–0.07)
Basophils Absolute: 0 10*3/uL (ref 0.0–0.1)
Basophils Relative: 0 %
Eosinophils Absolute: 0 10*3/uL (ref 0.0–0.5)
Eosinophils Relative: 0 %
HCT: 33.7 % — ABNORMAL LOW (ref 39.0–52.0)
Hemoglobin: 10.9 g/dL — ABNORMAL LOW (ref 13.0–17.0)
Immature Granulocytes: 1 %
Lymphocytes Relative: 4 %
Lymphs Abs: 0.8 10*3/uL (ref 0.7–4.0)
MCH: 34.7 pg — ABNORMAL HIGH (ref 26.0–34.0)
MCHC: 32.3 g/dL (ref 30.0–36.0)
MCV: 107.3 fL — ABNORMAL HIGH (ref 80.0–100.0)
Monocytes Absolute: 1.5 10*3/uL — ABNORMAL HIGH (ref 0.1–1.0)
Monocytes Relative: 8 %
Neutro Abs: 16.7 10*3/uL — ABNORMAL HIGH (ref 1.7–7.7)
Neutrophils Relative %: 87 %
Platelets: 337 10*3/uL (ref 150–400)
RBC: 3.14 MIL/uL — ABNORMAL LOW (ref 4.22–5.81)
RDW: 13.2 % (ref 11.5–15.5)
WBC: 19.1 10*3/uL — ABNORMAL HIGH (ref 4.0–10.5)
nRBC: 0 % (ref 0.0–0.2)

## 2020-10-03 LAB — BASIC METABOLIC PANEL
Anion gap: 10 (ref 5–15)
BUN: 25 mg/dL — ABNORMAL HIGH (ref 8–23)
CO2: 26 mmol/L (ref 22–32)
Calcium: 9.7 mg/dL (ref 8.9–10.3)
Chloride: 101 mmol/L (ref 98–111)
Creatinine, Ser: 0.91 mg/dL (ref 0.61–1.24)
GFR, Estimated: >60 mL/min (ref >60)
Glucose, Bld: 198 mg/dL — ABNORMAL HIGH (ref 70–99)
Potassium: 4 mmol/L (ref 3.5–5.1)
Sodium: 137 mmol/L (ref 135–145)

## 2020-10-03 LAB — LACTIC ACID, PLASMA: Lactic Acid, Venous: 1.8 mmol/L (ref 0.5–1.9)

## 2020-10-03 LAB — RESP PANEL BY RT-PCR (FLU A&B, COVID) ARPGX2
Influenza A by PCR: NEGATIVE
Influenza B by PCR: NEGATIVE
SARS Coronavirus 2 by RT PCR: NEGATIVE

## 2020-10-03 LAB — I-STAT CHEM 8, ED
BUN: 26 mg/dL — ABNORMAL HIGH (ref 8–23)
Calcium, Ion: 1.31 mmol/L (ref 1.15–1.40)
Chloride: 101 mmol/L (ref 98–111)
Creatinine, Ser: 1 mg/dL (ref 0.61–1.24)
Glucose, Bld: 197 mg/dL — ABNORMAL HIGH (ref 70–99)
HCT: 33 % — ABNORMAL LOW (ref 39.0–52.0)
Hemoglobin: 11.2 g/dL — ABNORMAL LOW (ref 13.0–17.0)
Potassium: 3.9 mmol/L (ref 3.5–5.1)
Sodium: 136 mmol/L (ref 135–145)
TCO2: 27 mmol/L (ref 22–32)

## 2020-10-03 MED ORDER — SODIUM CHLORIDE 0.9 % IV SOLN: 500.0000 mg | INTRAVENOUS | Status: DC

## 2020-10-03 MED ORDER — ACETAMINOPHEN 325 MG PO TABS: 650.0000 mg | ORAL_TABLET | Freq: Four times a day (QID) | ORAL | Status: DC | PRN

## 2020-10-03 MED ORDER — SODIUM CHLORIDE 0.9 % IV SOLN: INTRAVENOUS | Status: DC

## 2020-10-03 MED ORDER — SODIUM CHLORIDE 3 % IN NEBU
4.0000 mL | INHALATION_SOLUTION | Freq: Every day | RESPIRATORY_TRACT | Status: DC
  Administered 2020-10-03 – 2020-10-04 (×2): 4 mL via RESPIRATORY_TRACT
  Filled 2020-10-03 (×3): qty 4

## 2020-10-03 MED ORDER — IOHEXOL 300 MG/ML  SOLN
75.0000 mL | Freq: Once | INTRAMUSCULAR | Status: AC | PRN
  Administered 2020-10-03: 75 mL via INTRAVENOUS

## 2020-10-03 MED ORDER — ACETAMINOPHEN 650 MG RE SUPP: 650.0000 mg | Freq: Four times a day (QID) | RECTAL | Status: DC | PRN

## 2020-10-03 MED ORDER — TAMSULOSIN HCL 0.4 MG PO CAPS: 0.4000 mg | ORAL_CAPSULE | Freq: Every day | ORAL | Status: DC

## 2020-10-03 MED ORDER — SODIUM CHLORIDE 0.9 % IV SOLN
2.0000 g | INTRAVENOUS | Status: DC
  Filled 2020-10-03: qty 20

## 2020-10-03 MED ORDER — ENOXAPARIN SODIUM 40 MG/0.4ML ~~LOC~~ SOLN
40.0000 mg | SUBCUTANEOUS | Status: DC
  Filled 2020-10-03: qty 0.4

## 2020-10-03 MED ORDER — PANTOPRAZOLE SODIUM 40 MG IV SOLR
40.0000 mg | INTRAVENOUS | Status: DC
  Administered 2020-10-04: 40 mg via INTRAVENOUS
  Filled 2020-10-03: qty 40

## 2020-10-03 MED ORDER — LEVOTHYROXINE SODIUM 100 MCG PO TABS: 100.0000 ug | ORAL_TABLET | Freq: Every day | ORAL | Status: DC

## 2020-10-03 MED ORDER — CHLORHEXIDINE GLUCONATE CLOTH 2 % EX PADS
6.0000 | MEDICATED_PAD | Freq: Every day | CUTANEOUS | Status: DC
  Administered 2020-10-04: 6 via TOPICAL

## 2020-10-03 MED ORDER — SODIUM CHLORIDE 0.9 % IV SOLN
500.0000 mg | Freq: Once | INTRAVENOUS | Status: AC
  Administered 2020-10-03: 500 mg via INTRAVENOUS
  Filled 2020-10-03: qty 500

## 2020-10-03 MED ORDER — RACEPINEPHRINE HCL 2.25 % IN NEBU
0.5000 mL | INHALATION_SOLUTION | Freq: Once | RESPIRATORY_TRACT | Status: AC
  Administered 2020-10-03: 0.5 mL via RESPIRATORY_TRACT
  Filled 2020-10-03: qty 0.5

## 2020-10-03 MED ORDER — SODIUM CHLORIDE 0.9 % IV SOLN
2.0000 g | Freq: Once | INTRAVENOUS | Status: AC
  Administered 2020-10-03: 2 g via INTRAVENOUS
  Filled 2020-10-03: qty 20

## 2020-10-03 MED ORDER — FINASTERIDE 5 MG PO TABS: 5.0000 mg | ORAL_TABLET | Freq: Every day | ORAL | Status: DC

## 2020-10-03 MED ORDER — DEXAMETHASONE SODIUM PHOSPHATE 10 MG/ML IJ SOLN
6.0000 mg | Freq: Once | INTRAMUSCULAR | Status: AC
  Administered 2020-10-04: 6 mg via INTRAVENOUS
  Filled 2020-10-03: qty 1

## 2020-10-03 NOTE — ED Notes (Signed)
Daughter at bedside.

## 2020-10-03 NOTE — Progress Notes (Signed)
eLink Physician-Brief Progress Note Patient Name: Cory Burton DOB: 04/21/1918 MRN: [id number]   Date of Service  09/26/2020  HPI/Events of Note  Esophageal dysmotility in a 87 year old man, with unremarkable CT soft tissue of the neck for structural abnormalities or obstruction. 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  However, given daughter's history of a somewhat abrupt onset, albeit against the background of progressive hoarseness in recent times, I will obtain a CT head to r/o a subtle CVA. I will also order a Racemic epinephrine breathing treatment because it subjectively appeared to improve things in the ED. Finally, I will order 1 dose of 6 mg of Decadron iv.  eICU Interventions  See above. New Patient Evaluation completed.        Sukanya Goldblatt U Jonathin Heinicke 09/17/2020, 10:00 PM

## 2020-10-03 NOTE — Consult Note (Signed)
NAME:  Cory Burton, MRN:  [id number], DOB:  October 04, 1917, LOS: 0 ADMISSION DATE:  10/08/2020, CONSULTATION DATE: 10/02/2020 REFERRING MD: Dr. Marlowe Sax, CHIEF COMPLAINT: Stridor  Brief History:  87 year old male that presented with stridor today.  History of Present Illness:  This is a 87 year old white male that presents to the emergency room from home.  His daughter states that over the past 3 weeks he has had increasing hoarseness in his voice.  Today he reportedly awoke feeling fine and went to his bridge group.  During that time he developed stridorous air movement but denies feeling short of breath.  He was seen by the community nurse and sent to the emergency room.  Patient has had postprandial throat clearing and a persistent throat cough.  No diagnosis of GERD.   Though this patient is 87 years old he lives independently.[name]  He cooks 2 out of 3 meals a day.  Utilizes a walker for ambulation.  Plays bridge 2-3 times a week.  Past Medical History:  Hypertension BPH Hypothyroidism Bilateral lower extremity edema Anemia Cardiomegaly Osteoporosis Unspecified hearing loss   Consults:  Critical care  Procedures:  None  Significant Diagnostic Tests:  CT head neck: No sign of foreign object. No evidence of mass or inflammatory disease. The upper thoracic esophagus is somewhat dilated and fluid-filled. Commonly, patients with presbyesophagus can have the sensation something stuck in the throat  Micro Data:  Influenza negative Covid: Negative  Antimicrobials:  Azithromycin Rocephin  Objective   Blood pressure (!) 143/88, pulse (!) 119, temperature 99.2 F (37.3 C), temperature source Oral, resp. rate (!) 23, height 5\' 5"  (1.651 m), weight 59 kg, SpO2 95 %.        Intake/Output Summary (Last 24 hours) at 10/02/2020 2144 Last data filed at 10/04/2020 2018 Gross per 24 hour  Intake 100 ml  Output --  Net 100 ml   Filed Weights   10/01/2020 1710  Weight: 59 kg     Examination: General: No acute distress HENT: Atraumatic/normocephalic mucous membranes are moist The neck is thin no adenopathy, no thyromegaly, no deformity patient does have stridor that intermittently clears with coughing.  Patient is able to move normal amount of air with deep breath and exhalation.  No stridor with forced exhalation. Lungs: Clear to auscultation no wheezing rales or rhonchi Cardiovascular: Regular rate Abdomen: Soft, nontender, nondistended, no rebound/rigidity/guarding positive bowel sounds Extremities: Trace tibial edema Neuro: Awake and alert, difficulty hearing, motor is grossly intact   Assessment & Plan:  Stridor without airway compromise Possible chronic aspiration leading to hoarseness and mucus production causing stridor. Presbyesophagus Hypertension Left lower lobe atelectasis with possible infiltrate  Plan: At this time there is no indication that the patient has any airway compromise.  CAT scan also demonstrates an open airway.  Therefore I feel the patient is safe to go to the PCU.  If at any point in time he developed respiratory distress would reevaluate for transfer to the ICU Would recommend ENT evaluation. Start Protonix IV Agree with empiric antibiotics  Updated patient's daughter at bedside.  Answered all questions.   Best practice (evaluated daily)  Diet: N.p.o.  pain/Anxiety/Delirium protocol (if indicated): None VAP protocol (if indicated): Not indicated DVT prophylaxis: Lovenox GI prophylaxis: Protonix Glucose control: Insulin sliding scale Mobility: Up with assistance Disposition: Admit to PCU  Goals of Care:   Code Status: Partial DNR Labs   CBC: Recent Labs  Lab 10/04/2020 1705 10/14/2020 1752  WBC 19.1*  --  NEUTROABS 16.7*  --   HGB 10.9* 11.2*  HCT 33.7* 33.0*  MCV 107.3*  --   PLT 337  --     Basic Metabolic Panel: Recent Labs  Lab 09/30/2020 1705 09/23/2020 1752  NA 137 136  K 4.0 3.9  CL 101 101  CO2  26  --   GLUCOSE 198* 197*  BUN 25* 26*  CREATININE 0.91 1.00  CALCIUM 9.7  --    GFR: Estimated Creatinine Clearance: 32.8 mL/min (by C-G formula based on SCr of 1 mg/dL). Recent Labs  Lab 10/08/2020 1705  WBC 19.1*    Liver Function Tests: No results for input(s): AST, ALT, ALKPHOS, BILITOT, PROT, ALBUMIN in the last 168 hours. No results for input(s): LIPASE, AMYLASE in the last 168 hours. No results for input(s): AMMONIA in the last 168 hours.  ABG    Component Value Date/Time   TCO2 27 10/11/2020 1752     Coagulation Profile: No results for input(s): INR, PROTIME in the last 168 hours.  Cardiac Enzymes: No results for input(s): CKTOTAL, CKMB, CKMBINDEX, TROPONINI in the last 168 hours.  HbA1C: No results found for: HGBA1C  CBG: No results for input(s): GLUCAP in the last 168 hours.  Review of Systems:   See HPI  Past Medical History:  He,  has a past medical history of Acute posthemorrhagic anemia (12/23/2011), Anemia (03/06/2015), BPH (benign prostatic hyperplasia), Cardiomegaly (12/24/2011), Degeneration of thoracic or thoracolumbar intervertebral disc (12/24/2011), Edema (04/28/2012), Fracture, intertrochanteric, left femur ([place]) (12/17/2011), Hypertension, Other abnormal blood chemistry (12/24/2011), Senile osteoporosis (08/25/2012), Spinal stenosis, lumbar region, without neurogenic claudication (12/24/2011), Thyroid disease, Unspecified hearing loss (12/24/2011), and Unspecified hypothyroidism (12/23/2011).   Surgical History:   Past Surgical History:  Procedure Laterality Date  . COMPRESSION HIP SCREW  12/17/2011   Procedure: COMPRESSION HIP;  Surgeon: Johnny Bridge, MD;  Location: [facility];  Service: Orthopedics;  Laterality: Left;  . FOOT SURGERY       Social History:   reports that he quit smoking about 35 years ago. His smoking use included pipe. He has never used smokeless tobacco. He reports current alcohol use of about 7.0 standard drinks of alcohol per week. He  reports that he does not use drugs.   Family History:  His family history includes Cancer in his mother; Other in his father.   Allergies Allergies  Allergen Reactions  . Clindamycin/Lincomycin     Developed C. dificile  . Horse-Derived Products     Horse serum  . Mold Extract [Trichophyton]      Home Medications  Prior to Admission medications   Medication Sig Start Date End Date Taking? Authorizing Provider  Cholecalciferol 125 MCG (5000 UT) capsule Take 5,000 Units by mouth daily.   Yes [provider]  Cyanocobalamin (B-12 PO) Take 3,000 mg by mouth daily.   Yes [provider]  feeding supplement, ENSURE ENLIVE, (ENSURE ENLIVE) LIQD Take 237 mLs by mouth 2 (two) times daily between meals. Patient taking differently: Take 237 mLs by mouth daily. Between meals 02/27/15  Yes Dhungel, Nishant, MD  finasteride (PROSCAR) 5 MG tablet Take 5 mg by mouth daily.   Yes [provider]  furosemide (LASIX) 20 MG tablet TAKE ONE-HALF TABLET BY  MOUTH DAILY Patient taking differently: Take 10 mg by mouth daily. 07/04/20  Yes Mast, Man X, NP  ibuprofen (ADVIL) 200 MG tablet Take 200 mg by mouth daily.   Yes [provider]  Lactobacillus (PROBIOTIC ACIDOPHILUS PO) Take 1 tablet  by mouth daily.   Yes [provider]  levothyroxine (SYNTHROID) 100 MCG tablet TAKE 1 TABLET BY MOUTH ONCE DAILY Patient taking differently: Take 100 mcg by mouth daily before breakfast. 07/04/20  Yes Mast, Man X, NP  LUTEIN PO Take 1 tablet by mouth daily.   Yes [provider]  Multiple Minerals-Vitamins (CAL-MAG-ZINC-D PO) Take 1 tablet by mouth daily.   Yes [provider]  nystatin-triamcinolone ointment (MYCOLOG) Apply 1 application topically 2 (two) times daily.   Yes [provider]  Omega 3-6-9 Fatty Acids (OMEGA-3-6-9 PO) Take 1 tablet by mouth daily.   Yes [provider]  potassium chloride SA (K-DUR) 20 MEQ tablet TAKE 1 TABLET  BY MOUTH  DAILY Patient taking differently: Take 20 mEq by mouth daily. 04/07/19  Yes Mast, Man X, NP  tamsulosin (FLOMAX) 0.4 MG CAPS capsule Take 1 capsule by mouth daily. 10/03/18  Yes [provider]  vitamin C (ASCORBIC ACID) 500 MG tablet Take 1,000 mg by mouth daily.    Yes [provider]  vitamin E 180 MG (400 UNITS) capsule Take 400 Units by mouth daily.   Yes [provider]  irbesartan (AVAPRO) 75 MG tablet TAKE 1 TABLET BY MOUTH  DAILY Patient not taking: No sig reported 07/04/20   Mast, Man X, NP     Critical care time: 45 minutes

## 2020-10-03 NOTE — ED Notes (Signed)
Increased pt O2 to 4L [place] d/t hypoxia. Stridor noted. MD made aware

## 2020-10-03 NOTE — H&P (Signed)
History and Physical    Cory Burton ZOX:[id number] DOB: 03/19/1918 DOA: 09/29/2020  PCP: Mast, Man X, NP Patient coming from: [facility]  Chief Complaint: Respiratory distress  HPI: Cory Burton is a 87 y.o. male with medical history significant of hypertension, BPH, hypothyroidism, cardiomegaly, iron deficiency anemia, osteoporosis, lumbar spinal stenosis, degenerative disc disease of thoracic and thoracolumbar spine presenting to the ED via EMS for evaluation of respiratory distress.  EMS noted stridor on arrival.  Patient told him that he fell like something was stuck in his throat.  EMS administered racemic epinephrine with somewhat helped but stridor persisted.  Unclear how hypoxic he was on room air, he was placed on 2 L supplemental oxygen and brought into the ED for further evaluation.  History limited as patient is hard of hearing and was not able to speak clearly in full sentences.  History provided mostly by daughter at bedside who states that patient has had hoarseness of his voice for several weeks.  Today he was okay in the morning and in the afternoon when she checked on him she noticed abnormal airway sounds.  Patient denies having any difficulty breathing.  States he feels as if there is something in his throat.  Denies chest pain.  Denies fevers or chills.  He is fully vaccinated against COVID including booster shot.  Daughter reports patient having generalized weakness and a fall 2 days ago.  Patient denies head injury at the time of the fall.  No loss of consciousness reported.  Daughter states patient complained of pulling a muscle in his neck at that time.  No other injuries reported.  ED Course: Afebrile.  Slightly tachycardic and tachypneic.  Patient noted to have upper airway sounds.  He was given hypertonic saline.  Satting well on 4 L supplemental oxygen.  WBC 19.1 with left shift, hemoglobin 10.9 (at baseline), platelet count 337K.  Sodium 137, potassium 4.0, chloride 101,  bicarb 26, BUN 25, creatinine 0.9, glucose 198.  Blood cultures pending.  SARS-CoV-2 PCR test pending.  Chest x-ray showing left lower lobe pneumonia.  CT soft tissue neck showing no sign of foreign object and no evidence of mass or inflammatory disease.  The upper thoracic esophagus is somewhat dilated and fluid-filled on CT.  No findings to suggest acute C-spine injury seen on CT. Patient was also given ceftriaxone and azithromycin.  PCCM consulted and will see the patient tonight.  Review of Systems:  All systems reviewed and apart from history of presenting illness, are negative.  Past Medical History:  Diagnosis Date  . Acute posthemorrhagic anemia 12/23/2011  . Anemia 03/06/2015  . BPH (benign prostatic hyperplasia)   . Cardiomegaly 12/24/2011  . Degeneration of thoracic or thoracolumbar intervertebral disc 12/24/2011  . Edema 04/28/2012  . Fracture, intertrochanteric, left femur ([place]) 12/17/2011  . Hypertension   . Other abnormal blood chemistry 12/24/2011  . Senile osteoporosis 08/25/2012  . Spinal stenosis, lumbar region, without neurogenic claudication 12/24/2011  . Thyroid disease   . Unspecified hearing loss 12/24/2011  . Unspecified hypothyroidism 12/23/2011    Past Surgical History:  Procedure Laterality Date  . COMPRESSION HIP SCREW  12/17/2011   Procedure: COMPRESSION HIP;  Surgeon: Johnny Bridge, MD;  Location: [facility] ORS;  Service: Orthopedics;  Laterality: Left;  . FOOT SURGERY       reports that he quit smoking about 35 years ago. His smoking use included pipe. He has never used smokeless tobacco. He reports current alcohol use of about 7.0 standard  drinks of alcohol per week. He reports that he does not use drugs.  Allergies  Allergen Reactions  . Clindamycin/Lincomycin     Developed C. dificile  . Horse-Derived Products     Horse serum  . Mold Extract [Trichophyton]     Family History  Problem Relation Age of Onset  . Cancer Mother   . Other Father        accident     Prior to Admission medications   Medication Sig Start Date End Date Taking? Authorizing Provider  aspirin 81 MG tablet Take 81 mg by mouth daily.    [provider]  Cholecalciferol (VITAMIN D3 MAXIMUM STRENGTH) 125 MCG (5000 UT) capsule Take 5,000 Units by mouth daily.    [provider]  Cyanocobalamin (B-12 PO) Take 3,000 mg by mouth daily.    [provider]  feeding supplement, ENSURE ENLIVE, (ENSURE ENLIVE) LIQD Take 237 mLs by mouth 2 (two) times daily between meals. Patient taking differently: Take 237 mLs by mouth daily. Between meals 02/27/15   Dhungel, Nishant, MD  finasteride (PROSCAR) 5 MG tablet Take 5 mg by mouth daily.    [provider]  furosemide (LASIX) 20 MG tablet TAKE ONE-HALF TABLET BY  MOUTH DAILY 07/04/20   Mast, Man X, NP  ibuprofen (ADVIL) 200 MG tablet Take 200 mg by mouth daily.    [provider]  irbesartan (AVAPRO) 75 MG tablet TAKE 1 TABLET BY MOUTH  DAILY 07/04/20   Mast, Man X, NP  Lactobacillus (PROBIOTIC ACIDOPHILUS PO) Take by mouth daily.    [provider]  levothyroxine (SYNTHROID) 100 MCG tablet TAKE 1 TABLET BY MOUTH ONCE DAILY 07/04/20   Mast, Man X, NP  LUTEIN PO Take by mouth daily.    [provider]  Multiple Minerals-Vitamins (CAL-MAG-ZINC-D PO) Take 1 tablet by mouth daily.    [provider]  Multiple Vitamin (MULTIVITAMIN) tablet Take 1 tablet by mouth daily.    [provider]  nystatin-triamcinolone ointment (MYCOLOG) Apply 1 application topically 2 (two) times daily.    [provider]  Omega 3-6-9 Fatty Acids (OMEGA-3-6-9 PO) Take by mouth daily.    [provider]  potassium chloride SA (K-DUR) 20 MEQ tablet TAKE 1 TABLET BY MOUTH  DAILY 04/07/19   Mast, Man X, NP  Resveratrol 250 MG CAPS Take 250 mg by mouth daily.    [provider]  tamsulosin (FLOMAX) 0.4 MG CAPS capsule Take 1 capsule by mouth daily. 10/03/18   [provider]  vitamin C (ASCORBIC ACID) 500 MG tablet Take 1,000 mg by mouth daily.     [provider]  vitamin E (VITAMIN E) 400 UNIT capsule Take 400 Units by mouth daily.    [provider]    Physical Exam: Vitals:   09/17/2020 1730 09/28/2020 1800 09/24/2020 2000 09/24/2020 2030  BP: (!) 157/89 140/81 (!) 153/81   Pulse: (!) 124 (!) 118 (!) 116 (!) 108  Resp: (!) 21 (!) 24 19 20   Temp:      TempSrc:      SpO2: 95% 97% 99% 99%  Weight:      Height:        Physical Exam Constitutional:      Appearance: He is not diaphoretic.  HENT:     Head: Normocephalic.  Eyes:     Extraocular Movements: Extraocular movements intact.     Conjunctiva/sclera: Conjunctivae normal.  Cardiovascular:     Rate and Rhythm: Regular  rhythm.     Pulses: Normal pulses.     Comments: Slightly tachycardic Pulmonary:     Breath sounds: No wheezing.     Comments: Slightly tachypneic Inspiratory upper airway sounds noted Abdominal:     General: Bowel sounds are normal. There is no distension.     Palpations: Abdomen is soft.     Tenderness: There is no abdominal tenderness.  Musculoskeletal:     Cervical back: Normal range of motion and neck supple.     Right lower leg: Edema present.     Left lower leg: Edema present.     Comments: Mild pedal edema bilaterally  Skin:    General: Skin is warm and dry.  Neurological:     General: No focal deficit present.     Mental Status: He is alert and oriented to person, place, and time.     Sensory: No sensory deficit.     Motor: No weakness.     Labs on Admission: I have personally reviewed following labs and imaging studies  CBC: Recent Labs  Lab 10/11/2020 1705 10/12/2020 1752  WBC 19.1*  --   NEUTROABS 16.7*  --   HGB 10.9* 11.2*  HCT 33.7* 33.0*  MCV 107.3*  --   PLT 337  --    Basic Metabolic Panel: Recent Labs  Lab 10/10/2020 1705 09/17/2020 1752  NA 137 136  K 4.0 3.9  CL 101 101  CO2 26  --   GLUCOSE 198* 197*  BUN  25* 26*  CREATININE 0.91 1.00  CALCIUM 9.7  --    GFR: Estimated Creatinine Clearance: 32.8 mL/min (by C-G formula based on SCr of 1 mg/dL). Liver Function Tests: No results for input(s): AST, ALT, ALKPHOS, BILITOT, PROT, ALBUMIN in the last 168 hours. No results for input(s): LIPASE, AMYLASE in the last 168 hours. No results for input(s): AMMONIA in the last 168 hours. Coagulation Profile: No results for input(s): INR, PROTIME in the last 168 hours. Cardiac Enzymes: No results for input(s): CKTOTAL, CKMB, CKMBINDEX, TROPONINI in the last 168 hours. BNP (last 3 results) No results for input(s): PROBNP in the last 8760 hours. HbA1C: No results for input(s): HGBA1C in the last 72 hours. CBG: No results for input(s): GLUCAP in the last 168 hours. Lipid Profile: No results for input(s): CHOL, HDL, LDLCALC, TRIG, CHOLHDL, LDLDIRECT in the last 72 hours. Thyroid Function Tests: No results for input(s): TSH, T4TOTAL, FREET4, T3FREE, THYROIDAB in the last 72 hours. Anemia Panel: No results for input(s): VITAMINB12, FOLATE, FERRITIN, TIBC, IRON, RETICCTPCT in the last 72 hours. Urine analysis:    Component Value Date/Time   COLORURINE YELLOW 12/17/2011 0154   APPEARANCEUR Clear 02/20/2015 1247   LABSPEC 1.018 12/17/2011 0154   PHURINE 7.0 12/17/2011 0154   GLUCOSEU Negative 02/20/2015 1247   HGBUR NEGATIVE 12/17/2011 0154   BILIRUBINUR Negative 02/20/2015 1247   KETONESUR TRACE (A) 12/17/2011 0154   PROTEINUR Trace 02/20/2015 1247   PROTEINUR NEGATIVE 12/17/2011 0154   UROBILINOGEN 0.2 12/17/2011 0154   NITRITE Negative 02/20/2015 1247   NITRITE NEGATIVE 12/17/2011 0154   LEUKOCYTESUR Negative 02/20/2015 1247    Radiological Exams on Admission: CT Soft Tissue Neck W Contrast  Result Date: 09/28/2020 CLINICAL DATA:  Sensation of something stuck in the throat. Question epiglottitis or tonsillitis. EXAM: CT NECK WITH CONTRAST TECHNIQUE: Multidetector CT imaging of the neck was  performed using the standard protocol following the bolus administration of intravenous contrast. CONTRAST:  3mL OMNIPAQUE IOHEXOL 300 MG/ML  SOLN COMPARISON:  None. FINDINGS: Pharynx and larynx: No evidence of mucosal or submucosal mass or inflammatory disease. No sign of any foreign object. The upper thoracic esophagus is somewhat dilated and fluid-filled. Commonly, patients with presbyesophagus can have the sensation something stuck in the throat. Salivary glands: Parotid and submandibular glands are normal. Thyroid: Normal Lymph nodes: No adenopathy. Vascular: Ordinary atherosclerotic calcification at the carotid bifurcations. Limited intracranial: No significant finding. Visualized orbits: Normal Mastoids and visualized paranasal sinuses: Clear Skeleton: Ordinary cervical and thoracic spondylosis. No advanced or acute finding. Upper chest: Minimal scarring.  No active process. Other: None IMPRESSION: No sign of foreign object. No evidence of mass or inflammatory disease. The upper thoracic esophagus is somewhat dilated and fluid-filled. Commonly, patients with presbyesophagus can have the sensation something stuck in the throat. Electronically Signed   By: Nelson Chimes M.D.   On: 09/28/2020 18:43   DG Chest Port 1 View  Result Date: 09/29/2020 CLINICAL DATA:  Feels like something is stuck in throat, stridor EXAM: PORTABLE CHEST 1 VIEW COMPARISON:  Portable exam 1830 hours compared to 12/17/2011 FINDINGS: Enlargement of cardiac silhouette. Mediastinal contours and pulmonary vascularity normal. Atherosclerotic calcification aorta. LEFT lower lobe infiltrate consistent with pneumonia. Underlying chronic lung changes again identified. No pleural effusion or pneumothorax. Bones demineralized. IMPRESSION: Enlargement of cardiac silhouette. Chronic lung disease with LEFT lower lobe pneumonia. Electronically Signed   By: Lavonia Dana M.D.   On: 09/25/2020 18:36    Assessment/Plan Principal Problem:   Abnormal  chest sounds Active Problems:   BPH (benign prostatic hyperplasia)   Hypothyroidism   Pneumonia   Sepsis (HCC)  Inspiratory upper airway sounds: Patient noted to have inspiratory upper airway sounds but not high-pitched as would be expected with stridor.  He was given racemic epinephrine by EMS and hypertonic saline in the ED which helped only somewhat.  Continues to have inspiratory upper airway sounds and not able to speak clearly in full sentences.  No wheezing on auscultation of the lungs.  Slightly tachycardic and tachypneic, however, satting in the upper 90s on 4 L supplemental oxygen. CT soft tissue neck showing no sign of foreign object and no evidence of mass or inflammatory disease. -PCCM team consulted by ED physician and will see the patient tonight, appreciate assistance.  Continuous pulse ox.  Continue supplemental oxygen.  Keep n.p.o. at this time.  Sepsis secondary to lobar pneumonia: Slightly tachycardic and tachypneic.  Blood pressure stable.  WBC count 19.1 with left shift. Chest x-ray showing left lower lobe pneumonia. -Continue ceftriaxone and azithromycin.  IV fluid hydration.  Blood culture x2 pending.  Check lactic acid level.  Continue to monitor WBC count.  SARS-CoV-2 PCR test pending, patient is fully vaccinated.  Acute hypoxemic respiratory failure: Likely due to problems listed above.  Patient currently satting well on 4 L supplemental oxygen, unclear how low his sats were with EMS. -Continuous pulse ox, continue supplemental oxygen  Goals of care discussion: Daughter states that patient is DNR but would want to be intubated if his respiratory status declined.  Stated he would not want CPR if he went into cardiac arrest.  Patient did not want to discuss his CODE STATUS.  Stated his breathing is fine and he felt that this discussion was irrelevant.  Generalized weakness, recent fall: Likely due to underlying infection/pneumonia.  No focal motor or sensory deficit on  exam. -PT/OT, fall precautions  BPH -Continue finasteride and tamsulosin  Hypothyroidism -Continue Synthroid  DVT prophylaxis: Lovenox Code Status:  Limited code as mentioned above.  Family Communication: Daughter at bedside. Disposition Plan: Status is: Inpatient  Remains inpatient appropriate because:IV treatments appropriate due to intensity of illness or inability to take PO and Inpatient level of care appropriate due to severity of illness   Dispo: The patient is from: SNF              Anticipated d/c is to: SNF              Anticipated d/c date is: 3 days              Patient currently is not medically stable to d/c.   Difficult to place patient No  Consults called: PCCM Level of care: Level of care: Stepdown   The medical decision making on this patient was of high complexity and the patient is at high risk for clinical deterioration, therefore this is a level 3 visit.  Shela Leff MD [facility]  If 7PM-7AM, please contact night-coverage [email or web address]  10/06/2020, 8:46 PM

## 2020-10-03 NOTE — ED Provider Notes (Signed)
[facility] DEPT Provider Note   CSN: [id number] Arrival date & time: 09/18/2020  1652     History Chief Complaint  Patient presents with  . Respiratory Distress    Cory Burton is a 87 y.o. male.  87 yo M with a chief complaints of an inspiratory noise. Felt to be stridor at the nursing home there is a call out to EMS who brought him here for evaluation. He was given racemic epi in route with some reported improvement. Patient thinks he feels fine feels like something might be stuck in his throat. He thinks most likely it is phlegm. Tells me has had chronic issues with phlegm. He denies any chest pain denies headaches denies esophageal foreign body. He denies significant cough. He was told at some point that he is a large epiglottis. Denies history of cancer. Remote smoking history.   The history is provided by the patient.  Illness Severity:  Moderate Onset quality:  Gradual Duration:  2 days Timing:  Constant Progression:  Worsening Chronicity:  New Associated symptoms: no abdominal pain, no chest pain, no congestion, no diarrhea, no fever, no headaches, no myalgias, no rash, no shortness of breath and no vomiting        Past Medical History:  Diagnosis Date  . Acute posthemorrhagic anemia 12/23/2011  . Anemia 03/06/2015  . BPH (benign prostatic hyperplasia)   . Cardiomegaly 12/24/2011  . Degeneration of thoracic or thoracolumbar intervertebral disc 12/24/2011  . Edema 04/28/2012  . Fracture, intertrochanteric, left femur ([place]) 12/17/2011  . Hypertension   . Other abnormal blood chemistry 12/24/2011  . Senile osteoporosis 08/25/2012  . Spinal stenosis, lumbar region, without neurogenic claudication 12/24/2011  . Thyroid disease   . Unspecified hearing loss 12/24/2011  . Unspecified hypothyroidism 12/23/2011    Patient Active Problem List   Diagnosis Date Noted  . Pneumonia 10/06/2020  . Iron deficiency anemia 08/15/2020  . Sebaceous cyst 05/30/2018  .  Memory deficit 04/07/2018  . Numbness of left hand 04/07/2018  . PVD (peripheral vascular disease) ([place]) 12/07/2017  . History of skin cancer 03/09/2017  . Shoulder pain, left 12/02/2016  . Abnormality of gait 10/24/2015  . Grief 06/11/2015  . Anemia 03/06/2015  . Hypokalemia   . Osteoporosis 10/25/2014  . Sciatica of right side 04/26/2014  . HTN (hypertension) 04/28/2013  . Edema 04/28/2013  . BPH (benign prostatic hyperplasia) 04/28/2013  . Hypothyroidism 04/28/2013  . Fracture, intertrochanteric, left femur ([place]) 12/17/2011    Past Surgical History:  Procedure Laterality Date  . COMPRESSION HIP SCREW  12/17/2011   Procedure: COMPRESSION HIP;  Surgeon: Johnny Bridge, MD;  Location: [facility];  Service: Orthopedics;  Laterality: Left;  . FOOT SURGERY         Family History  Problem Relation Age of Onset  . Cancer Mother   . Other Father        accident    Social History   Tobacco Use  . Smoking status: Former Smoker    Types: Pipe    Quit date: 04/27/1985    Years since quitting: 35.4  . Smokeless tobacco: Never Used  Vaping Use  . Vaping Use: Never used  Substance Use Topics  . Alcohol use: Yes    Alcohol/week: 7.0 standard drinks    Types: 7 Glasses of wine per week    Comment: 50 oz a week  . Drug use: No    Home Medications Prior to Admission medications   Medication Sig Start  Date End Date Taking? Authorizing Provider  Cholecalciferol 125 MCG (5000 UT) capsule Take 5,000 Units by mouth daily.   Yes [provider]  Cyanocobalamin (B-12 PO) Take 3,000 mg by mouth daily.   Yes [provider]  feeding supplement, ENSURE ENLIVE, (ENSURE ENLIVE) LIQD Take 237 mLs by mouth 2 (two) times daily between meals. Patient taking differently: Take 237 mLs by mouth daily. Between meals 02/27/15  Yes Dhungel, Nishant, MD  finasteride (PROSCAR) 5 MG tablet Take 5 mg by mouth daily.   Yes [provider]  furosemide (LASIX) 20 MG tablet TAKE  ONE-HALF TABLET BY  MOUTH DAILY Patient taking differently: Take 10 mg by mouth daily. 07/04/20  Yes Mast, Man X, NP  ibuprofen (ADVIL) 200 MG tablet Take 200 mg by mouth daily.   Yes [provider]  Lactobacillus (PROBIOTIC ACIDOPHILUS PO) Take 1 tablet by mouth daily.   Yes [provider]  levothyroxine (SYNTHROID) 100 MCG tablet TAKE 1 TABLET BY MOUTH ONCE DAILY Patient taking differently: Take 100 mcg by mouth daily before breakfast. 07/04/20  Yes Mast, Man X, NP  LUTEIN PO Take 1 tablet by mouth daily.   Yes [provider]  Multiple Minerals-Vitamins (CAL-MAG-ZINC-D PO) Take 1 tablet by mouth daily.   Yes [provider]  nystatin-triamcinolone ointment (MYCOLOG) Apply 1 application topically 2 (two) times daily.   Yes [provider]  Omega 3-6-9 Fatty Acids (OMEGA-3-6-9 PO) Take 1 tablet by mouth daily.   Yes [provider]  potassium chloride SA (K-DUR) 20 MEQ tablet TAKE 1 TABLET BY MOUTH  DAILY Patient taking differently: Take 20 mEq by mouth daily. 04/07/19  Yes Mast, Man X, NP  tamsulosin (FLOMAX) 0.4 MG CAPS capsule Take 1 capsule by mouth daily. 10/03/18  Yes [provider]  vitamin C (ASCORBIC ACID) 500 MG tablet Take 1,000 mg by mouth daily.    Yes [provider]  vitamin E 180 MG (400 UNITS) capsule Take 400 Units by mouth daily.   Yes [provider]  irbesartan (AVAPRO) 75 MG tablet TAKE 1 TABLET BY MOUTH  DAILY Patient not taking: No sig reported 07/04/20   Mast, Man X, NP    Allergies    Clindamycin/lincomycin, Horse-derived products, and Mold extract [trichophyton]  Review of Systems   Review of Systems  Constitutional: Negative for chills and fever.  HENT: Negative for congestion and facial swelling.   Eyes: Negative for discharge and visual disturbance.  Respiratory: Positive for stridor. Negative for shortness of breath.   Cardiovascular: Negative for chest pain and palpitations.   Gastrointestinal: Negative for abdominal pain, diarrhea and vomiting.  Musculoskeletal: Negative for arthralgias and myalgias.  Skin: Negative for color change and rash.  Neurological: Negative for tremors, syncope and headaches.  Psychiatric/Behavioral: Negative for confusion and dysphoric mood.    Physical Exam Updated Vital Signs BP (!) 153/81   Pulse (!) 116   Temp 99.2 F (37.3 C) (Oral)   Resp 19   Ht 5\' 5"  (1.651 m)   Wt 59 kg   SpO2 99%   BMI 21.63 kg/m   Physical Exam Vitals and nursing note reviewed.  Constitutional:      Appearance: He is well-developed and well-nourished.  HENT:     Head: Normocephalic and atraumatic.     Nose: Congestion present.     Mouth/Throat:     Comments: Clear appearing mucus in the posterior oropharynx. No tonsillar swelling or exudates. Tolerating secretions. No obvious intraoral pathology.  Able to range his neck without pain. No palpable mass to the anterior aspect of his neck with the exception on the left side he has what is likely a sebaceous cyst. Eyes:     Extraocular Movements: EOM normal.     Pupils: Pupils are equal, round, and reactive to light.  Neck:     Vascular: No JVD.  Cardiovascular:     Rate and Rhythm: Normal rate and regular rhythm.     Heart sounds: No murmur heard. No friction rub. No gallop.   Pulmonary:     Effort: No respiratory distress.     Breath sounds: No wheezing.     Comments: Coarse breath sounds in all fields versus transmitted upper airway noises. Abdominal:     General: There is no distension.     Tenderness: There is no abdominal tenderness. There is no guarding or rebound.  Musculoskeletal:        General: Normal range of motion.     Cervical back: Normal range of motion and neck supple.  Skin:    Coloration: Skin is not pale.     Findings: No rash.  Neurological:     Mental Status: He is alert and oriented to person, place, and time.  Psychiatric:        Mood and Affect: Mood and  affect normal.        Behavior: Behavior normal.     ED Results / Procedures / Treatments   Labs (all labs ordered are listed, but only abnormal results are displayed) Labs Reviewed  CBC WITH DIFFERENTIAL/PLATELET - Abnormal; Notable for the following components:      Result Value   WBC 19.1 (*)    RBC 3.14 (*)    Hemoglobin 10.9 (*)    HCT 33.7 (*)    MCV 107.3 (*)    MCH 34.7 (*)    Neutro Abs 16.7 (*)    Monocytes Absolute 1.5 (*)    Abs Immature Granulocytes 0.10 (*)    All other components within normal limits  BASIC METABOLIC PANEL - Abnormal; Notable for the following components:   Glucose, Bld 198 (*)    BUN 25 (*)    All other components within normal limits  I-STAT CHEM 8, ED - Abnormal; Notable for the following components:   BUN 26 (*)    Glucose, Bld 197 (*)    Hemoglobin 11.2 (*)    HCT 33.0 (*)    All other components within normal limits  RESP PANEL BY RT-PCR (FLU A&B, COVID) ARPGX2  CULTURE, BLOOD (ROUTINE X 2)  CULTURE, BLOOD (ROUTINE X 2)    EKG None  Radiology CT Soft Tissue Neck W Contrast  Result Date: 10/04/2020 CLINICAL DATA:  Sensation of something stuck in the throat. Question epiglottitis or tonsillitis. EXAM: CT NECK WITH CONTRAST TECHNIQUE: Multidetector CT imaging of the neck was performed using the standard protocol following the bolus administration of intravenous contrast. CONTRAST:  54mL OMNIPAQUE IOHEXOL 300 MG/ML  SOLN COMPARISON:  None. FINDINGS: Pharynx and larynx: No evidence of mucosal or submucosal mass or inflammatory disease. No sign of any foreign object. The upper thoracic esophagus is somewhat dilated and fluid-filled. Commonly, patients with presbyesophagus can have the sensation something stuck in the throat. Salivary glands: Parotid and submandibular glands are normal. Thyroid: Normal Lymph nodes: No adenopathy. Vascular: Ordinary atherosclerotic calcification at the carotid bifurcations. Limited intracranial: No significant  finding. Visualized orbits: Normal Mastoids and visualized paranasal sinuses: Clear Skeleton: Ordinary cervical and  thoracic spondylosis. No advanced or acute finding. Upper chest: Minimal scarring.  No active process. Other: None IMPRESSION: No sign of foreign object. No evidence of mass or inflammatory disease. The upper thoracic esophagus is somewhat dilated and fluid-filled. Commonly, patients with presbyesophagus can have the sensation something stuck in the throat. Electronically Signed   By: Nelson Chimes M.D.   On: 10/12/2020 18:43   DG Chest Port 1 View  Result Date: 09/27/2020 CLINICAL DATA:  Feels like something is stuck in throat, stridor EXAM: PORTABLE CHEST 1 VIEW COMPARISON:  Portable exam 1830 hours compared to 12/17/2011 FINDINGS: Enlargement of cardiac silhouette. Mediastinal contours and pulmonary vascularity normal. Atherosclerotic calcification aorta. LEFT lower lobe infiltrate consistent with pneumonia. Underlying chronic lung changes again identified. No pleural effusion or pneumothorax. Bones demineralized. IMPRESSION: Enlargement of cardiac silhouette. Chronic lung disease with LEFT lower lobe pneumonia. Electronically Signed   By: Lavonia Dana M.D.   On: 10/10/2020 18:36    Procedures Procedures   Medications Ordered in ED Medications  sodium chloride HYPERTONIC 3 % nebulizer solution 4 mL (4 mLs Nebulization Given 09/18/2020 1956)  azithromycin (ZITHROMAX) 500 mg in sodium chloride 0.9 % 250 mL IVPB (500 mg Intravenous New Bag/Given 10/13/2020 2011)  iohexol (OMNIPAQUE) 300 MG/ML solution 75 mL (75 mLs Intravenous Contrast Given 10/04/2020 1814)  cefTRIAXone (ROCEPHIN) 2 g in sodium chloride 0.9 % 100 mL IVPB (0 g Intravenous Stopped 09/20/2020 2018)    ED Course  I have reviewed the triage vital signs and the nursing notes.  Pertinent labs & imaging results that were available during my care of the patient were reviewed by me and considered in my medical decision making (see chart  for details).    MDM Rules/Calculators/A&P                          87 yo M with a chief complaints of upper airway noises worse on inspiration. Thought to be stridor by nursing facility and EMS. Patient in no distress on arrival. Tells me that he has chronic congestion and that is the cause of his symptoms. Will obtain a chest x-ray. Give a hypertonic saline neb. CT scan of the soft tissues of the neck.  CT scan of the soft tissue of the neck without acute pathology. Chest x-ray concerning for left-sided pneumonia. Will start on antibiotics. Will discuss with medicine for admission.   I did discuss the case with critical care who will come and evaluate the patient for his upper airway noises.  CRITICAL CARE Performed by: Cecilio Asper   Total critical care time: 35 minutes  Critical care time was exclusive of separately billable procedures and treating other patients.  Critical care was necessary to treat or prevent imminent or life-threatening deterioration.  Critical care was time spent personally by me on the following activities: development of treatment plan with patient and/or surrogate as well as nursing, discussions with consultants, evaluation of patient's response to treatment, examination of patient, obtaining history from patient or surrogate, ordering and performing treatments and interventions, ordering and review of laboratory studies, ordering and review of radiographic studies, pulse oximetry and re-evaluation of patient's condition.   The patients results and plan were reviewed and discussed.   Any x-rays performed were independently reviewed by myself.   Differential diagnosis were considered with the presenting HPI.  Medications  sodium chloride HYPERTONIC 3 % nebulizer solution 4 mL (4 mLs Nebulization Given 09/25/2020 1956)  azithromycin (ZITHROMAX)  500 mg in sodium chloride 0.9 % 250 mL IVPB (500 mg Intravenous New Bag/Given 10/06/2020 2011)  iohexol  (OMNIPAQUE) 300 MG/ML solution 75 mL (75 mLs Intravenous Contrast Given 10/01/2020 1814)  cefTRIAXone (ROCEPHIN) 2 g in sodium chloride 0.9 % 100 mL IVPB (0 g Intravenous Stopped 09/18/2020 2018)    Vitals:   09/21/2020 1727 10/06/2020 1730 09/18/2020 1800 09/24/2020 2000  BP: (!) 183/96 (!) 157/89 140/81 (!) 153/81  Pulse: (!) 126 (!) 124 (!) 118 (!) 116  Resp: (!) 28 (!) 21 (!) 24 19  Temp:      TempSrc:      SpO2: 94% 95% 97% 99%  Weight:      Height:        Final diagnoses:  Aspiration pneumonia of left lower lobe due to regurgitated food ([place])    Admission/ observation were discussed with the admitting physician, patient and/or family and they are comfortable with the plan.   Final Clinical Impression(s) / ED Diagnoses Final diagnoses:  Aspiration pneumonia of left lower lobe due to regurgitated food [facility])    Rx / DC Orders ED Discharge Orders    None       Deno Etienne, DO 09/21/2020 2027

## 2020-10-03 NOTE — ED Notes (Signed)
One set of blood cultures obtained before antibiotics started.

## 2020-10-03 NOTE — ED Triage Notes (Signed)
The patient comes from [facility] with complaints of feeling as if something was stuck in his throat. When EMS arrived they noted stridor. 11.25 of Epi was given with some success, but stridor was still noted.    EMS vitals: 120 HR 158/76 BP 98 % SPO2 on 2L nasal canula  24 Resp Rate 97.9 Temp

## 2020-10-04 ENCOUNTER — Inpatient Hospital Stay (HOSPITAL_COMMUNITY): Payer: Medicare Other

## 2020-10-04 ENCOUNTER — Other Ambulatory Visit: Payer: Self-pay

## 2020-10-04 DIAGNOSIS — R0989 Other specified symptoms and signs involving the circulatory and respiratory systems: Secondary | ICD-10-CM | POA: Diagnosis not present

## 2020-10-04 DIAGNOSIS — J690 Pneumonitis due to inhalation of food and vomit: Secondary | ICD-10-CM | POA: Diagnosis not present

## 2020-10-04 DIAGNOSIS — Z7189 Other specified counseling: Secondary | ICD-10-CM

## 2020-10-04 DIAGNOSIS — A419 Sepsis, unspecified organism: Principal | ICD-10-CM

## 2020-10-04 DIAGNOSIS — R0600 Dyspnea, unspecified: Secondary | ICD-10-CM | POA: Diagnosis not present

## 2020-10-04 DIAGNOSIS — R061 Stridor: Secondary | ICD-10-CM

## 2020-10-04 DIAGNOSIS — N400 Enlarged prostate without lower urinary tract symptoms: Secondary | ICD-10-CM

## 2020-10-04 DIAGNOSIS — R6520 Severe sepsis without septic shock: Secondary | ICD-10-CM

## 2020-10-04 DIAGNOSIS — E039 Hypothyroidism, unspecified: Secondary | ICD-10-CM

## 2020-10-04 DIAGNOSIS — D72829 Elevated white blood cell count, unspecified: Secondary | ICD-10-CM

## 2020-10-04 LAB — CBC
HCT: 34 % — ABNORMAL LOW (ref 39.0–52.0)
Hemoglobin: 11.3 g/dL — ABNORMAL LOW (ref 13.0–17.0)
MCH: 35.4 pg — ABNORMAL HIGH (ref 26.0–34.0)
MCHC: 33.2 g/dL (ref 30.0–36.0)
MCV: 106.6 fL — ABNORMAL HIGH (ref 80.0–100.0)
Platelets: 268 10*3/uL (ref 150–400)
RBC: 3.19 MIL/uL — ABNORMAL LOW (ref 4.22–5.81)
RDW: 13.1 % (ref 11.5–15.5)
WBC: 22.5 10*3/uL — ABNORMAL HIGH (ref 4.0–10.5)
nRBC: 0 % (ref 0.0–0.2)

## 2020-10-04 LAB — BASIC METABOLIC PANEL
Anion gap: 10 (ref 5–15)
BUN: 28 mg/dL — ABNORMAL HIGH (ref 8–23)
CO2: 25 mmol/L (ref 22–32)
Calcium: 9.4 mg/dL (ref 8.9–10.3)
Chloride: 103 mmol/L (ref 98–111)
Creatinine, Ser: 1.01 mg/dL (ref 0.61–1.24)
GFR, Estimated: >60 mL/min (ref >60)
Glucose, Bld: 158 mg/dL — ABNORMAL HIGH (ref 70–99)
Potassium: 4.5 mmol/L (ref 3.5–5.1)
Sodium: 138 mmol/L (ref 135–145)

## 2020-10-04 LAB — GLUCOSE, CAPILLARY
Glucose-Capillary: 150 mg/dL — ABNORMAL HIGH (ref 70–99)
Glucose-Capillary: 144 mg/dL — ABNORMAL HIGH (ref 70–99)
Glucose-Capillary: 184 mg/dL — ABNORMAL HIGH (ref 70–99)

## 2020-10-04 LAB — ECHOCARDIOGRAM COMPLETE
Area-P 1/2: 5.54 cm2
Height: 65 in
S' Lateral: 3.9 cm
Weight: 2081.14 oz

## 2020-10-04 LAB — PROCALCITONIN: Procalcitonin: 0.93 ng/mL

## 2020-10-04 LAB — FERRITIN: Ferritin: 186 ng/mL (ref 24–336)

## 2020-10-04 LAB — BLOOD GAS, ARTERIAL
Acid-base deficit: 4 mmol/L — ABNORMAL HIGH (ref 0.0–2.0)
Bicarbonate: 25.4 mmol/L (ref 20.0–28.0)
O2 Saturation: 97.7 %
Patient temperature: 98.6
pCO2 arterial: 73 mmHg (ref 32.0–48.0)
pH, Arterial: 7.168 — CL (ref 7.350–7.450)
pO2, Arterial: 120 mmHg — ABNORMAL HIGH (ref 83.0–108.0)

## 2020-10-04 LAB — RETICULOCYTES
Immature Retic Fract: 14.6 % (ref 2.3–15.9)
RBC.: 3.15 MIL/uL — ABNORMAL LOW (ref 4.22–5.81)
Retic Count, Absolute: 35.3 10*3/uL (ref 19.0–186.0)
Retic Ct Pct: 1.1 % (ref 0.4–3.1)

## 2020-10-04 LAB — HEMOGLOBIN A1C
Hgb A1c MFr Bld: 5.4 % (ref 4.8–5.6)
Mean Plasma Glucose: 108.28 mg/dL

## 2020-10-04 LAB — IRON AND TIBC
Iron: 15 ug/dL — ABNORMAL LOW (ref 45–182)
Saturation Ratios: 7 % — ABNORMAL LOW (ref 17.9–39.5)
TIBC: 218 ug/dL — ABNORMAL LOW (ref 250–450)
UIBC: 203 ug/dL

## 2020-10-04 LAB — MRSA PCR SCREENING: MRSA by PCR: NEGATIVE

## 2020-10-04 LAB — FOLATE: Folate: 11.4 ng/mL (ref >5.9)

## 2020-10-04 LAB — BRAIN NATRIURETIC PEPTIDE: B Natriuretic Peptide: 1452.3 pg/mL — ABNORMAL HIGH (ref 0.0–100.0)

## 2020-10-04 LAB — VITAMIN B12: Vitamin B-12: 775 pg/mL (ref 180–914)

## 2020-10-04 MED ORDER — LIDOCAINE-EPINEPHRINE (PF) 1 %-1:200000 IJ SOLN
0.0000 mL | Freq: Once | INTRAMUSCULAR | Status: DC | PRN
  Filled 2020-10-04: qty 30

## 2020-10-04 MED ORDER — GLYCOPYRROLATE 0.2 MG/ML IJ SOLN: 0.2000 mg | INTRAMUSCULAR | Status: DC | PRN

## 2020-10-04 MED ORDER — LORAZEPAM 2 MG/ML PO CONC: 1.0000 mg | ORAL | Status: DC | PRN

## 2020-10-04 MED ORDER — SILVER NITRATE-POT NITRATE 75-25 % EX MISC
1.0000 | Freq: Once | CUTANEOUS | Status: DC | PRN
  Filled 2020-10-04: qty 1

## 2020-10-04 MED ORDER — POLYVINYL ALCOHOL 1.4 % OP SOLN
1.0000 [drp] | Freq: Four times a day (QID) | OPHTHALMIC | Status: DC | PRN
Start: 2020-10-04 — End: 2020-10-05
  Filled 2020-10-04: qty 15

## 2020-10-04 MED ORDER — ONDANSETRON HCL 4 MG/2ML IJ SOLN: 4.0000 mg | Freq: Four times a day (QID) | INTRAMUSCULAR | Status: DC | PRN

## 2020-10-04 MED ORDER — ONDANSETRON 4 MG PO TBDP: 4.0000 mg | ORAL_TABLET | Freq: Four times a day (QID) | ORAL | Status: DC | PRN

## 2020-10-04 MED ORDER — TRIPLE ANTIBIOTIC 3.5-400-5000 EX OINT
1.0000 "application " | TOPICAL_OINTMENT | Freq: Once | CUTANEOUS | Status: DC | PRN
  Filled 2020-10-04: qty 1

## 2020-10-04 MED ORDER — HALOPERIDOL 1 MG PO TABS: 0.5000 mg | ORAL_TABLET | ORAL | Status: DC | PRN

## 2020-10-04 MED ORDER — MORPHINE SULFATE (PF) 2 MG/ML IV SOLN
INTRAVENOUS | Status: AC
  Administered 2020-10-04: 1 mg via INTRAVENOUS
  Filled 2020-10-04: qty 1

## 2020-10-04 MED ORDER — SODIUM CHLORIDE 0.9 % IV SOLN
12.5000 mg | Freq: Four times a day (QID) | INTRAVENOUS | Status: DC | PRN
  Filled 2020-10-04: qty 0.5

## 2020-10-04 MED ORDER — FUROSEMIDE 10 MG/ML IJ SOLN: 60.0000 mg | Freq: Once | INTRAMUSCULAR | Status: DC

## 2020-10-04 MED ORDER — LIDOCAINE HCL 4 % EX SOLN
0.0000 mL | Freq: Once | CUTANEOUS | Status: DC | PRN
  Filled 2020-10-04: qty 50

## 2020-10-04 MED ORDER — LIDOCAINE HCL 2 % EX GEL
1.0000 "application " | Freq: Once | CUTANEOUS | Status: DC | PRN
  Filled 2020-10-04: qty 4250

## 2020-10-04 MED ORDER — GLYCOPYRROLATE 0.2 MG/ML IJ SOLN
0.2000 mg | INTRAMUSCULAR | Status: DC | PRN
  Administered 2020-10-04: 0.2 mg via INTRAVENOUS
  Filled 2020-10-04: qty 1

## 2020-10-04 MED ORDER — OXYMETAZOLINE HCL 0.05 % NA SOLN
1.0000 | Freq: Once | NASAL | Status: DC | PRN
  Filled 2020-10-04: qty 15

## 2020-10-04 MED ORDER — DEXAMETHASONE SODIUM PHOSPHATE 10 MG/ML IJ SOLN
6.0000 mg | Freq: Four times a day (QID) | INTRAMUSCULAR | Status: DC
  Administered 2020-10-04: 6 mg via INTRAVENOUS
  Filled 2020-10-04: qty 1

## 2020-10-04 MED ORDER — LORAZEPAM 1 MG PO TABS: 1.0000 mg | ORAL_TABLET | ORAL | Status: DC | PRN

## 2020-10-04 MED ORDER — IPRATROPIUM-ALBUTEROL 0.5-2.5 (3) MG/3ML IN SOLN: 3.0000 mL | RESPIRATORY_TRACT | Status: DC | PRN

## 2020-10-04 MED ORDER — FUROSEMIDE 10 MG/ML IJ SOLN
60.0000 mg | Freq: Once | INTRAMUSCULAR | Status: AC
  Administered 2020-10-04: 60 mg via INTRAVENOUS
  Filled 2020-10-04: qty 6

## 2020-10-04 MED ORDER — MORPHINE SULFATE (PF) 2 MG/ML IV SOLN
1.0000 mg | INTRAVENOUS | Status: DC | PRN
  Administered 2020-10-05 (×2): 1 mg via INTRAVENOUS
  Filled 2020-10-04 (×2): qty 1

## 2020-10-04 MED ORDER — HALOPERIDOL LACTATE 5 MG/ML IJ SOLN: 0.5000 mg | INTRAMUSCULAR | Status: DC | PRN

## 2020-10-04 MED ORDER — GLYCOPYRROLATE 1 MG PO TABS: 1.0000 mg | ORAL_TABLET | ORAL | Status: DC | PRN

## 2020-10-04 MED ORDER — LEVOTHYROXINE SODIUM 100 MCG/5ML IV SOLN
50.0000 ug | Freq: Every day | INTRAVENOUS | Status: DC
  Administered 2020-10-04: 50 ug via INTRAVENOUS
  Filled 2020-10-04: qty 5

## 2020-10-04 MED ORDER — INSULIN ASPART 100 UNIT/ML ~~LOC~~ SOLN: 0.0000 [IU] | SUBCUTANEOUS | Status: DC

## 2020-10-04 MED ORDER — PANTOPRAZOLE SODIUM 40 MG IV SOLR
40.0000 mg | Freq: Two times a day (BID) | INTRAVENOUS | Status: DC
  Administered 2020-10-04: 40 mg via INTRAVENOUS
  Filled 2020-10-04: qty 40

## 2020-10-04 MED ORDER — HALOPERIDOL LACTATE 2 MG/ML PO CONC
0.5000 mg | ORAL | Status: DC | PRN
  Filled 2020-10-04: qty 0.3

## 2020-10-04 MED ORDER — LORAZEPAM 2 MG/ML IJ SOLN: 1.0000 mg | INTRAMUSCULAR | Status: DC | PRN

## 2020-10-04 NOTE — Progress Notes (Signed)
OT Cancellation Note  Patient Details Name: Nesbit Michon MRN: [id number] DOB: 1918-05-02   Cancelled Treatment:    Reason Eval/Treat Not Completed: Patient not medically ready. RN requests therapist hold - patient continues to have stridor and respiratory distress.  Liah Morr L Crimson Dubberly 10/04/2020, 12:21 PM

## 2020-10-04 NOTE — TOC Initial Note (Signed)
Transition of Care [facility]) - Initial/Assessment Note    Patient Details  Name: Cory Burton MRN: [id number] Date of Birth: 1918-01-13  [facility] CM/SW Contact:    Leeroy Cha, RN Phone Number: 10/04/2020, 8:42 AM  Clinical Narrative:                 87 year old white male that presents to the emergency room from home.  His daughter states that over the past 3 weeks he has had increasing hoarseness in his voice.  Today he reportedly awoke feeling fine and went to his bridge group.  During that time he developed stridorous air movement but denies feeling short of breath.  He was seen by the community nurse and sent to the emergency room.  Patient has had postprandial throat clearing and a persistent throat cough.  No diagnosis of GERD.   Though this patient is 87 years old he lives independently.[name]  He cooks 2 out of 3 meals a day.  Utilizes a walker for ambulation.  Plays bridge 2-3 times a week. plan is to return to home does have daughter in the area  Expected Discharge Plan: Home/Self Care Barriers to Discharge: Continued Medical Work up   Patient Goals and CMS Choice Patient states their goals for this hospitalization and ongoing recovery are:: to go back to my house      Expected Discharge Plan and Services Expected Discharge Plan: Home/Self Care   Discharge Planning Services: CM Consult   Living arrangements for the past 2 months: Apartment                                      Prior Living Arrangements/Services Living arrangements for the past 2 months: Apartment Lives with:: Self Patient language and need for interpreter reviewed:: Yes Do you feel safe going back to the place where you live?: Yes      Need for Family Participation in Patient Care: Yes (Comment) Care giver support system in place?: Yes (comment)   Criminal Activity/Legal Involvement Pertinent to Current Situation/Hospitalization: No - Comment as needed  Activities of  Daily Living Home Assistive Devices/Equipment: Environmental consultant (specify type),Eyeglasses,Hearing aid (2 hearing aides, magnifier) ADL Screening (condition at time of admission) Patient's cognitive ability adequate to safely complete daily activities?: Yes Is the patient deaf or have difficulty hearing?: Yes (wears 2 hearing aides) Does the patient have difficulty seeing, even when wearing glasses/contacts?: Yes (legally blind) Does the patient have difficulty concentrating, remembering, or making decisions?: No Patient able to express need for assistance with ADLs?: Yes Does the patient have difficulty dressing or bathing?: No Independently performs ADLs?: No (uses a walker) Communication: Independent Dressing (OT): Independent Grooming: Independent Feeding: Independent Bathing: Independent Toileting: Needs assistance Is this a change from baseline?: Pre-admission baseline In/Out Bed: Needs assistance Is this a change from baseline?: Pre-admission baseline Walks in Home: Needs assistance Is this a change from baseline?: Pre-admission baseline Does the patient have difficulty walking or climbing stairs?: Yes Weakness of Legs: Both Weakness of Arms/Hands: Both  Permission Sought/Granted                  Emotional Assessment Appearance:: Appears stated age Attitude/Demeanor/Rapport: Engaged Affect (typically observed): Calm Orientation: : Oriented to Self,Oriented to Place,Oriented to  Time,Oriented to Situation Alcohol / Substance Use: Not Applicable Psych Involvement: No (comment)  Admission diagnosis:  Pneumonia [J18.9] Aspiration pneumonia of left lower lobe  due to regurgitated food [facility]) [J69.0] Patient Active Problem List   Diagnosis Date Noted  . Pneumonia 09/22/2020  . Abnormal chest sounds 09/20/2020  . Sepsis ([place]) 09/23/2020  . Iron deficiency anemia 08/15/2020  . Sebaceous cyst 05/30/2018  . Memory deficit 04/07/2018  . Numbness of left hand 04/07/2018  . PVD  (peripheral vascular disease) ([place]) 12/07/2017  . History of skin cancer 03/09/2017  . Shoulder pain, left 12/02/2016  . Abnormality of gait 10/24/2015  . Grief 06/11/2015  . Anemia 03/06/2015  . Hypokalemia   . Osteoporosis 10/25/2014  . Sciatica of right side 04/26/2014  . HTN (hypertension) 04/28/2013  . Edema 04/28/2013  . BPH (benign prostatic hyperplasia) 04/28/2013  . Hypothyroidism 04/28/2013  . Fracture, intertrochanteric, left femur ([place]) 12/17/2011   PCP:  Mast, Man X, NP Pharmacy:   [id number] [address], [place] [address] Phone: [phone] Fax: [phone]     Social Determinants of Health (SDOH) Interventions    Readmission Risk Interventions No flowsheet data found.

## 2020-10-04 NOTE — Progress Notes (Signed)
PT Cancellation Note  Patient Details Name: Cory Burton MRN: [id number] DOB: Dec 31, 1917   Cancelled Treatment:     PT order received but eval deferred at RN request - per ENT report, pt with bil vocal cord paralysis.  Will follow.   BRADSHAW,HUNTER 10/04/2020, 11:49 AM

## 2020-10-04 NOTE — Progress Notes (Signed)
PROGRESS NOTE  Cory Burton ZJQ:[id number] DOB: Sep 25, 1917   PCP: Mast, Man X, NP  Patient is from: Home  DOA: 10/09/2020 LOS: 1  Chief complaints: Respiratory distress  Brief Narrative / Interim history: 87 year old M with PMH of HTN, BPH, hypothyroidism, cardiomegaly, IDA, osteoporosis, lumbar spinal stenosis and thoracolumbar DDD brought to ED by EMS with respiratory distress.  EMS noted stridor on arrival.  He was given racemic epinephrine and started on supplemental oxygen.  He was admitted for upper airway issue of unknown cause and sepsis due to LLL pneumonia.  PCCM consulted.  Subjective: Seen and examined earlier this morning.  Patient seems to have progressive respiratory distress with increased oxygen requirement and increased work of breathing.  However, he denies shortness of breath or chest pain but feels a lump in his throat and productive cough.  He denies chest pain.  Supplemental oxygen escalated to NRB.Cory Burton  ABG with acute respiratory acidosis.  PCCM and patient's daughter notified.  Patient's daughter confirms DNR but okay with intubation. However, patient doesn't want intubation when PCCM asked. Daughter notified about patient's preference and she is heading up here to talk to him. ENT and palliative medicine consulted as well.   Objective: Vitals:   10/04/20 0400 10/04/20 0500 10/04/20 0700 10/04/20 0800  BP: (!) 147/73 123/64 (!) 160/70 (!) 152/72  Pulse: (!) 106 97 (!) 110 (!) 109  Resp: (!) 29 (!) 22 (!) 23   Temp: (!) 97 F (36.1 C)   97.9 F (36.6 C)  TempSrc: Axillary   Axillary  SpO2: 91% 94% 94% 92%  Weight:      Height:        Intake/Output Summary (Last 24 hours) at 10/04/2020 1003 Last data filed at 10/04/2020 0700 Gross per 24 hour  Intake 987.5 ml  Output 200 ml  Net 787.5 ml   Filed Weights   10/02/2020 1710 10/01/2020 2200  Weight: 59 kg 59 kg    Examination:  GENERAL: No apparent distress.  Nontoxic. HEENT: MMM. No notable oropharyngeal  lesion.  Vision and hearing grossly intact.  NECK: Supple.  Prominent JVD.  No swelling or lymphadenopathy. RESP: 98% on NRB.  IWOB.  Upper airway sound.  Diminished aeration over lower lung fields. CVS:  RRR. Heart sounds normal.  ABD/GI/GU: BS+. Abd soft, NTND.  MSK/EXT:  Moves extremities. No apparent deformity. No edema.  SKIN: no apparent skin lesion or wound NEURO: Awake, alert and oriented appropriately.  No apparent focal neuro deficit. PSYCH: Calm. Normal affect.  Procedures:  None  Microbiology summarized: Influenza and COVID-19 PCR nonreactive. MRSA PCR nonreactive. Blood cultures pending.  Assessment & Plan: Acute hypoxemic hypercapnic respiratory failure due to upper airway way problem and left lung aspiration pneumonia: concern for aspiration given presbyesophagus noted on CT neck. Repeat CXR personally reviewed shows marked progressive airspace opacity in left lung raising concern for progressive pneumonia with underlying CHF.  BNP elevated to 1452.  He has prominent JVD as well.  Patient refused intubation but daughter says intubation is okay per their discussion last night. Daughter heading to the hospital to talk to patient. PCCM okay to continue monitoring with intubation for now.  -Schedule Decadron -IV Lasix 60 mg x 1 -DuoNeb every 4 hours as needed -Continue ceftriaxone and azithromycin. -Increase PPI to 40 mg twice daily -ENT and palliative medicine consulted. -Echocardiogram -Check procalcitonin -N.p.o.  Severe sepsis due to left lung aspiration pneumonia: POA: Repeat CXR with progressive airspace opacity left lung.  -Management as above  Goals of care discussion:  On admission, patient was partial code with no CPR or DCCV but intubation.  Daughter, Cory Burton confirmed this but patient refusing intubation when PCCM talked to him this morning.  Patient's daughter notified patient's wish and will be coming to talk to him. -Palliative medicine  consulted  Generalized weakness, recent fall: Likely due to underlying infection/pneumonia.  No focal motor or sensory deficit on exam. -PT/OT, fall precautions  Thoracolumbar DDD/lumbar spinal stenosis: Stable -As needed pain meds  BPH -Continue finasteride and tamsulosin when able to take p.o.  Hypothyroidism -Continue Synthroid IV while n.p.o.  Leukocytosis: Likely due to #1 and steroid -Continue monitoring no problem  Normocytic anemia -Continue monitoring   Body mass index is 21.64 kg/m.         DVT prophylaxis:  enoxaparin (LOVENOX) injection 40 mg Start: 10/10/2020 2200  Code Status: Partial code-no CPR, ACLS meds, DCCV but intubation and NIPPV.  Family Communication: Updated patient's daughter over the phone. Level of care: Stepdown Status is: Inpatient  Remains inpatient appropriate because:Ongoing diagnostic testing needed not appropriate for outpatient work up, Unsafe d/c plan, IV treatments appropriate due to intensity of illness or inability to take PO and Inpatient level of care appropriate due to severity of illness   Dispo: The patient is from: Home              Anticipated d/c is to: To be determined              Anticipated d/c date is: > 3 days              Patient currently is not medically stable to d/c.   Difficult to place patient No       Consultants:  PCCM   Sch Meds:  Scheduled Meds:  Chlorhexidine Gluconate Cloth  6 each Topical Daily   dexamethasone (DECADRON) injection  6 mg Intravenous Q6H   enoxaparin (LOVENOX) injection  40 mg Subcutaneous Q24H   finasteride  5 mg Oral Daily   insulin aspart  0-6 Units Subcutaneous Q4H   levothyroxine  50 mcg Intravenous Daily   pantoprazole (PROTONIX) IV  40 mg Intravenous Q12H   sodium chloride HYPERTONIC  4 mL Nebulization Daily   tamsulosin  0.4 mg Oral Daily   Continuous Infusions:  azithromycin (ZITHROMAX) 500 MG IVPB (Vial-Mate [employer])     cefTRIAXone (ROCEPHIN)  IV      PRN Meds:.acetaminophen **OR** acetaminophen  Antimicrobials: Anti-infectives (From admission, onward)   Start     Dose/Rate Route Frequency Ordered Stop   10/04/20 2000  cefTRIAXone (ROCEPHIN) 2 g in sodium chloride 0.9 % 100 mL IVPB        2 g 200 mL/hr over 30 Minutes Intravenous Every 24 hours 10/02/2020 2041     10/04/20 2000  azithromycin (ZITHROMAX) 500 mg in sodium chloride 0.9 % 250 mL IVPB        500 mg 250 mL/hr over 60 Minutes Intravenous Every 24 hours 10/11/2020 2041     10/04/2020 1900  cefTRIAXone (ROCEPHIN) 2 g in sodium chloride 0.9 % 100 mL IVPB        2 g 200 mL/hr over 30 Minutes Intravenous  Once 10/02/2020 1854 10/09/2020 2018   10/07/2020 1900  azithromycin (ZITHROMAX) 500 mg in sodium chloride 0.9 % 250 mL IVPB        500 mg 250 mL/hr over 60 Minutes Intravenous  Once 09/28/2020 1854 10/14/2020 2111       I have personally  reviewed the following labs and images: CBC: Recent Labs  Lab 09/29/2020 1705 10/04/2020 1752 10/04/20 0246  WBC 19.1*  --  22.5*  NEUTROABS 16.7*  --   --   HGB 10.9* 11.2* 11.3*  HCT 33.7* 33.0* 34.0*  MCV 107.3*  --  106.6*  PLT 337  --  268   BMP &GFR Recent Labs  Lab 10/02/2020 1705 09/21/2020 1752 10/04/20 0738  NA 137 136 138  K 4.0 3.9 4.5  CL 101 101 103  CO2 26  --  25  GLUCOSE 198* 197* 158*  BUN 25* 26* 28*  CREATININE 0.91 1.00 1.01  CALCIUM 9.7  --  9.4   Estimated Creatinine Clearance: 32.5 mL/min (by C-G formula based on SCr of 1.01 mg/dL). Liver & Pancreas: No results for input(s): AST, ALT, ALKPHOS, BILITOT, PROT, ALBUMIN in the last 168 hours. No results for input(s): LIPASE, AMYLASE in the last 168 hours. No results for input(s): AMMONIA in the last 168 hours. Diabetic: Recent Labs    10/04/20 0738  HGBA1C 5.4   Recent Labs  Lab 10/04/20 0904  GLUCAP 150*   Cardiac Enzymes: No results for input(s): CKTOTAL, CKMB, CKMBINDEX, TROPONINI in the last 168 hours. No results for input(s): PROBNP in the last 8760  hours. Coagulation Profile: No results for input(s): INR, PROTIME in the last 168 hours. Thyroid Function Tests: No results for input(s): TSH, T4TOTAL, FREET4, T3FREE, THYROIDAB in the last 72 hours. Lipid Profile: No results for input(s): CHOL, HDL, LDLCALC, TRIG, CHOLHDL, LDLDIRECT in the last 72 hours. Anemia Panel: Recent Labs    10/04/20 0738  VITAMINB12 775  FOLATE 11.4  FERRITIN 186  TIBC 218*  IRON 15*  RETICCTPCT 1.1   Urine analysis:    Component Value Date/Time   COLORURINE YELLOW 12/17/2011 0154   APPEARANCEUR Clear 02/20/2015 1247   LABSPEC 1.018 12/17/2011 0154   PHURINE 7.0 12/17/2011 0154   GLUCOSEU Negative 02/20/2015 1247   HGBUR NEGATIVE 12/17/2011 0154   BILIRUBINUR Negative 02/20/2015 1247   KETONESUR TRACE (A) 12/17/2011 0154   PROTEINUR Trace 02/20/2015 1247   PROTEINUR NEGATIVE 12/17/2011 0154   UROBILINOGEN 0.2 12/17/2011 0154   NITRITE Negative 02/20/2015 1247   NITRITE NEGATIVE 12/17/2011 0154   LEUKOCYTESUR Negative 02/20/2015 1247   Sepsis Labs: Invalid input(s): PROCALCITONIN, [place]  Microbiology: Recent Results (from the past 240 hour(s))  Resp Panel by RT-PCR (Flu A&B, Covid) Nasopharyngeal Swab     Status: None   Collection Time: 10/13/2020  7:30 PM   Specimen: Nasopharyngeal Swab; Nasopharyngeal(NP) swabs in vial transport medium  Result Value Ref Range Status   SARS Coronavirus 2 by RT PCR NEGATIVE NEGATIVE Final    Comment: (NOTE) SARS-CoV-2 target nucleic acids are NOT DETECTED.  The SARS-CoV-2 RNA is generally detectable in upper respiratory specimens during the acute phase of infection. The lowest concentration of SARS-CoV-2 viral copies this assay can detect is 138 copies/mL. A negative result does not preclude SARS-Cov-2 infection and should not be used as the sole basis for treatment or other patient management decisions. A negative result may occur with  improper specimen collection/handling, submission of specimen  other than nasopharyngeal swab, presence of viral mutation(s) within the areas targeted by this assay, and inadequate number of viral copies(<138 copies/mL). A negative result must be combined with clinical observations, patient history, and epidemiological information. The expected result is Negative.  Fact Sheet for Patients:  EntrepreneurPulse.com.au  Fact Sheet for Healthcare Providers:  IncredibleEmployment.be  This test is no  t yet approved or cleared by the [place] and  has been authorized for detection and/or diagnosis of SARS-CoV-2 by FDA under an Emergency Use Authorization (EUA). This EUA will remain  in effect (meaning this test can be used) for the duration of the COVID-19 declaration under Section 564(b)(1) of the Act, 21 U.S.C.section 360bbb-3(b)(1), unless the authorization is terminated  or revoked sooner.       Influenza A by PCR NEGATIVE NEGATIVE Final   Influenza B by PCR NEGATIVE NEGATIVE Final    Comment: (NOTE) The Xpert Xpress SARS-CoV-2/FLU/RSV plus assay is intended as an aid in the diagnosis of influenza from Nasopharyngeal swab specimens and should not be used as a sole basis for treatment. Nasal washings and aspirates are unacceptable for Xpert Xpress SARS-CoV-2/FLU/RSV testing.  Fact Sheet for Patients: EntrepreneurPulse.com.au  Fact Sheet for Healthcare Providers: IncredibleEmployment.be  This test is not yet approved or cleared by the [place] FDA and has been authorized for detection and/or diagnosis of SARS-CoV-2 by FDA under an Emergency Use Authorization (EUA). This EUA will remain in effect (meaning this test can be used) for the duration of the COVID-19 declaration under Section 564(b)(1) of the Act, 21 U.S.C. section 360bbb-3(b)(1), unless the authorization is terminated or revoked.  Performed at [facility], [place] [address] [place], [place] [address]   MRSA PCR Screening     Status: None   Collection Time: 10/04/20 12:18 AM   Specimen: Nasopharyngeal  Result Value Ref Range Status   MRSA by PCR NEGATIVE NEGATIVE Final    Comment:        The GeneXpert MRSA Assay (FDA approved for NASAL specimens only), is one component of a comprehensive MRSA colonization surveillance program. It is not intended to diagnose MRSA infection nor to guide or monitor treatment for MRSA infections. Performed at [facility], [place] [address], [place], [place] [address]     Radiology Studies: CT HEAD WO CONTRAST  Result Date: 10/02/2020 CLINICAL DATA:  Difficulty swallowing EXAM: CT HEAD WITHOUT CONTRAST TECHNIQUE: Contiguous axial images were obtained from the base of the skull through the vertex without intravenous contrast. COMPARISON:  10/04/2020, 06/15/2016 FINDINGS: Brain: Confluent hypodensities throughout the periventricular and subcortical white matter consistent with chronic small vessel ischemic changes, stable. Calcifications within the bilateral basal ganglia and cerebellum unchanged. No acute infarct or hemorrhage. Lateral ventricles and midline structures are unremarkable. No acute extra-axial fluid collections. No mass effect. Vascular: Diffuse atherosclerosis throughout the internal carotid arteries. No hyperdense vessel. Skull: Normal. Negative for fracture or focal lesion. Sinuses/Orbits: No acute finding. Other: None. IMPRESSION: 1. Chronic small vessel ischemic changes. No acute intracranial process. Electronically Signed   By: Randa Ngo M.D.   On: 09/27/2020 23:50   CT Soft Tissue Neck W Contrast  Result Date: 10/01/2020 CLINICAL DATA:  Sensation of something stuck in the throat. Question epiglottitis or tonsillitis. EXAM: CT NECK WITH CONTRAST TECHNIQUE: Multidetector CT imaging of the neck was performed using the standard protocol following the bolus administration of intravenous  contrast. CONTRAST:  12mL OMNIPAQUE IOHEXOL 300 MG/ML  SOLN COMPARISON:  None. FINDINGS: Pharynx and larynx: No evidence of mucosal or submucosal mass or inflammatory disease. No sign of any foreign object. The upper thoracic esophagus is somewhat dilated and fluid-filled. Commonly, patients with presbyesophagus can have the sensation something stuck in the throat. Salivary glands: Parotid and submandibular glands are normal. Thyroid: Normal Lymph nodes: No adenopathy. Vascular: Ordinary atherosclerotic calcification at the carotid  bifurcations. Limited intracranial: No significant finding. Visualized orbits: Normal Mastoids and visualized paranasal sinuses: Clear Skeleton: Ordinary cervical and thoracic spondylosis. No advanced or acute finding. Upper chest: Minimal scarring.  No active process. Other: None IMPRESSION: No sign of foreign object. No evidence of mass or inflammatory disease. The upper thoracic esophagus is somewhat dilated and fluid-filled. Commonly, patients with presbyesophagus can have the sensation something stuck in the throat. Electronically Signed   By: Nelson Chimes M.D.   On: 10/12/2020 18:43   DG Chest Port 1 View  Result Date: 09/24/2020 CLINICAL DATA:  Feels like something is stuck in throat, stridor EXAM: PORTABLE CHEST 1 VIEW COMPARISON:  Portable exam 1830 hours compared to 12/17/2011 FINDINGS: Enlargement of cardiac silhouette. Mediastinal contours and pulmonary vascularity normal. Atherosclerotic calcification aorta. LEFT lower lobe infiltrate consistent with pneumonia. Underlying chronic lung changes again identified. No pleural effusion or pneumothorax. Bones demineralized. IMPRESSION: Enlargement of cardiac silhouette. Chronic lung disease with LEFT lower lobe pneumonia. Electronically Signed   By: Lavonia Dana M.D.   On: 10/04/2020 18:36      Librada Castronovo T. [place]  If 7PM-7AM, please contact night-coverage [email or web address] 10/04/2020, 10:03 AM

## 2020-10-04 NOTE — Progress Notes (Signed)
RN placing PT on 50% VM at this time.

## 2020-10-04 NOTE — Progress Notes (Signed)
RN notified pCO2 73, pH 7.168. Gonfa MD and CCM notified to bedside.

## 2020-10-04 NOTE — Progress Notes (Signed)
Notified Lab ABG being sent for analysis. 

## 2020-10-04 NOTE — Progress Notes (Signed)
[facility] Progress Note Patient Name: Cory Burton DOB: 05-Feb-1918 MRN: [id number]   Date of Service  10/04/2020  HPI/Events of Note  Patient is on oral synthroid 100 mcg daily, but is currently NPO.  eICU Interventions  Synthroid 50 mcg iv daily ordered while patient is NPO.        Frederik Pear 10/04/2020, 6:53 AM

## 2020-10-04 NOTE — Progress Notes (Signed)
Patinet made comfort care at this time 1820. RN administered 1 mg Morphine for work of breathing and placed patient on 2 L [place]

## 2020-10-04 NOTE — Progress Notes (Signed)
Patient placed on Venti mask 50%- O2 still remaining 85%- RN switched patient to 15 LNRB. O2 95% at this time.

## 2020-10-04 NOTE — Progress Notes (Signed)
Patient somnolent and agonal on NRB. Family decided for full comfort measures. Anticipate in hospital death

## 2020-10-04 NOTE — Progress Notes (Signed)
SLP Cancellation Note  Patient Details Name: Cory Burton MRN: [id number] DOB: [birth year]   Cancelled treatment: Pt is in respiratory distress. Spoke with RN and NP, [name]. Will hold on swallow assessment for now and follow along for readiness when appropriate.  [name] L. Tivis Ringer, [place] CCC/SLP Acute Rehabilitation Services Office number [phone] Pager [phone]           [name] 10/04/2020, 11:41 AM

## 2020-10-04 NOTE — Progress Notes (Signed)
  Echocardiogram 2D Echocardiogram has been performed.  Cory Burton 10/04/2020, 2:15 PM

## 2020-10-04 NOTE — Progress Notes (Addendum)
NAME:  Cory Burton, MRN:  [id number], DOB:  07-19-1918, LOS: 1 ADMISSION DATE:  09/30/2020, CONSULTATION DATE: 09/19/2020 REFERRING MD: Dr. Marlowe Sax, CHIEF COMPLAINT: Stridor  Brief History:  87 year old male that presented with stridor today.  History of Present Illness:  This is a 87 year old white male that presents to the emergency room from home.  His daughter states that over the past 3 weeks he has had increasing hoarseness in his voice.  Today he reportedly awoke feeling fine and went to his bridge group.  During that time he developed stridorous air movement but denies feeling short of breath.  He was seen by the community nurse and sent to the emergency room.  Patient has had postprandial throat clearing and a persistent throat cough.  No diagnosis of GERD.   Though this patient is 87 years old he lives independently.[name]  He cooks 2 out of 3 meals a day.  Utilizes a walker for ambulation.  Plays bridge 2-3 times a week.  Past Medical History:  Hypertension BPH Hypothyroidism Bilateral lower extremity edema Anemia Cardiomegaly Osteoporosis Unspecified hearing loss   Consults:  Critical care  Procedures:  None  Significant Diagnostic Tests:  CT head neck: No sign of foreign object. No evidence of mass or inflammatory disease. The upper thoracic esophagus is somewhat dilated and fluid-filled. Commonly, patients with presbyesophagus can have the sensation something stuck in the throat  Micro Data:  Influenza negative Covid: Negative  Antimicrobials:  Azithromycin Rocephin  Objective   Blood pressure (!) 152/72, pulse (!) 109, temperature 97.9 F (36.6 C), temperature source Axillary, resp. rate (!) 23, height 5\' 5"  (1.651 m), weight 59 kg, SpO2 92 %.        Intake/Output Summary (Last 24 hours) at 10/04/2020 0838 Last data filed at 10/04/2020 0700 Gross per 24 hour  Intake 987.5 ml  Output 200 ml  Net 787.5 ml   Filed Weights   10/04/2020 1710 09/20/2020 2200   Weight: 59 kg 59 kg   ABG    Component Value Date/Time   PHART 7.168 (LL) 10/04/2020 0920   PCO2ART 73.0 (HH) 10/04/2020 0920   PO2ART 120 (H) 10/04/2020 0920   HCO3 25.4 10/04/2020 0920   TCO2 27 10/10/2020 1752   ACIDBASEDEF 4.0 (H) 10/04/2020 0920   O2SAT 97.7 10/04/2020 0920    Examination: General: Frail elderly male who is hard of hearing but otherwise and neurologically intact HEENT: Stridor can be heard without stethoscope centers in the neck Neuro: Grossly intact without focal defect other than hard of hearing CV:'s are distant PULM coarse rhonchi left. 60 % NRB GI: soft, bsx4 active  GU: Condom cath with amber urine Extremities: warm/dry, negative edema multiple areas of ecchymosis noted right shoulder normal Skin: no rashes or lesions    Assessment & Plan:  Stridor without airway compromise Possible chronic aspiration leading to hoarseness and mucus production causing stridor. Presbyesophagus Hypertension Left lower lobe atelectasis with possible infiltrate Hypercarbic resp failure Plan: CT scan demonstrated open airway. Noted to have stridor on awakening 10/04/2020 suspect this is aspiration leading to vocal cord dysfunction stridor ENT consult will need trach if pursued. Full DNR per family. Steroids N.p.o. for now Continue proton pump inhibitor Currently on empirical antimicrobial therapy 1 of Zithromax and Rocephin   Note; back to bedside 0 930 due to increasing FiO2 needs.  Noted to have worsening stridor.  Arterial blood gases showed pH 7.13 and PCO2 of 72 and chest x-ray shows increasing left lower lobe infiltrate.  Question of not whether to intubate we will wait till the daughter arrives for further discussion concerning course of action. 10/04/20 1130 am daughter HCPOA and pt have opted for DNR status, continue current tx but if declines full comfort cart will be instituted.   Steroid-induced hyperglycemia CBG (last 3)  No results for input(s):  GLUCAP in the last 72 hours.  Start sliding scale insulin protocol 10/04/1998 2010 All other issues per primary      Best practice (evaluated daily)  Diet: N.p.o.  pain/Anxiety/Delirium protocol (if indicated): None VAP protocol (if indicated): Not indicated DVT prophylaxis: Lovenox GI prophylaxis: Protonix Glucose control: Insulin sliding scale Mobility: Up with assistance Disposition: Admit to PCU  Goals of Care:   Code Status: Partial DNR Daughter at bedside 10/04/18 ongoing discussions on goals of care. Comfort vs aggressive interventions. . Labs   CBC: Recent Labs  Lab 09/29/2020 1705 09/27/2020 1752 10/04/20 0246  WBC 19.1*  --  22.5*  NEUTROABS 16.7*  --   --   HGB 10.9* 11.2* 11.3*  HCT 33.7* 33.0* 34.0*  MCV 107.3*  --  106.6*  PLT 337  --  407    Basic Metabolic Panel: Recent Labs  Lab 09/21/2020 1705 10/02/2020 1752  NA 137 136  K 4.0 3.9  CL 101 101  CO2 26  --   GLUCOSE 198* 197*  BUN 25* 26*  CREATININE 0.91 1.00  CALCIUM 9.7  --    GFR: Estimated Creatinine Clearance: 32.8 mL/min (by C-G formula based on SCr of 1 mg/dL). Recent Labs  Lab 09/29/2020 1705 10/12/2020 2137 10/04/20 0246  WBC 19.1*  --  22.5*  LATICACIDVEN  --  1.8  --     Liver Function Tests: No results for input(s): AST, ALT, ALKPHOS, BILITOT, PROT, ALBUMIN in the last 168 hours. No results for input(s): LIPASE, AMYLASE in the last 168 hours. No results for input(s): AMMONIA in the last 168 hours.  ABG    Component Value Date/Time   TCO2 27 10/02/2020 1752     Coagulation Profile: No results for input(s): INR, PROTIME in the last 168 hours.  Cardiac Enzymes: No results for input(s): CKTOTAL, CKMB, CKMBINDEX, TROPONINI in the last 168 hours.  HbA1C: No results found for: HGBA1C  CBG: No results for input(s): GLUCAP in the last 168 hours.   Critical care time: 25 minutes      [name] ACNP Acute Care Nurse Practitioner [place] Please consult Amion 10/04/2020, 8:39 AM

## 2020-10-04 NOTE — Consult Note (Signed)
Reason for Consult: St[place] Referring Physician: Mercy Riding, MD  Cory Burton is an 87 y.o. male.  HPI: Previously healthy elderly gentleman, lives on his own at friend's home.  His daughter is in the room with him today.  He has been having intermittent hoarseness for a few months.  He has never had a known problem with heartburn or reflux.  He started having difficulty breathing and some noisy breathing and was admitted to the hospital with aspiration pneumonia.  He was found to have stridor.  He has not been able to talk very much.  He is also very hard of hearing.  He drinks beer and wine on a daily basis and about 4 to 5 cups of coffee every day.  He also enjoys chocolate.  Past Medical History:  Diagnosis Date  . Acute posthemorrhagic anemia 12/23/2011  . Anemia 03/06/2015  . BPH (benign prostatic hyperplasia)   . Cardiomegaly 12/24/2011  . Degeneration of thoracic or thoracolumbar intervertebral disc 12/24/2011  . Edema 04/28/2012  . Fracture, intertrochanteric, left femur ([place]) 12/17/2011  . Hypertension   . Other abnormal blood chemistry 12/24/2011  . Senile osteoporosis 08/25/2012  . Spinal stenosis, lumbar region, without neurogenic claudication 12/24/2011  . Thyroid disease   . Unspecified hearing loss 12/24/2011  . Unspecified hypothyroidism 12/23/2011    Past Surgical History:  Procedure Laterality Date  . COMPRESSION HIP SCREW  12/17/2011   Procedure: COMPRESSION HIP;  Surgeon: Johnny Bridge, MD;  Location: [facility];  Service: Orthopedics;  Laterality: Left;  . FOOT SURGERY      Family History  Problem Relation Age of Onset  . Cancer Mother   . Other Father        accident    Social History:  reports that he quit smoking about 35 years ago. His smoking use included pipe. He has never used smokeless tobacco. He reports current alcohol use of about 7.0 standard drinks of alcohol per week. He reports that he does not use drugs.  Allergies:  Allergies  Allergen Reactions  .  Clindamycin/Lincomycin     Developed C. dificile  . Horse-Derived Products     Horse serum  . Mold Extract [Trichophyton]     Medications: Reviewed  Results for orders placed or performed during the hospital encounter of 10/12/2020 (from the past 48 hour(s))  CBC with Differential     Status: Abnormal   Collection Time: 09/20/2020  5:05 PM  Result Value Ref Range   WBC 19.1 (H) 4.0 - 10.5 K/uL   RBC 3.14 (L) 4.22 - 5.81 MIL/uL   Hemoglobin 10.9 (L) 13.0 - 17.0 g/dL   HCT 33.7 (L) 39.0 - 52.0 %   MCV 107.3 (H) 80.0 - 100.0 fL   MCH 34.7 (H) 26.0 - 34.0 pg   MCHC 32.3 30.0 - 36.0 g/dL   RDW 13.2 11.5 - 15.5 %   Platelets 337 150 - 400 K/uL   nRBC 0.0 0.0 - 0.2 %   Neutrophils Relative % 87 %   Neutro Abs 16.7 (H) 1.7 - 7.7 K/uL   Lymphocytes Relative 4 %   Lymphs Abs 0.8 0.7 - 4.0 K/uL   Monocytes Relative 8 %   Monocytes Absolute 1.5 (H) 0.1 - 1.0 K/uL   Eosinophils Relative 0 %   Eosinophils Absolute 0.0 0.0 - 0.5 K/uL   Basophils Relative 0 %   Basophils Absolute 0.0 0.0 - 0.1 K/uL   Immature Granulocytes 1 %   Abs Immature Granulocytes  0.10 (H) 0.00 - 0.07 K/uL    Comment: Performed at [facility], [place] [address] [place], [place] [address]  Basic metabolic panel     Status: Abnormal   Collection Time: 09/21/2020  5:05 PM  Result Value Ref Range   Sodium 137 135 - 145 mmol/L   Potassium 4.0 3.5 - 5.1 mmol/L   Chloride 101 98 - 111 mmol/L   CO2 26 22 - 32 mmol/L   Glucose, Bld 198 (H) 70 - 99 mg/dL    Comment: Glucose reference range applies only to samples taken after fasting for at least 8 hours.   BUN 25 (H) 8 - 23 mg/dL   Creatinine, Ser 0.91 0.61 - 1.24 mg/dL   Calcium 9.7 8.9 - 10.3 mg/dL   GFR, Estimated >60 >60 mL/min    Comment: (NOTE) Calculated using the CKD-EPI Creatinine Equation (2021)    Anion gap 10 5 - 15    Comment: Performed at [facility], [place] [address], [place], [place] [address]  I-stat chem 8, ED (not at  [facility] or [facility])     Status: Abnormal   Collection Time: 09/17/2020  5:52 PM  Result Value Ref Range   Sodium 136 135 - 145 mmol/L   Potassium 3.9 3.5 - 5.1 mmol/L   Chloride 101 98 - 111 mmol/L   BUN 26 (H) 8 - 23 mg/dL   Creatinine, Ser 1.00 0.61 - 1.24 mg/dL   Glucose, Bld 197 (H) 70 - 99 mg/dL    Comment: Glucose reference range applies only to samples taken after fasting for at least 8 hours.   Calcium, Ion 1.31 1.15 - 1.40 mmol/L   TCO2 27 22 - 32 mmol/L   Hemoglobin 11.2 (L) 13.0 - 17.0 g/dL   HCT 33.0 (L) 39.0 - 52.0 %  Resp Panel by RT-PCR (Flu A&B, Covid) Nasopharyngeal Swab     Status: None   Collection Time: 09/24/2020  7:30 PM   Specimen: Nasopharyngeal Swab; Nasopharyngeal(NP) swabs in vial transport medium  Result Value Ref Range   SARS Coronavirus 2 by RT PCR NEGATIVE NEGATIVE    Comment: (NOTE) SARS-CoV-2 target nucleic acids are NOT DETECTED.  The SARS-CoV-2 RNA is generally detectable in upper respiratory specimens during the acute phase of infection. The lowest concentration of SARS-CoV-2 viral copies this assay can detect is 138 copies/mL. A negative result does not preclude SARS-Cov-2 infection and should not be used as the sole basis for treatment or other patient management decisions. A negative result may occur with  improper specimen collection/handling, submission of specimen other than nasopharyngeal swab, presence of viral mutation(s) within the areas targeted by this assay, and inadequate number of viral copies(<138 copies/mL). A negative result must be combined with clinical observations, patient history, and epidemiological information. The expected result is Negative.  Fact Sheet for Patients:  EntrepreneurPulse.com.au  Fact Sheet for Healthcare Providers:  IncredibleEmployment.be  This test is no t yet approved or cleared by the [place] FDA and  has been authorized for detection and/or diagnosis of SARS-CoV-2  by FDA under an Emergency Use Authorization (EUA). This EUA will remain  in effect (meaning this test can be used) for the duration of the COVID-19 declaration under Section 564(b)(1) of the Act, 21 U.S.C.section 360bbb-3(b)(1), unless the authorization is terminated  or revoked sooner.       Influenza A by PCR NEGATIVE NEGATIVE   Influenza B by PCR NEGATIVE NEGATIVE    Comment: (NOTE) The Xpert  Xpress SARS-CoV-2/FLU/RSV plus assay is intended as an aid in the diagnosis of influenza from Nasopharyngeal swab specimens and should not be used as a sole basis for treatment. Nasal washings and aspirates are unacceptable for Xpert Xpress SARS-CoV-2/FLU/RSV testing.  Fact Sheet for Patients: EntrepreneurPulse.com.au  Fact Sheet for Healthcare Providers: IncredibleEmployment.be  This test is not yet approved or cleared by the [place] FDA and has been authorized for detection and/or diagnosis of SARS-CoV-2 by FDA under an Emergency Use Authorization (EUA). This EUA will remain in effect (meaning this test can be used) for the duration of the COVID-19 declaration under Section 564(b)(1) of the Act, 21 U.S.C. section 360bbb-3(b)(1), unless the authorization is terminated or revoked.  Performed at [facility], [place] [address], [place], [place] [address]   Lactic acid, plasma     Status: None   Collection Time: 10/06/2020  9:37 PM  Result Value Ref Range   Lactic Acid, Venous 1.8 0.5 - 1.9 mmol/L    Comment: Performed at [facility], [place] [address] [place], [place] [address]  MRSA PCR Screening     Status: None   Collection Time: 10/04/20 12:18 AM   Specimen: Nasopharyngeal  Result Value Ref Range   MRSA by PCR NEGATIVE NEGATIVE    Comment:        The GeneXpert MRSA Assay (FDA approved for NASAL specimens only), is one component of a comprehensive MRSA colonization surveillance program. It is  not intended to diagnose MRSA infection nor to guide or monitor treatment for MRSA infections. Performed at [facility], [place] [address], [place], [place] [address]   CBC     Status: Abnormal   Collection Time: 10/04/20  2:46 AM  Result Value Ref Range   WBC 22.5 (H) 4.0 - 10.5 K/uL   RBC 3.19 (L) 4.22 - 5.81 MIL/uL   Hemoglobin 11.3 (L) 13.0 - 17.0 g/dL   HCT 34.0 (L) 39.0 - 52.0 %   MCV 106.6 (H) 80.0 - 100.0 fL   MCH 35.4 (H) 26.0 - 34.0 pg   MCHC 33.2 30.0 - 36.0 g/dL   RDW 13.1 11.5 - 15.5 %   Platelets 268 150 - 400 K/uL   nRBC 0.0 0.0 - 0.2 %    Comment: Performed at [facility], [place] [address] [place], [place] [address]  Vitamin B12     Status: None   Collection Time: 10/04/20  7:38 AM  Result Value Ref Range   Vitamin B-12 775 180 - 914 pg/mL    Comment: (NOTE) This assay is not validated for testing neonatal or myeloproliferative syndrome specimens for Vitamin B12 levels. Performed at [facility], [place] [address], [place], [place] [address]   Folate     Status: None   Collection Time: 10/04/20  7:38 AM  Result Value Ref Range   Folate 11.4 >5.9 ng/mL    Comment: Performed at [facility], [place] [address], [place], [place] [address]  Iron and TIBC     Status: Abnormal   Collection Time: 10/04/20  7:38 AM  Result Value Ref Range   Iron 15 (L) 45 - 182 ug/dL   TIBC 218 (L) 250 - 450 ug/dL   Saturation Ratios 7 (L) 17.9 - 39.5 %   UIBC 203 ug/dL    Comment: Performed at [facility], [place] [address], [place], [place] [address]  Ferritin     Status: None   Collection  Time: 10/04/20  7:38 AM  Result Value Ref Range   Ferritin 186 24 - 336 ng/mL    Comment: Performed at [facility], [place] [address] [place], [place] [address]  Reticulocytes     Status: Abnormal   Collection Time: 10/04/20  7:38 AM  Result Value Ref Range   Retic Ct Pct 1.1 0.4 - 3.1  %   RBC. 3.15 (L) 4.22 - 5.81 MIL/uL   Retic Count, Absolute 35.3 19.0 - 186.0 K/uL   Immature Retic Fract 14.6 2.3 - 15.9 %    Comment: Performed at [facility], [place] [address] [place], [place] [address]  Hemoglobin A1c     Status: None   Collection Time: 10/04/20  7:38 AM  Result Value Ref Range   Hgb A1c MFr Bld 5.4 4.8 - 5.6 %    Comment: (NOTE) Pre diabetes:          5.7%-6.4%  Diabetes:              >6.4%  Glycemic control for   <7.0% adults with diabetes    Mean Plasma Glucose 108.28 mg/dL    Comment: Performed at [place] [address], [place], [place] [address]  Basic metabolic panel     Status: Abnormal   Collection Time: 10/04/20  7:38 AM  Result Value Ref Range   Sodium 138 135 - 145 mmol/L   Potassium 4.5 3.5 - 5.1 mmol/L   Chloride 103 98 - 111 mmol/L   CO2 25 22 - 32 mmol/L   Glucose, Bld 158 (H) 70 - 99 mg/dL    Comment: Glucose reference range applies only to samples taken after fasting for at least 8 hours.   BUN 28 (H) 8 - 23 mg/dL   Creatinine, Ser 1.01 0.61 - 1.24 mg/dL   Calcium 9.4 8.9 - 10.3 mg/dL   GFR, Estimated >60 >60 mL/min    Comment: (NOTE) Calculated using the CKD-EPI Creatinine Equation (2021)    Anion gap 10 5 - 15    Comment: Performed at [facility], [place] [address], [place], [place] [address]  Procalcitonin - Baseline     Status: None   Collection Time: 10/04/20  7:38 AM  Result Value Ref Range   Procalcitonin 0.93 ng/mL    Comment:        Interpretation: PCT > 0.5 ng/mL and <= 2 ng/mL: Systemic infection (sepsis) is possible, but other conditions are known to elevate PCT as well. (NOTE)       Sepsis PCT Algorithm           Lower Respiratory Tract                                      Infection PCT Algorithm    ----------------------------     ----------------------------         PCT < 0.25 ng/mL                PCT < 0.10 ng/mL          Strongly encourage             Strongly  discourage   discontinuation of antibiotics    initiation of antibiotics    ----------------------------     -----------------------------       PCT 0.25 - 0.50 ng/mL  PCT 0.10 - 0.25 ng/mL               OR       >80% decrease in PCT            Discourage initiation of                                            antibiotics      Encourage discontinuation           of antibiotics    ----------------------------     -----------------------------         PCT >= 0.50 ng/mL              PCT 0.26 - 0.50 ng/mL                AND       <80% decrease in PCT             Encourage initiation of                                             antibiotics       Encourage continuation           of antibiotics    ----------------------------     -----------------------------        PCT >= 0.50 ng/mL                  PCT > 0.50 ng/mL               AND         increase in PCT                  Strongly encourage                                      initiation of antibiotics    Strongly encourage escalation           of antibiotics                                     -----------------------------                                           PCT <= 0.25 ng/mL                                                 OR                                        > 80% decrease in PCT  Discontinue / Do not initiate                                             antibiotics  Performed at [place] [address], [place], [place] [address]   Glucose, capillary     Status: Abnormal   Collection Time: 10/04/20  9:04 AM  Result Value Ref Range   Glucose-Capillary 150 (H) 70 - 99 mg/dL    Comment: Glucose reference range applies only to samples taken after fasting for at least 8 hours.  Blood gas, arterial     Status: Abnormal   Collection Time: 10/04/20  9:20 AM  Result Value Ref Range   pH, Arterial 7.168 (LL) 7.350 - 7.450    Comment: CRITICAL RESULT  CALLED TO, READ BACK BY AND VERIFIED WITH: Josph Macho RN AT [phone] ON 10/04/20 BY S.VANHOORNE    pCO2 arterial 73.0 (HH) 32.0 - 48.0 mmHg    Comment: CRITICAL RESULT CALLED TO, READ BACK BY AND VERIFIED WITH: Josph Macho RN AT [phone] ON 10/04/20 BY S.VANHOORNE    pO2, Arterial 120 (H) 83.0 - 108.0 mmHg   Bicarbonate 25.4 20.0 - 28.0 mmol/L   Acid-base deficit 4.0 (H) 0.0 - 2.0 mmol/L   O2 Saturation 97.7 %   Patient temperature 98.6     Comment: Performed at [facility], [place] [address], [place], [place] [address]  Brain natriuretic peptide     Status: Abnormal   Collection Time: 10/04/20  9:52 AM  Result Value Ref Range   B Natriuretic Peptide 1,452.3 (H) 0.0 - 100.0 pg/mL    Comment: Performed at [facility], [place] [address] [place], [place] [address]    CT HEAD WO CONTRAST  Result Date: 10/04/2020 CLINICAL DATA:  Difficulty swallowing EXAM: CT HEAD WITHOUT CONTRAST TECHNIQUE: Contiguous axial images were obtained from the base of the skull through the vertex without intravenous contrast. COMPARISON:  09/25/2020, 06/15/2016 FINDINGS: Brain: Confluent hypodensities throughout the periventricular and subcortical white matter consistent with chronic small vessel ischemic changes, stable. Calcifications within the bilateral basal ganglia and cerebellum unchanged. No acute infarct or hemorrhage. Lateral ventricles and midline structures are unremarkable. No acute extra-axial fluid collections. No mass effect. Vascular: Diffuse atherosclerosis throughout the internal carotid arteries. No hyperdense vessel. Skull: Normal. Negative for fracture or focal lesion. Sinuses/Orbits: No acute finding. Other: None. IMPRESSION: 1. Chronic small vessel ischemic changes. No acute intracranial process. Electronically Signed   By: Randa Ngo M.D.   On: 10/13/2020 23:50   CT Soft Tissue Neck W Contrast  Result Date: 09/28/2020 CLINICAL DATA:  Sensation of something  stuck in the throat. Question epiglottitis or tonsillitis. EXAM: CT NECK WITH CONTRAST TECHNIQUE: Multidetector CT imaging of the neck was performed using the standard protocol following the bolus administration of intravenous contrast. CONTRAST:  68mL OMNIPAQUE IOHEXOL 300 MG/ML  SOLN COMPARISON:  None. FINDINGS: Pharynx and larynx: No evidence of mucosal or submucosal mass or inflammatory disease. No sign of any foreign object. The upper thoracic esophagus is somewhat dilated and fluid-filled. Commonly, patients with presbyesophagus can have the sensation something stuck in the throat. Salivary glands: Parotid and submandibular glands are normal. Thyroid: Normal Lymph nodes: No adenopathy. Vascular: Ordinary atherosclerotic calcification at the carotid bifurcations. Limited intracranial: No significant finding. Visualized orbits: Normal Mastoids and visualized paranasal sinuses: Clear Skeleton: Ordinary cervical  and thoracic spondylosis. No advanced or acute finding. Upper chest: Minimal scarring.  No active process. Other: None IMPRESSION: No sign of foreign object. No evidence of mass or inflammatory disease. The upper thoracic esophagus is somewhat dilated and fluid-filled. Commonly, patients with presbyesophagus can have the sensation something stuck in the throat. Electronically Signed   By: Nelson Chimes M.D.   On: 10/04/2020 18:43   DG CHEST PORT 1 VIEW  Result Date: 10/04/2020 CLINICAL DATA:  Shortness of breath EXAM: PORTABLE CHEST 1 VIEW COMPARISON:  10/11/2020 FINDINGS: Stable cardiomegaly. Atherosclerotic calcification of the aortic knob. Marked progression of airspace opacity throughout the left lung, most pronounced within the left perihilar and left basilar region. Suspect small left pleural effusion. Mild streaky right basilar opacity, also slightly increased. No pneumothorax. IMPRESSION: Marked progression of airspace opacity throughout the left lung, most pronounced within the left perihilar  and left basilar regions. Findings concerning for worsening multifocal pneumonia and/or asymmetric edema. Acute respiratory distress syndrome is also a consideration given the rapid progression, although findings are more asymmetric than would be typically seen in the setting of ARDS. Electronically Signed   By: Davina Poke D.O.   On: 10/04/2020 10:06   DG Chest Port 1 View  Result Date: 10/02/2020 CLINICAL DATA:  Feels like something is stuck in throat, stridor EXAM: PORTABLE CHEST 1 VIEW COMPARISON:  Portable exam 1830 hours compared to 12/17/2011 FINDINGS: Enlargement of cardiac silhouette. Mediastinal contours and pulmonary vascularity normal. Atherosclerotic calcification aorta. LEFT lower lobe infiltrate consistent with pneumonia. Underlying chronic lung changes again identified. No pleural effusion or pneumothorax. Bones demineralized. IMPRESSION: Enlargement of cardiac silhouette. Chronic lung disease with LEFT lower lobe pneumonia. Electronically Signed   By: Lavonia Dana M.D.   On: 10/11/2020 18:36    WEX:[id number] except as listed in admit H&P  Blood pressure (!) 152/72, pulse (!) 109, temperature 97.9 F (36.6 C), temperature source Axillary, resp. rate (!) 23, height 5\' 5"  (1.651 m), weight 59 kg, SpO2 92 %.  PHYSICAL EXAM: Overall appearance: Elderly gentleman sitting up, tachypneic, in mild to moderate respiratory distress.  His breathing is a little bit noisy on inspiration.  He is able to say a couple of words at a time and his voice is somewhat hoarse. Head:  Normocephalic, atraumatic. Ears: External ears look healthy. Nose: External nose is healthy in appearance. Internal nasal exam free of any lesions or obstruction. Oral Cavity/Pharynx:  There are no mucosal lesions or masses identified. Larynx/Hypopharynx: See below Neuro:  No identifiable neurologic deficits. Neck: No palpable neck masses.  Studies Reviewed: CT of neck and chest x-ray  Procedures: Flexible fiberoptic  laryngoscopy.  Topical Afrin/Xylocaine spray was applied to the nasal cavities.  The flexible fiberoptic scope was passed down the right nasal cavity.  The nasal cavities clear but the mucosa is diffusely very dry.  The nasopharynx is clear.  Oropharynx and hypopharynx are clear as well.  The arytenoid mucosa and the post cricoid mucosa is moderately to severely edematous.  The true vocal cords are somewhat edematous but they appear to be mostly immobile.  The false cords are abducting and abducting with respirations.  There is no pooling of secretions.  Subglottis is not visualized.   Assessment/Plan: 1.  Aspiration pneumonia (J69.0).  He had not had any problems drinking liquids prior to being admitted to the hospital.  2.  Respiratory distress.  (R06.03)  there may be a component of laryngeal obstruction.  3.  Laryngeal edema, esophageal edema.  (  J38.4) this may be reflux in origin.  I discussed with the daughter that alcohol, caffeine, and chocolate can cause this.  4.  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  CT scan of the brain was negative for acute process.  The only potential treatment for this would be either intubation or tracheostomy.  At this moment he is not interested in pursuing either of those.  I explained to the daughter that I am available all weekend in case they decide they would like to pursue tracheostomy.  Izora Gala 10/04/2020, 11:20 AM

## 2020-10-05 DIAGNOSIS — J3802 Paralysis of vocal cords and larynx, bilateral: Secondary | ICD-10-CM | POA: Diagnosis present

## 2020-10-05 DIAGNOSIS — Z66 Do not resuscitate: Secondary | ICD-10-CM | POA: Diagnosis present

## 2020-10-05 DIAGNOSIS — R6520 Severe sepsis without septic shock: Secondary | ICD-10-CM | POA: Diagnosis present

## 2020-10-05 DIAGNOSIS — A419 Sepsis, unspecified organism: Secondary | ICD-10-CM | POA: Diagnosis present

## 2020-10-05 DIAGNOSIS — Z515 Encounter for palliative care: Secondary | ICD-10-CM

## 2020-10-05 DIAGNOSIS — I5021 Acute systolic (congestive) heart failure: Secondary | ICD-10-CM | POA: Diagnosis present

## 2020-10-09 LAB — CULTURE, BLOOD (ROUTINE X 2)
Culture: NO GROWTH
Culture: NO GROWTH
Special Requests: ADEQUATE
Special Requests: ADEQUATE

## 2020-10-15 NOTE — Progress Notes (Signed)
Patient passed at 31, daughter Macky Lower at bedside. MD notified.

## 2020-10-15 NOTE — Death Summary Note (Signed)
DEATH SUMMARY   Patient Details  Name: Cory Burton MRN: [id number] DOB: [birth year]  Admission/Discharge Information   Admit Date:  2020/10/25  Date of Death: Date of Death: 10/27/2020  Time of Death: Time of Death: 0906  Length of Stay: 2  Referring Physician: Mast, Man X, NP   Reason(s) for Hospitalization  Respiratory distress  Diagnoses  Preliminary cause of death: Acute respiratory failure with hypoxia and hypercapnia ([place]) Secondary Diagnoses (including complications and co-morbidities):  Principal Problem:   Abnormal chest sounds Active Problems:   BPH (benign prostatic hyperplasia)   Hypothyroidism   Pneumonia   Sepsis ([place])   Severe sepsis ([place])   Vocal cord paralysis, bilateral complete   End of life care   DNR (do not resuscitate)   Acute systolic CHF (congestive heart failure) [facility])   Brief Hospital Course (including significant findings, care, treatment, and services provided and events leading to death)  87 year old M with PMH of HTN, BPH, hypothyroidism, cardiomegaly, IDA, osteoporosis, lumbar spinal stenosis and thoracolumbar DDD brought to ED by EMS with respiratory distress.  EMS noted stridor on arrival.  He was given racemic epinephrine and started on supplemental oxygen.  He was admitted for upper airway issue of unknown cause and sepsis due to LLL pneumonia.  PCCM consulted.  Patient met criteria for severe sepsis on admission.  He had progressive respiratory failure with hypoxia and hypercapnia the next morning.  Limited by PCCM and ENT.  He had a laryngoscopy that revealed bilateral vocal cord paralysis and laryngeal edema and esophageal edema.  The only potential treatment for this would be either intubation or tracheostomy.  However, patient and patient's family member refused both interventions, and decided to pursue full comfort care.  Pertinent Labs and Studies  Significant Diagnostic Studies CT HEAD WO CONTRAST  Result Date: 25-Oct-2020 CLINICAL  DATA:  Difficulty swallowing EXAM: CT HEAD WITHOUT CONTRAST TECHNIQUE: Contiguous axial images were obtained from the base of the skull through the vertex without intravenous contrast. COMPARISON:  25-Oct-2020, 06/15/2016 FINDINGS: Brain: Confluent hypodensities throughout the periventricular and subcortical white matter consistent with chronic small vessel ischemic changes, stable. Calcifications within the bilateral basal ganglia and cerebellum unchanged. No acute infarct or hemorrhage. Lateral ventricles and midline structures are unremarkable. No acute extra-axial fluid collections. No mass effect. Vascular: Diffuse atherosclerosis throughout the internal carotid arteries. No hyperdense vessel. Skull: Normal. Negative for fracture or focal lesion. Sinuses/Orbits: No acute finding. Other: None. IMPRESSION: 1. Chronic small vessel ischemic changes. No acute intracranial process. Electronically Signed   By: Randa Ngo M.D.   On: 10-25-2020 23:50   CT Soft Tissue Neck W Contrast  Result Date: 10/25/2020 CLINICAL DATA:  Sensation of something stuck in the throat. Question epiglottitis or tonsillitis. EXAM: CT NECK WITH CONTRAST TECHNIQUE: Multidetector CT imaging of the neck was performed using the standard protocol following the bolus administration of intravenous contrast. CONTRAST:  30mL OMNIPAQUE IOHEXOL 300 MG/ML  SOLN COMPARISON:  None. FINDINGS: Pharynx and larynx: No evidence of mucosal or submucosal mass or inflammatory disease. No sign of any foreign object. The upper thoracic esophagus is somewhat dilated and fluid-filled. Commonly, patients with presbyesophagus can have the sensation something stuck in the throat. Salivary glands: Parotid and submandibular glands are normal. Thyroid: Normal Lymph nodes: No adenopathy. Vascular: Ordinary atherosclerotic calcification at the carotid bifurcations. Limited intracranial: No significant finding. Visualized orbits: Normal Mastoids and visualized paranasal  sinuses: Clear Skeleton: Ordinary cervical and thoracic spondylosis. No advanced or acute finding. Upper chest: Minimal scarring.  No active process. Other: None IMPRESSION: No sign of foreign object. No evidence of mass or inflammatory disease. The upper thoracic esophagus is somewhat dilated and fluid-filled. Commonly, patients with presbyesophagus can have the sensation something stuck in the throat. Electronically Signed   By: Nelson Chimes M.D.   On: 10/10/2020 18:43   DG CHEST PORT 1 VIEW  Result Date: 10/04/2020 CLINICAL DATA:  Shortness of breath EXAM: PORTABLE CHEST 1 VIEW COMPARISON:  10/11/2020 FINDINGS: Stable cardiomegaly. Atherosclerotic calcification of the aortic knob. Marked progression of airspace opacity throughout the left lung, most pronounced within the left perihilar and left basilar region. Suspect small left pleural effusion. Mild streaky right basilar opacity, also slightly increased. No pneumothorax. IMPRESSION: Marked progression of airspace opacity throughout the left lung, most pronounced within the left perihilar and left basilar regions. Findings concerning for worsening multifocal pneumonia and/or asymmetric edema. Acute respiratory distress syndrome is also a consideration given the rapid progression, although findings are more asymmetric than would be typically seen in the setting of ARDS. Electronically Signed   By: Davina Poke D.O.   On: 10/04/2020 10:06   DG Chest Port 1 View  Result Date: 09/25/2020 CLINICAL DATA:  Feels like something is stuck in throat, stridor EXAM: PORTABLE CHEST 1 VIEW COMPARISON:  Portable exam 1830 hours compared to 12/17/2011 FINDINGS: Enlargement of cardiac silhouette. Mediastinal contours and pulmonary vascularity normal. Atherosclerotic calcification aorta. LEFT lower lobe infiltrate consistent with pneumonia. Underlying chronic lung changes again identified. No pleural effusion or pneumothorax. Bones demineralized. IMPRESSION:  Enlargement of cardiac silhouette. Chronic lung disease with LEFT lower lobe pneumonia. Electronically Signed   By: Lavonia Dana M.D.   On: 09/23/2020 18:36   ECHOCARDIOGRAM COMPLETE  Result Date: 10/04/2020    ECHOCARDIOGRAM REPORT   Patient Name:   GABLE ODONOHUE Date of Exam: 10/04/2020 Medical Rec #:  [id number]   Height:       65.0 in Accession #:    [id number]  Weight:       130.1 lb Date of Birth:  1918-05-19   BSA:          1.648 m Patient Age:    89 years   BP:           162/75 mmHg Patient Gender: M           HR:           105 bpm. Exam Location:  Inpatient Procedure: 2D Echo, Cardiac Doppler and Color Doppler                             MODIFIED REPORT: This report was modified by Candee Furbish MD on 10/04/2020 due to Added WMA.  Indications:     Dyspnea  History:         Patient has no prior history of Echocardiogram examinations.                  Signs/Symptoms:Dyspnea. PNA, sepsis, spinal stenosis.  Sonographer:     Dustin Flock Referring Phys:  [id number] Charlesetta Ivory Derral Colucci Diagnosing Phys: Candee Furbish MD  Sonographer Comments: Diffilcult due to patients spinal stenosis and frail body habitus. Palliative care consulted IMPRESSIONS  1. Left ventricular ejection fraction, by estimation, is 45 to 50%. The left ventricle has mildly decreased function. The left ventricle demonstrates regional wall motion abnormalities (see scoring diagram/findings for description). Left ventricular diastolic parameters are consistent with Grade I diastolic dysfunction (impaired relaxation).  2. Right ventricular systolic function is normal. The right ventricular size is normal.  3. Left atrial size was mildly dilated.  4. The mitral valve is normal in structure. No evidence of mitral valve regurgitation. No evidence of mitral stenosis.  5. The aortic valve is tricuspid. Aortic valve regurgitation is not visualized. Mild aortic valve sclerosis is present, with no evidence of aortic valve stenosis.  6. The inferior vena cava is  normal in size with greater than 50% respiratory variability, suggesting right atrial pressure of 3 mmHg. FINDINGS  Left Ventricle: Left ventricular ejection fraction, by estimation, is 45 to 50%. The left ventricle has mildly decreased function. The left ventricle demonstrates regional wall motion abnormalities. The left ventricular internal cavity size was normal in size. There is no left ventricular hypertrophy. Left ventricular diastolic parameters are consistent with Grade I diastolic dysfunction (impaired relaxation).  LV Wall Scoring: The mid and distal anterior septum and mid inferoseptal segment are akinetic. Right Ventricle: The right ventricular size is normal. No increase in right ventricular wall thickness. Right ventricular systolic function is normal. Left Atrium: Left atrial size was mildly dilated. Right Atrium: Right atrial size was normal in size. Pericardium: There is no evidence of pericardial effusion. Mitral Valve: The mitral valve is normal in structure. No evidence of mitral valve regurgitation. No evidence of mitral valve stenosis. Tricuspid Valve: The tricuspid valve is normal in structure. Tricuspid valve regurgitation is not demonstrated. No evidence of tricuspid stenosis. Aortic Valve: The aortic valve is tricuspid. Aortic valve regurgitation is not visualized. Mild aortic valve sclerosis is present, with no evidence of aortic valve stenosis. Pulmonic Valve: The pulmonic valve was normal in structure. Pulmonic valve regurgitation is not visualized. No evidence of pulmonic stenosis. Aorta: The aortic root is normal in size and structure. Venous: The inferior vena cava is normal in size with greater than 50% respiratory variability, suggesting right atrial pressure of 3 mmHg. IAS/Shunts: No atrial level shunt detected by color flow Doppler.  LEFT VENTRICLE PLAX 2D LVIDd:         5.10 cm  Diastology LVIDs:         3.90 cm  LV e' medial:    23.50 cm/s LV PW:         1.00 cm  LV E/e' medial:   5.0 LV IVS:        0.90 cm  LV e' lateral:   16.20 cm/s LVOT diam:     1.80 cm  LV E/e' lateral: 7.3 LV SV:         50 LV SV Index:   30 LVOT Area:     2.54 cm  RIGHT VENTRICLE RV Basal diam:  3.80 cm RV S prime:     12.30 cm/s TAPSE (M-mode): 2.7 cm LEFT ATRIUM           Index       RIGHT ATRIUM           Index LA diam:      4.40 cm 2.67 cm/m  RA Area:     16.40 cm LA Vol (A2C): 38.6 ml 23.43 ml/m RA Volume:   47.30 ml  28.71 ml/m LA Vol (A4C): 60.8 ml 36.90 ml/m  AORTIC VALVE LVOT Vmax:   97.90 cm/s LVOT Vmean:  67.700 cm/s LVOT VTI:    0.196 m  AORTA Ao Root diam: 3.00 cm MITRAL VALVE MV Area (PHT): 5.54 cm     SHUNTS MV Decel Time: 137 msec  Systemic VTI:  0.20 m MV E velocity: 118.00 cm/s  Systemic Diam: 1.80 cm Donato Schultz MD Electronically signed by Donato Schultz MD Signature Date/Time: 10/04/2020/3:54:02 PM    Final (Updated)     Microbiology Recent Results (from the past 240 hour(s))  Resp Panel by RT-PCR (Flu A&B, Covid) Nasopharyngeal Swab     Status: None   Collection Time: 10/14/2020  7:30 PM   Specimen: Nasopharyngeal Swab; Nasopharyngeal(NP) swabs in vial transport medium  Result Value Ref Range Status   SARS Coronavirus 2 by RT PCR NEGATIVE NEGATIVE Final    Comment: (NOTE) SARS-CoV-2 target nucleic acids are NOT DETECTED.  The SARS-CoV-2 RNA is generally detectable in upper respiratory specimens during the acute phase of infection. The lowest concentration of SARS-CoV-2 viral copies this assay can detect is 138 copies/mL. A negative result does not preclude SARS-Cov-2 infection and should not be used as the sole basis for treatment or other patient management decisions. A negative result may occur with  improper specimen collection/handling, submission of specimen other than nasopharyngeal swab, presence of viral mutation(s) within the areas targeted by this assay, and inadequate number of viral copies(<138 copies/mL). A negative result must be combined with clinical  observations, patient history, and epidemiological information. The expected result is Negative.  Fact Sheet for Patients:  BloggerCourse.com  Fact Sheet for Healthcare Providers:  SeriousBroker.it  This test is no t yet approved or cleared by the [place] FDA and  has been authorized for detection and/or diagnosis of SARS-CoV-2 by FDA under an Emergency Use Authorization (EUA). This EUA will remain  in effect (meaning this test can be used) for the duration of the COVID-19 declaration under Section 564(b)(1) of the Act, 21 U.S.C.section 360bbb-3(b)(1), unless the authorization is terminated  or revoked sooner.       Influenza A by PCR NEGATIVE NEGATIVE Final   Influenza B by PCR NEGATIVE NEGATIVE Final    Comment: (NOTE) The Xpert Xpress SARS-CoV-2/FLU/RSV plus assay is intended as an aid in the diagnosis of influenza from Nasopharyngeal swab specimens and should not be used as a sole basis for treatment. Nasal washings and aspirates are unacceptable for Xpert Xpress SARS-CoV-2/FLU/RSV testing.  Fact Sheet for Patients: BloggerCourse.com  Fact Sheet for Healthcare Providers: SeriousBroker.it  This test is not yet approved or cleared by the [place] FDA and has been authorized for detection and/or diagnosis of SARS-CoV-2 by FDA under an Emergency Use Authorization (EUA). This EUA will remain in effect (meaning this test can be used) for the duration of the COVID-19 declaration under Section 564(b)(1) of the Act, 21 U.S.C. section 360bbb-3(b)(1), unless the authorization is terminated or revoked.  Performed at [facility], [address] [place], [place] [address]   Blood culture (routine x 2)     Status: None (Preliminary result)   Collection Time: 09/19/2020  7:30 PM   Specimen: BLOOD  Result Value Ref Range Status   Specimen Description    Final    BLOOD RIGHT ANTECUBITAL Performed at [facility], [address] [place], [place] [address]    Special Requests   Final    BOTTLES DRAWN AEROBIC AND ANAEROBIC Blood Culture adequate volume Performed at [facility], [address] [place], [place] [address]    Culture   Final    NO GROWTH 1 DAY Performed at [facility], [address] [place], [place] [address]    Report Status PENDING  Incomplete  Blood  culture (routine x 2)     Status: None (Preliminary result)   Collection Time: 09/18/2020  9:37 PM   Specimen: BLOOD  Result Value Ref Range Status   Specimen Description   Final    BLOOD LEFT ARM Performed at [facility] [address], [place], [place] [address]    Special Requests   Final    BOTTLES DRAWN AEROBIC ONLY Blood Culture adequate volume Performed at [facility] [address] [place], [place] [address]    Culture   Final    NO GROWTH 1 DAY Performed at [facility], [place] [address], [place], [place] [address]    Report Status PENDING  Incomplete  MRSA PCR Screening     Status: None   Collection Time: 10/04/20 12:18 AM   Specimen: Nasopharyngeal  Result Value Ref Range Status   MRSA by PCR NEGATIVE NEGATIVE Final    Comment:        The GeneXpert MRSA Assay (FDA approved for NASAL specimens only), is one component of a comprehensive MRSA colonization surveillance program. It is not intended to diagnose MRSA infection nor to guide or monitor treatment for MRSA infections. Performed at [facility], [place] [address], [place], [place] [address]     Lab Basic Metabolic Panel: Recent Labs  Lab 09/29/2020 1705 09/30/2020 1752 10/04/20 0738  NA 137 136 138  K 4.0 3.9 4.5  CL 101 101 103  CO2 26  --  25  GLUCOSE 198* 197* 158*  BUN 25* 26* 28*  CREATININE 0.91 1.00 1.01  CALCIUM 9.7  --  9.4   Liver Function Tests: No results for  input(s): AST, ALT, ALKPHOS, BILITOT, PROT, ALBUMIN in the last 168 hours. No results for input(s): LIPASE, AMYLASE in the last 168 hours. No results for input(s): AMMONIA in the last 168 hours. CBC: Recent Labs  Lab 10/10/2020 1705 09/28/2020 1752 10/04/20 0246  WBC 19.1*  --  22.5*  NEUTROABS 16.7*  --   --   HGB 10.9* 11.2* 11.3*  HCT 33.7* 33.0* 34.0*  MCV 107.3*  --  106.6*  PLT 337  --  268   Cardiac Enzymes: No results for input(s): CKTOTAL, CKMB, CKMBINDEX, TROPONINI in the last 168 hours. Sepsis Labs: Recent Labs  Lab 10/02/2020 1705 10/01/2020 2137 10/04/20 0246 10/04/20 0738  PROCALCITON  --   --   --  0.93  WBC 19.1*  --  22.5*  --   LATICACIDVEN  --  1.8  --   --     Procedures/Operations     Mercy Riding 31-Oct-2020, 5:01 PM

## 2020-10-15 DEATH — deceased

## 2020-12-19 ENCOUNTER — Encounter: Payer: Medicare Other | Admitting: Nurse Practitioner

## 2023-01-06 IMAGING — CT CT NECK W/ CM
4 series · 15 of 33 positions shown, 18 images · IV contrast (omnipaque)
Comparison: None.

CLINICAL DATA: Sensation of something stuck in the throat. Question
epiglottitis or tonsillitis.

EXAM:
CT NECK WITH CONTRAST
TECHNIQUE: Multidetector CT imaging of the neck was performed using the
standard protocol following the bolus administration of intravenous
contrast.
CONTRAST:  75mL OMNIPAQUE IOHEXOL 300 MG/ML  SOLN

[Series 2: axial neck · axial · 0.55mm/px · z∈[-370,-158]mm · 5 of 159 slices shown, 7 images]
[im 27/159  soft-tissue]
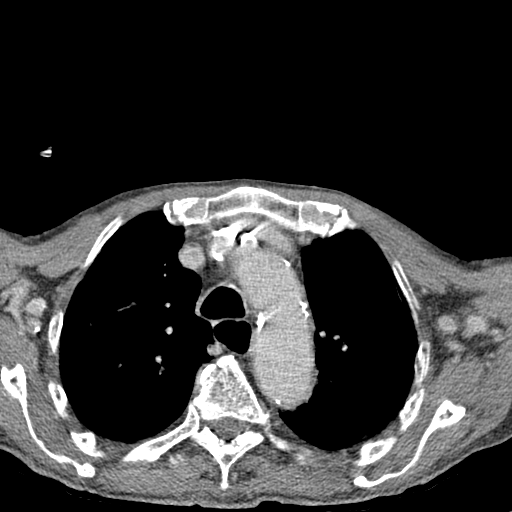
[im 27/159  bone]
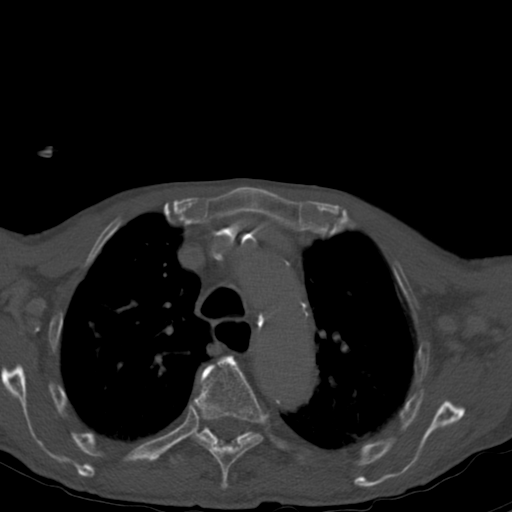
[im 53/159  bone]
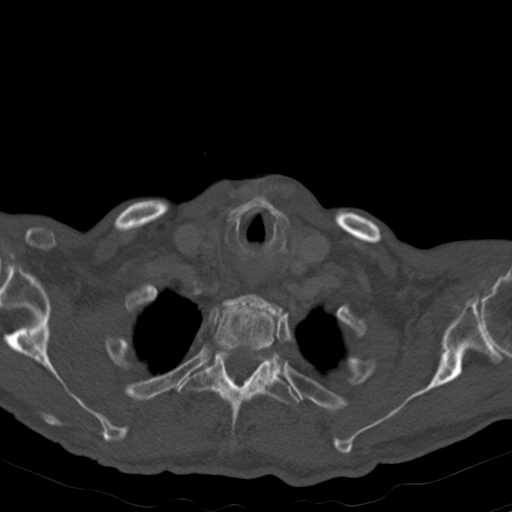
[im 80/159  bone]
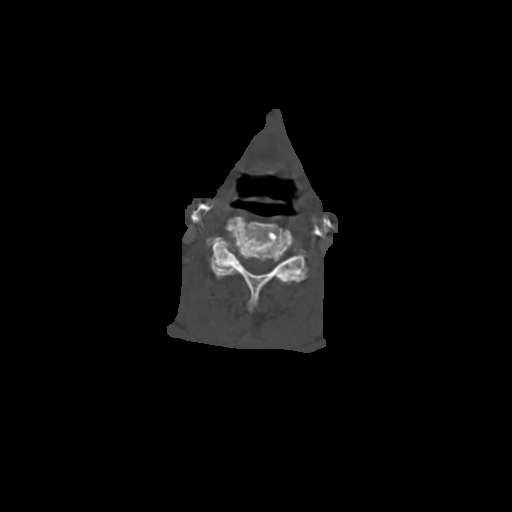
[im 106/159  bone]
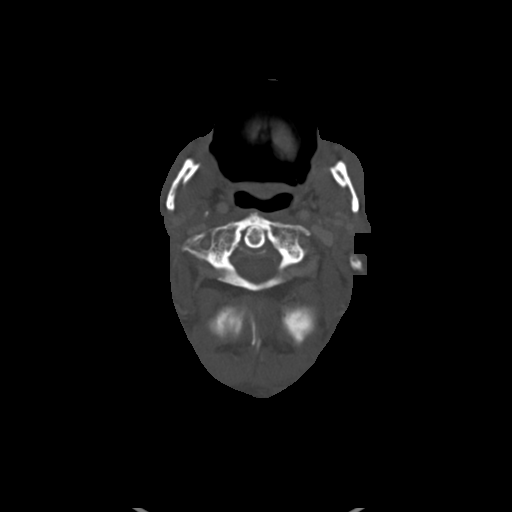
[im 132/159  soft-tissue]
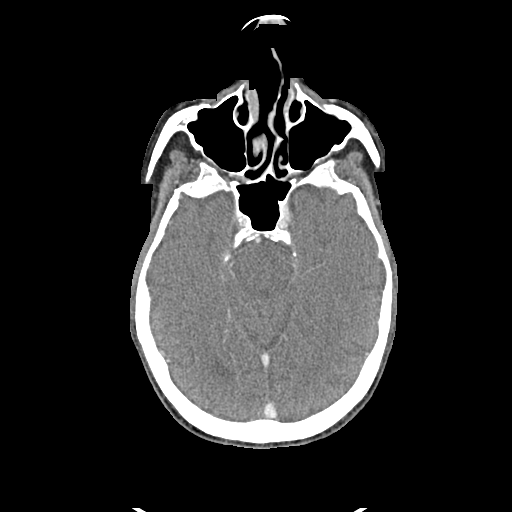
[im 132/159  bone]
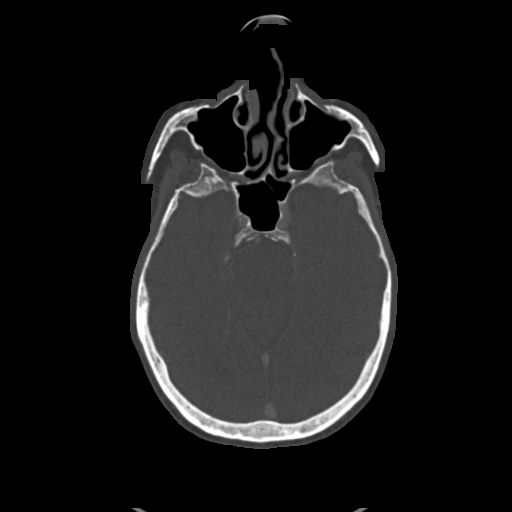

[Series 4: axial · axial · 0.42mm/px · z∈[-368,-316]mm · 2 of 157 slices shown]
[im 27/157  bone]
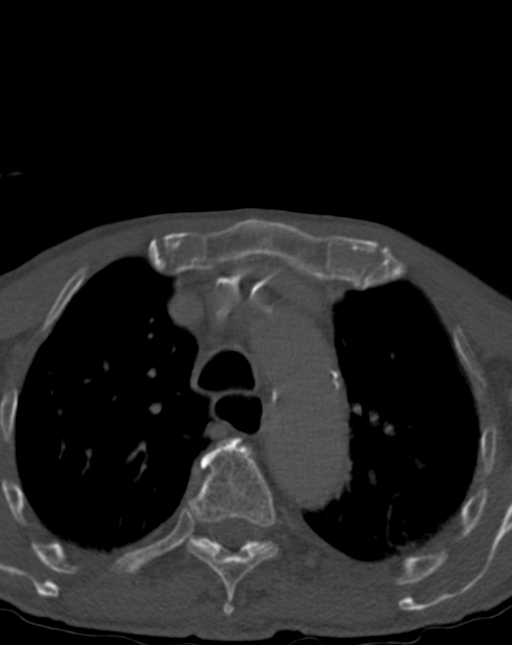
[im 53/157  bone]
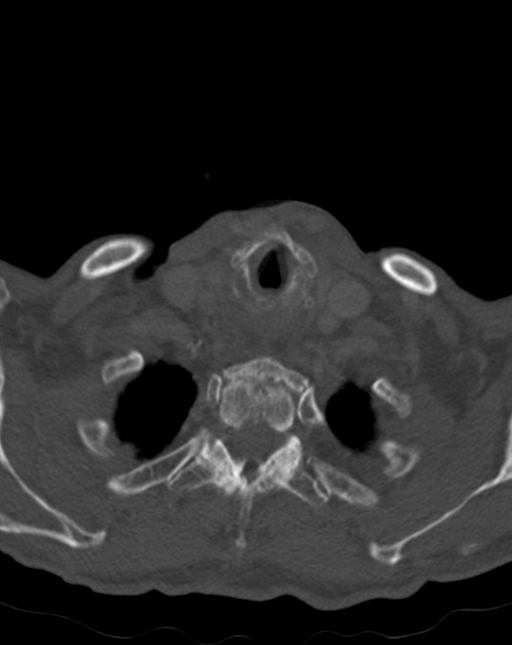

[Series 5: coronal · coronal · 0.62mm/px · 3 of 142 slices shown]
[im 29/142  bone]
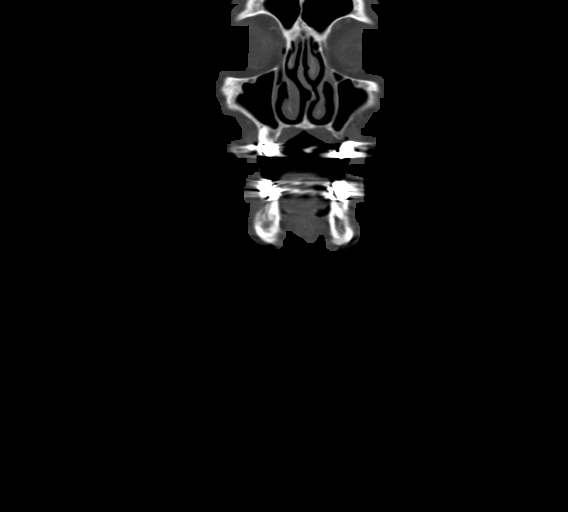
[im 57/142  bone]
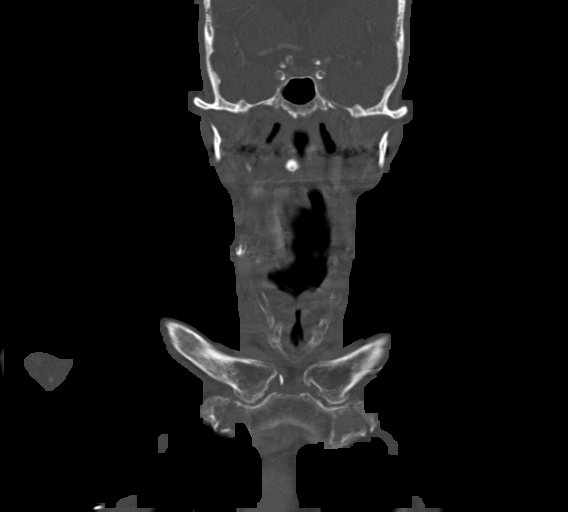
[im 85/142  bone]
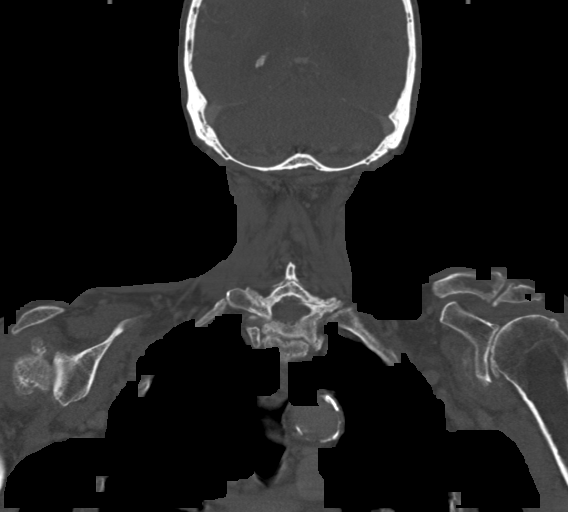

[Series 6: sagittal · sagittal · 0.62mm/px · 5 of 101 slices shown, 6 images]
[im 34/101  bone]
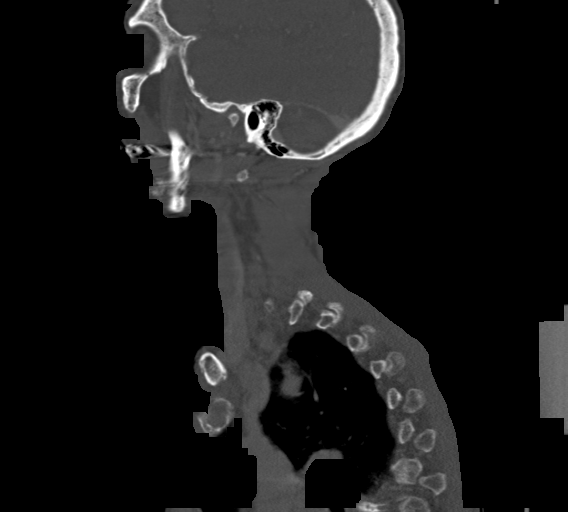
[im 42/101  bone]
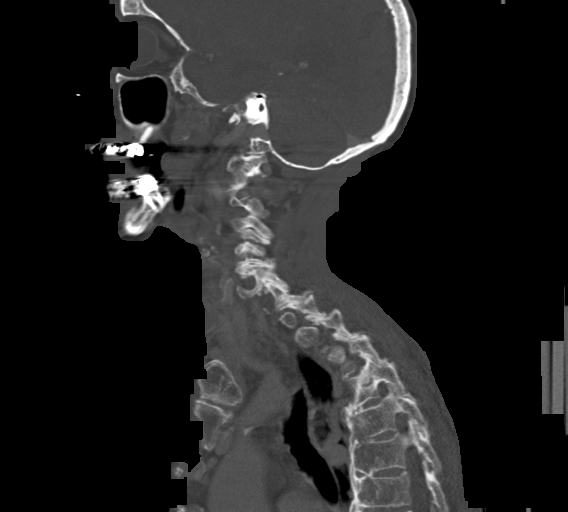
[im 51/101  soft-tissue]
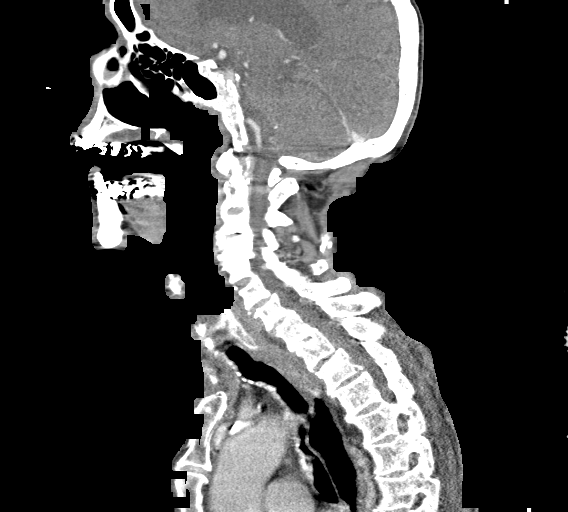
[im 51/101  bone]
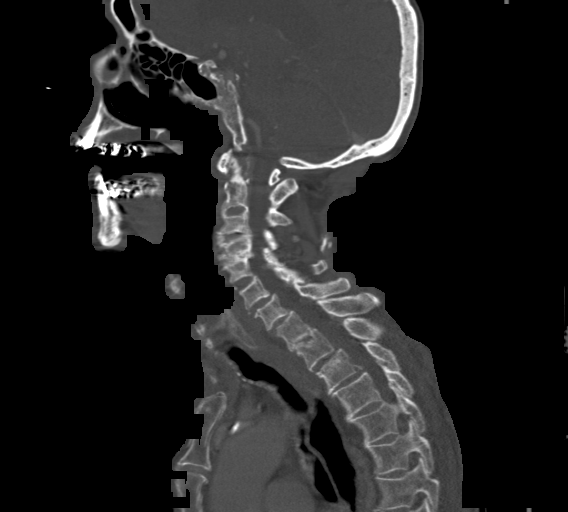
[im 59/101  bone]
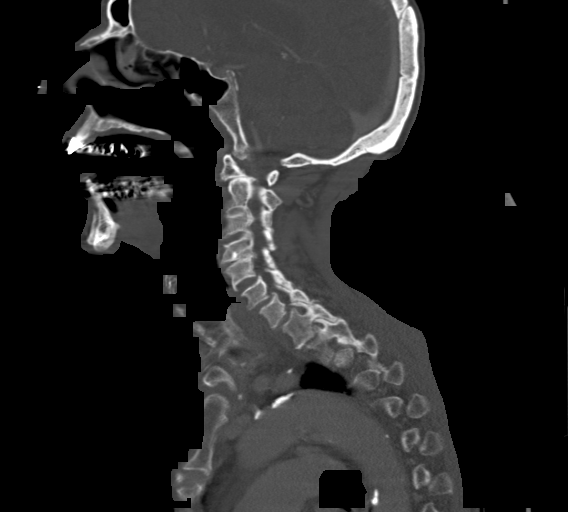
[im 67/101  bone]
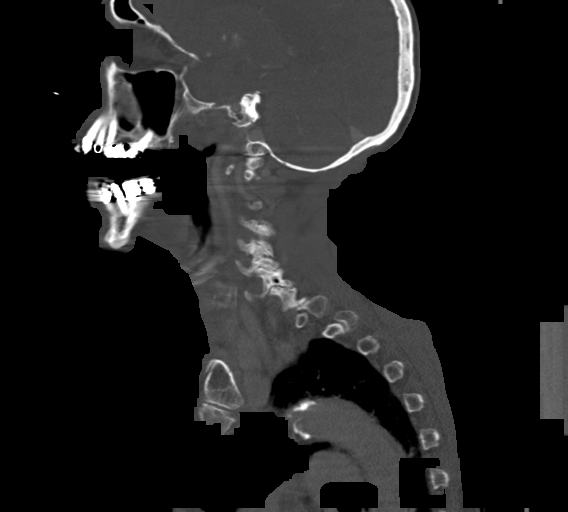

[15 of 33 positions shown; findings below may reference images not displayed]

FINDINGS: Pharynx and larynx: No evidence of mucosal or submucosal mass or
inflammatory disease. No sign of any foreign object. The upper
thoracic esophagus is somewhat dilated and fluid-filled. Commonly,
patients with presbyesophagus can have the sensation something stuck
in the throat.

Salivary glands: Parotid and submandibular glands are normal.

Thyroid: Normal

Lymph nodes: No adenopathy.

Vascular: Ordinary atherosclerotic calcification at the carotid
bifurcations.

Limited intracranial: No significant finding.

Visualized orbits: Normal

Mastoids and visualized paranasal sinuses: Clear

Skeleton: Ordinary cervical and thoracic spondylosis. No advanced or
acute finding.

Upper chest: Minimal scarring.  No active process.

Other: None
IMPRESSION: No sign of foreign object. No evidence of mass or inflammatory
disease. The upper thoracic esophagus is somewhat dilated and
fluid-filled. Commonly, patients with presbyesophagus can have the
sensation something stuck in the throat.

## 2023-01-07 IMAGING — DX DG CHEST 1V PORT
1 series · 1 of 1 positions shown · non-contrast
Comparison: 10/03/2020

CLINICAL DATA: Shortness of breath

EXAM:
PORTABLE CHEST 1 VIEW

[chest ap]
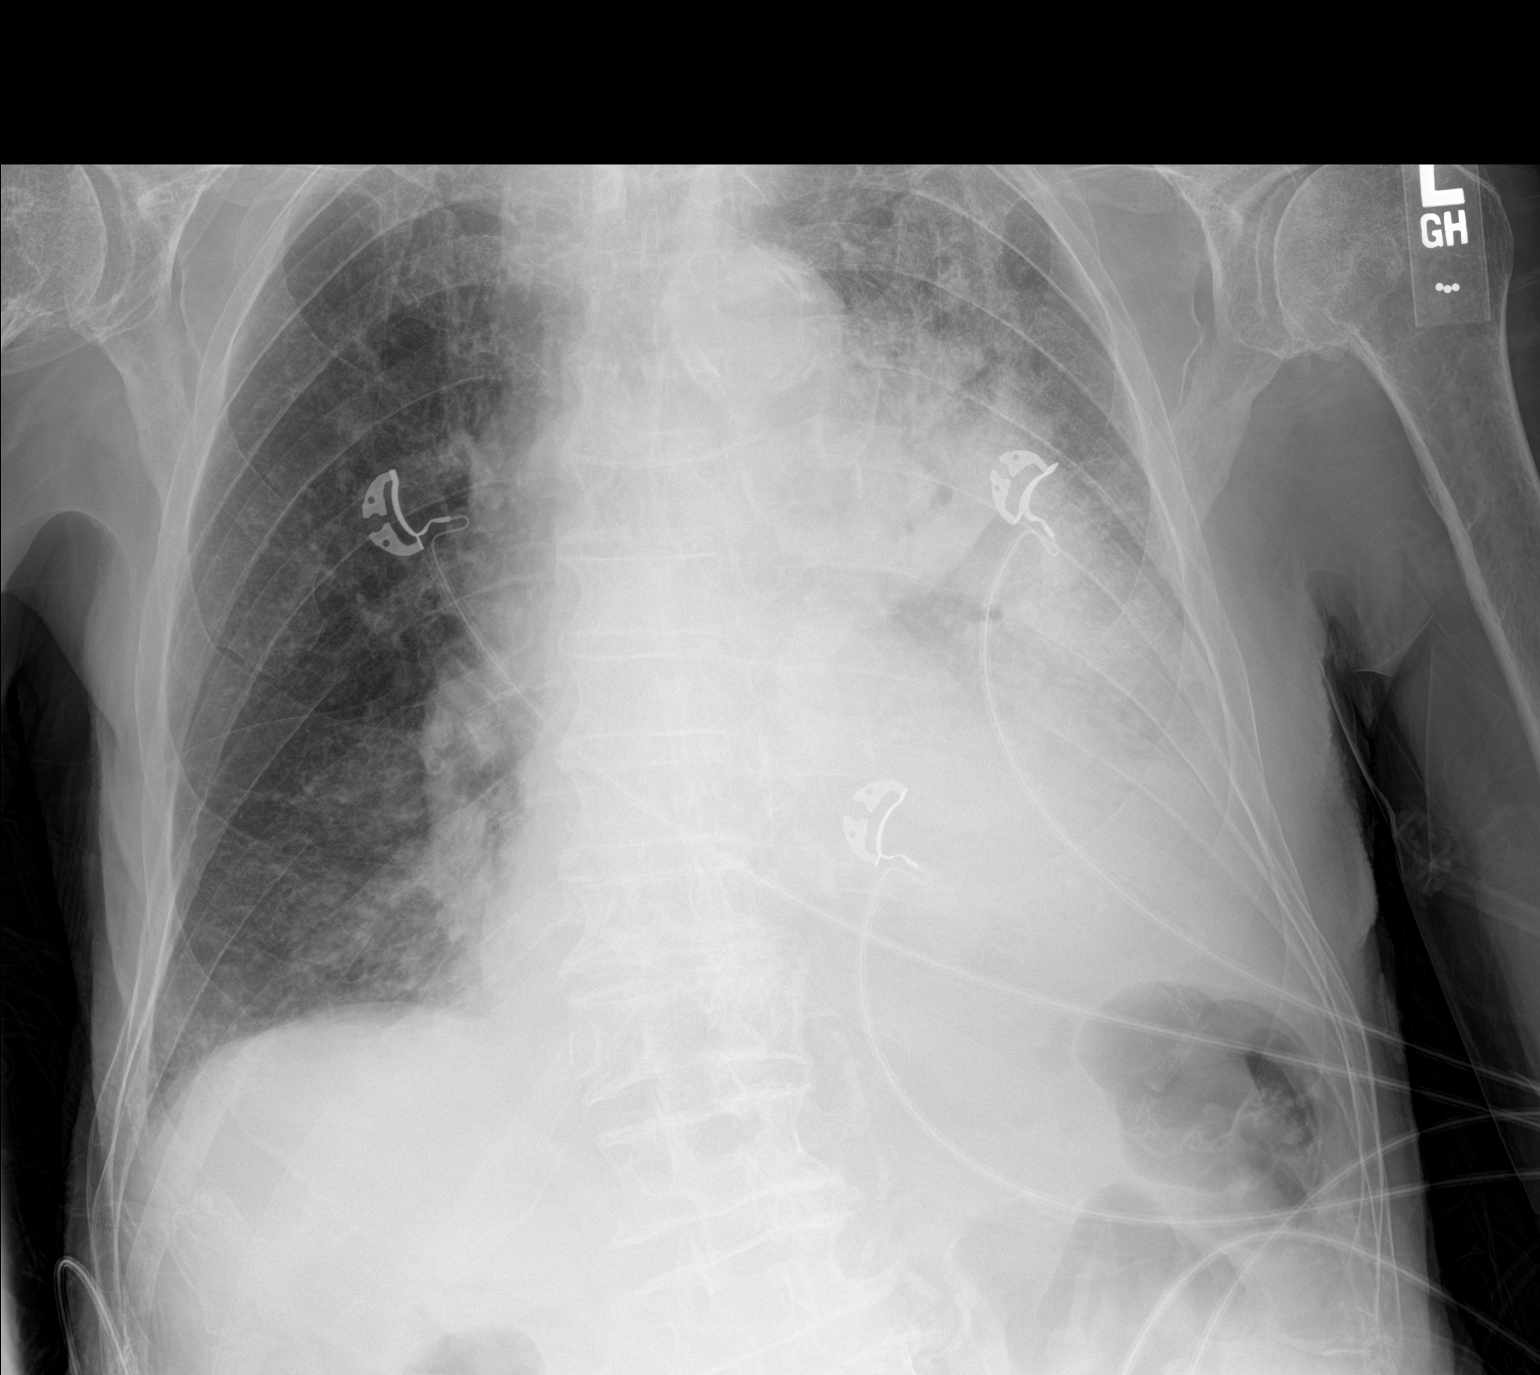

[1 of 1 positions shown; findings below may reference images not displayed]

FINDINGS: Stable cardiomegaly. Atherosclerotic calcification of the aortic
knob. Marked progression of airspace opacity throughout the left
lung, most pronounced within the left perihilar and left basilar
region. Suspect small left pleural effusion. Mild streaky right
basilar opacity, also slightly increased. No pneumothorax.
IMPRESSION: Marked progression of airspace opacity throughout the left lung,
most pronounced within the left perihilar and left basilar regions.
Findings concerning for worsening multifocal pneumonia and/or
asymmetric edema. Acute respiratory distress syndrome is also a
consideration given the rapid progression, although findings are
more asymmetric than would be typically seen in the setting of ARDS.
